# Patient Record
Sex: Female | Born: 1935 | Race: Black or African American | Hispanic: No | Smoking: Never smoker
Health system: Southern US, Community
[De-identification: ages and names within clinical notes are randomized; demographics above are authoritative.]

## PROBLEM LIST (undated history)

## (undated) DIAGNOSIS — K279 Peptic ulcer, site unspecified, unspecified as acute or chronic, without hemorrhage or perforation: Secondary | ICD-10-CM

## (undated) DIAGNOSIS — J449 Chronic obstructive pulmonary disease, unspecified: Secondary | ICD-10-CM

## (undated) DIAGNOSIS — I079 Rheumatic tricuspid valve disease, unspecified: Secondary | ICD-10-CM

## (undated) DIAGNOSIS — M1990 Unspecified osteoarthritis, unspecified site: Secondary | ICD-10-CM

## (undated) DIAGNOSIS — M069 Rheumatoid arthritis, unspecified: Secondary | ICD-10-CM

## (undated) DIAGNOSIS — I059 Rheumatic mitral valve disease, unspecified: Secondary | ICD-10-CM

## (undated) DIAGNOSIS — H409 Unspecified glaucoma: Secondary | ICD-10-CM

## (undated) HISTORY — DX: Unspecified glaucoma: H40.9

## (undated) HISTORY — DX: Rheumatic mitral valve disease, unspecified: I05.9

## (undated) HISTORY — DX: Chronic obstructive pulmonary disease, unspecified: J44.9

## (undated) HISTORY — DX: Unspecified osteoarthritis, unspecified site: M19.90

## (undated) HISTORY — DX: Rheumatoid arthritis, unspecified: M06.9

## (undated) HISTORY — DX: Peptic ulcer, site unspecified, unspecified as acute or chronic, without hemorrhage or perforation: K27.9

## (undated) HISTORY — DX: Rheumatic tricuspid valve disease, unspecified: I07.9

---

## 1969-05-20 HISTORY — PX: ABDOMINAL HYSTERECTOMY: SHX81

## 1979-01-19 HISTORY — PX: HAND SURGERY: SHX662

## 2001-06-22 ENCOUNTER — Ambulatory Visit (HOSPITAL_COMMUNITY): Admission: RE | Admit: 2001-06-22 | Discharge: 2001-06-22 | Payer: Self-pay | Admitting: Family Medicine

## 2001-06-22 ENCOUNTER — Encounter: Payer: Self-pay | Admitting: Family Medicine

## 2001-06-30 ENCOUNTER — Ambulatory Visit (HOSPITAL_COMMUNITY): Admission: RE | Admit: 2001-06-30 | Discharge: 2001-06-30 | Payer: Self-pay | Admitting: Internal Medicine

## 2002-07-26 ENCOUNTER — Encounter: Payer: Self-pay | Admitting: Family Medicine

## 2002-07-26 ENCOUNTER — Inpatient Hospital Stay (HOSPITAL_COMMUNITY): Admission: AD | Admit: 2002-07-26 | Discharge: 2002-07-28 | Payer: Self-pay | Admitting: Family Medicine

## 2002-07-26 ENCOUNTER — Ambulatory Visit (HOSPITAL_COMMUNITY): Admission: RE | Admit: 2002-07-26 | Discharge: 2002-07-26 | Payer: Self-pay | Admitting: Family Medicine

## 2002-10-07 ENCOUNTER — Ambulatory Visit (HOSPITAL_COMMUNITY): Admission: RE | Admit: 2002-10-07 | Discharge: 2002-10-07 | Payer: Self-pay | Admitting: Family Medicine

## 2002-10-07 ENCOUNTER — Encounter: Payer: Self-pay | Admitting: Family Medicine

## 2004-05-28 ENCOUNTER — Ambulatory Visit (HOSPITAL_COMMUNITY): Admission: RE | Admit: 2004-05-28 | Discharge: 2004-05-28 | Payer: Self-pay | Admitting: Family Medicine

## 2010-06-13 LAB — BASIC METABOLIC PANEL
BUN: 9 mg/dL (ref 6–23)
CO2: 28 mEq/L (ref 19–32)
Calcium: 9.6 mg/dL (ref 8.4–10.5)
Chloride: 102 mEq/L (ref 96–112)
Creatinine, Ser: 0.91 mg/dL (ref 0.4–1.2)
GFR calc Af Amer: >60 mL/min (ref >60)
GFR calc non Af Amer: >60 mL/min (ref >60)
Glucose, Bld: 106 mg/dL — ABNORMAL HIGH (ref 70–99)
Potassium: 3.7 mEq/L (ref 3.5–5.1)
Sodium: 137 mEq/L (ref 135–145)

## 2010-06-13 LAB — HEMOGLOBIN AND HEMATOCRIT, BLOOD
HCT: 40.6 % (ref 36.0–46.0)
Hemoglobin: 13.7 g/dL (ref 12.0–15.0)

## 2010-06-21 ENCOUNTER — Ambulatory Visit (HOSPITAL_COMMUNITY)
Admission: RE | Admit: 2010-06-21 | Discharge: 2010-06-21 | Disposition: A | Discharge status: Home / Self Care | Payer: Medicare Other | Attending: Ophthalmology | Admitting: Ophthalmology

## 2010-06-21 DIAGNOSIS — H2510 Age-related nuclear cataract, unspecified eye: Secondary | ICD-10-CM | POA: Insufficient documentation

## 2010-06-21 DIAGNOSIS — Z01818 Encounter for other preprocedural examination: Secondary | ICD-10-CM | POA: Insufficient documentation

## 2010-09-20 ENCOUNTER — Other Ambulatory Visit (HOSPITAL_COMMUNITY): Payer: Self-pay | Admitting: Family Medicine

## 2010-09-20 ENCOUNTER — Ambulatory Visit (HOSPITAL_COMMUNITY)
Admission: RE | Admit: 2010-09-20 | Discharge: 2010-09-20 | Disposition: A | Discharge status: Home / Self Care | Payer: Medicare Other | Source: Ambulatory Visit | Attending: Family Medicine | Admitting: Family Medicine

## 2010-09-20 DIAGNOSIS — M79609 Pain in unspecified limb: Secondary | ICD-10-CM | POA: Insufficient documentation

## 2010-09-20 DIAGNOSIS — R52 Pain, unspecified: Secondary | ICD-10-CM

## 2010-09-20 DIAGNOSIS — M899 Disorder of bone, unspecified: Secondary | ICD-10-CM | POA: Insufficient documentation

## 2010-09-20 DIAGNOSIS — M949 Disorder of cartilage, unspecified: Secondary | ICD-10-CM | POA: Insufficient documentation

## 2010-09-20 DIAGNOSIS — M25569 Pain in unspecified knee: Secondary | ICD-10-CM | POA: Insufficient documentation

## 2010-09-25 ENCOUNTER — Other Ambulatory Visit (HOSPITAL_COMMUNITY): Payer: Self-pay | Admitting: Family Medicine

## 2010-10-01 ENCOUNTER — Ambulatory Visit (HOSPITAL_COMMUNITY)
Admission: RE | Admit: 2010-10-01 | Discharge: 2010-10-01 | Disposition: A | Discharge status: Home / Self Care | Payer: Medicare Other | Source: Ambulatory Visit | Attending: Family Medicine | Admitting: Family Medicine

## 2010-10-01 DIAGNOSIS — M899 Disorder of bone, unspecified: Secondary | ICD-10-CM | POA: Insufficient documentation

## 2010-10-05 NOTE — Group Therapy Note (Signed)
   NAME:  Cheryl Swanson, Cheryl Swanson                          ACCOUNT NO.:  [id number]   MEDICAL RECORD NO.:  [id number]                   PATIENT TYPE:  INP   LOCATION:  A227                                 FACILITY:  [facility]   PHYSICIAN:  Mila Homer. Sudie Bailey, M.D.           DATE OF BIRTH:  01/16/1932   DATE OF PROCEDURE:  07/27/2002  DATE OF DISCHARGE:                                   PROGRESS NOTE   SUBJECTIVE:  The patient feels a lot better today.  She slept a lot better  last night.  She was admitted yesterday from the office with pneumonia.   OBJECTIVE:  She is supine in bed, no acute distress, well-developed but  thin.  She is oriented and alert.  Today, the temperature is 99.9, pulse 90,  respiratory rate 16, and blood pressure 93/57.  Heart has a regular rhythm,  rate of about 110 to my auscultation.  The lungs are clear throughout,  moving air well.  No intercostal retractions, no use of accessory muscles of  respirations.  She is actually off O2 at present.  The abdomen is soft  without hepatosplenomegaly or mass, no tenderness, and there is no edema of  the ankles.   ASSESSMENT:  Pneumonia, improved.   PLAN:  Discontinue IV, switch to Hep-Lock.  The patient to be up in a chair.  Recheck a CBC and MET-7 in the morning.  Continue the Rocephin 1 gram IV and  the Zithromax, now 250 mg p.o. daily, and hope to discharge home tomorrow.                                               Mila Homer. Sudie Bailey, M.D.    SDK/MEDQ  D:  07/27/2002  T:  07/27/2002  Job:  [id number]

## 2010-10-05 NOTE — Discharge Summary (Signed)
   NAME:  Cheryl Swanson, Cheryl Swanson                          ACCOUNT NO.:  [id number]   MEDICAL RECORD NO.:  [id number]                   PATIENT TYPE:  INP   LOCATION:  A227                                 FACILITY:  [facility]   PHYSICIAN:  Mila Homer. Sudie Bailey, M.D.           DATE OF BIRTH:  [birth year]   DATE OF ADMISSION:  07/26/2002  DATE OF DISCHARGE:                                 DISCHARGE SUMMARY   HISTORY OF PRESENT ILLNESS:  This 77 year old woman was admitted to the  hospital with pneumonia on 07/26/2002.  She had a benign 3-day  hospitalization.   She has had fever, chills and cough prior to hospitalization and was found  to have bilateral lower lobe infiltrates on an x-ray and a white cell count  of 19,600.   HOSPITAL COURSE:  In the hospital she was given Zithromax 500 mg immediately  followed by 250 mg p.o. daily and Rocephin 1 gm IV q.24h.  She was on an IV  of D5-1/2 normal saline at 75 mL an hour with albuterol nebulizer treatments  q.4h. while awake.  O2 at 2 liters a minute by nasal prong.  MV daily and  Tylenol 650 q.4h. p.r.n.   She did well on this regimen and was up around the second day and by the  third day was eating well, up and around, and did not need oxygen and felt  much stronger; and was ready for discharge home.   FINAL DISCHARGE DIAGNOSES:  1. Pneumonia.  2. Rheumatoid arthritis, stable on methotrexate weekly.  3. Hyperlipidemia.  4. Estrogen replacement.   DISCHARGE MEDICATIONS:  1. He was discharged home on Ceftin 250 b.i.d. for 7 days (14 no refills).  2. Zithromax 250 mg p.o. for 3 days (no refills).  3. She is to continue her Zocor, Vista, and methotrexate.   DISCHARGE INSTRUCTIONS:  Will follow up in the office in 5 days.                                               Mila Homer. Sudie Bailey, M.D.    SDK/MEDQ  D:  07/28/2002  T:  07/28/2002  Job:  [id number]

## 2010-10-05 NOTE — H&P (Signed)
   NAME:  Cheryl Swanson, LEUGERS                          ACCOUNT NO.:  [id number]   MEDICAL RECORD NO.:  [id number]                   PATIENT TYPE:  INP   LOCATION:  A227                                 FACILITY:  [facility]   PHYSICIAN:  Mila Homer. Sudie Bailey, M.D.           DATE OF BIRTH:  1931-10-21   DATE OF ADMISSION:  07/26/2002  DATE OF DISCHARGE:                                HISTORY & PHYSICAL   HISTORY OF PRESENT ILLNESS:  A 77 year old who presented to the office with  some coughing and dyspnea.  She had had problems sleeping the night before.   She had been seen in the office the week before with what looked like a  viral illness with a clear rhinorrhea and some upper respiratory symptoms.  She had been put on some antihistamine but had gotten worse.   She has a long history of rheumatoid arthritis for which she is currently on  methotrexate weekly.  This has controlled the arthritis.  She has a history  of peptic ulcer disease but turned out to be positive for H. pylori and that  was treated, and she has done well with this.   PHYSICAL EXAMINATION:  GENERAL:  Admission exam in the office showed a  pleasant elderly woman who looked very fatigued.  She had positive ________  sign.  She was oriented and alert.  HEART:  Regular rhythm, rate of about 120, no murmurs.  LUNGS:  Appeared clear throughout, moving air well.  There were no  intercostal retractions, no use of accessory muscles of respiration.  She  had no _________ or adenopathy.  HEENT:  Pharynx was normal.  There are negative skin nodes.  There is no  maxillary or frontal sinus pain.  ABDOMEN:  Soft without hepatosplenomegaly or mass.  EXTREMITIES:  There is no edema of the ankles.  SKIN:  Skin turgor appeared to be normal; mucous membranes slightly dry.   LABORATORY DATA:  Chest x-ray showed bilateral lower infiltrates.  White  cell count was 19,600.   ASSESSMENT:  1. Pneumonia.  2. Rheumatoid arthritis, stable on  methotrexate.   PLAN:  The plan of treatment is to admit the patient to the hospital on O2,  IV fluids, and Rocephin 1 gram IV q.24h. with Zithromax 500 mg immediately,  followed by 250 mg daily for four days.  She will be on Tylenol for fever.                                               Mila Homer. Sudie Bailey, M.D.    SDK/MEDQ  D:  07/27/2002  T:  07/27/2002  Job:  [id number]

## 2010-10-18 ENCOUNTER — Other Ambulatory Visit: Payer: Self-pay | Admitting: Anesthesiology

## 2010-10-18 ENCOUNTER — Encounter (HOSPITAL_COMMUNITY): Payer: Medicare Other

## 2010-10-18 LAB — BASIC METABOLIC PANEL
BUN: 14 mg/dL (ref 6–23)
CO2: 32 mEq/L (ref 19–32)
Chloride: 104 mEq/L (ref 96–112)
Creatinine, Ser: 0.85 mg/dL (ref 0.4–1.2)

## 2010-10-25 ENCOUNTER — Ambulatory Visit (HOSPITAL_COMMUNITY)
Admission: RE | Admit: 2010-10-25 | Discharge: 2010-10-25 | Disposition: A | Discharge status: Home / Self Care | Payer: Medicare Other | Source: Ambulatory Visit | Attending: Ophthalmology | Admitting: Ophthalmology

## 2010-10-25 DIAGNOSIS — Z01818 Encounter for other preprocedural examination: Secondary | ICD-10-CM | POA: Insufficient documentation

## 2010-10-25 DIAGNOSIS — Z01812 Encounter for preprocedural laboratory examination: Secondary | ICD-10-CM | POA: Insufficient documentation

## 2010-10-25 DIAGNOSIS — H2589 Other age-related cataract: Secondary | ICD-10-CM | POA: Insufficient documentation

## 2010-10-29 NOTE — Op Note (Signed)
  NAME:  Cheryl Swanson, CLINKSCALE NO.:  [id number]  MEDICAL RECORD NO.:  [id number]  LOCATION:  DAYP                          FACILITY:  [facility]  PHYSICIAN:  Susanne Greenhouse, MD       DATE OF BIRTH:  1932/03/26  DATE OF PROCEDURE:  10/25/2010 DATE OF DISCHARGE:                              OPERATIVE REPORT   PREOPERATIVE DIAGNOSIS:  Combined cataract, right eye.  Diagnosis code 366.19.  POSTOPERATIVE DIAGNOSIS:  Combined cataract, right eye.  Diagnosis code 366.19.  OPERATION PERFORMED:  Phacoemulsification with posterior chamber intraocular lens implantation, right eye.  SURGEON:  Bonne Dolores. Elchonon Maxson, MD  ANESTHESIA:  General endotracheal anesthesia.  OPERATIVE SUMMARY:  In the preoperative area, dilating drops were placed into the right eye.  The patient was then brought into the operating room where she was placed under general anesthesia.  The eye was then prepped and draped.  Beginning with a 75 blade, a paracentesis port was made at the surgeon's 2 o'clock position.  The anterior chamber was then filled with a 1% nonpreserved lidocaine solution with epinephrine.  This was followed by Viscoat to deepen the chamber.  A small fornix-based peritomy was performed superiorly.  Next, a single iris hook was placed through the limbus superiorly.  A 2.4-mm keratome blade was then used to make a clear corneal incision over the iris hook.  A bent cystotome needle and Utrata forceps were used to create a continuous tear capsulotomy.  Hydrodissection was performed using balanced salt solution on a fine cannula.  The lens nucleus was then removed using phacoemulsification in a quadrant cracking technique.  The cortical material was then removed with irrigation and aspiration.  The capsular bag and anterior chamber were refilled with Provisc.  The wound was widened to approximately 3 mm and a posterior chamber intraocular lens was placed into the capsular bag without difficulty using an  [employer] lens injecting system.  A single 10-0 nylon suture was then used to close the incision as well as stromal hydration.  The Provisc was removed from the anterior chamber and capsular bag with irrigation and aspiration.  At this point, the wounds were tested for leak, which were negative.  The anterior chamber remained deep and stable.  The patient tolerated the procedure well.  There were no operative complications, and she awoke from general anesthesia without problem.  Prosthetic device used is a Lenstec posterior chamber lens, model Softec HD, power of 19.0, serial number is [id number].          ______________________________ Susanne Greenhouse, MD     KEH/MEDQ  D:  10/25/2010  T:  10/26/2010  Job:  [id number]  Electronically Signed by Gemma Payor MD on 10/29/2010 12:22:53 PM

## 2011-02-21 ENCOUNTER — Other Ambulatory Visit (HOSPITAL_COMMUNITY): Payer: Self-pay | Admitting: Family Medicine

## 2011-02-21 ENCOUNTER — Ambulatory Visit (HOSPITAL_COMMUNITY)
Admission: RE | Admit: 2011-02-21 | Discharge: 2011-02-21 | Disposition: A | Discharge status: Home / Self Care | Payer: Medicare Other | Source: Ambulatory Visit | Attending: Family Medicine | Admitting: Family Medicine

## 2011-02-21 DIAGNOSIS — M25569 Pain in unspecified knee: Secondary | ICD-10-CM | POA: Insufficient documentation

## 2011-02-21 DIAGNOSIS — M25561 Pain in right knee: Secondary | ICD-10-CM

## 2011-02-21 DIAGNOSIS — M25562 Pain in left knee: Secondary | ICD-10-CM

## 2011-02-21 DIAGNOSIS — M25469 Effusion, unspecified knee: Secondary | ICD-10-CM | POA: Insufficient documentation

## 2011-10-15 ENCOUNTER — Other Ambulatory Visit (HOSPITAL_COMMUNITY): Payer: Self-pay | Admitting: Family Medicine

## 2011-10-15 ENCOUNTER — Ambulatory Visit (HOSPITAL_COMMUNITY)
Admission: RE | Admit: 2011-10-15 | Discharge: 2011-10-15 | Disposition: A | Discharge status: Home / Self Care | Payer: Medicare Other | Source: Ambulatory Visit | Attending: Family Medicine | Admitting: Family Medicine

## 2011-10-15 DIAGNOSIS — I059 Rheumatic mitral valve disease, unspecified: Secondary | ICD-10-CM

## 2011-10-15 DIAGNOSIS — R5381 Other malaise: Secondary | ICD-10-CM | POA: Insufficient documentation

## 2011-10-15 DIAGNOSIS — R5383 Other fatigue: Secondary | ICD-10-CM | POA: Insufficient documentation

## 2011-10-15 DIAGNOSIS — I079 Rheumatic tricuspid valve disease, unspecified: Secondary | ICD-10-CM | POA: Insufficient documentation

## 2011-10-15 NOTE — Progress Notes (Signed)
*  PRELIMINARY RESULTS* Echocardiogram 2D Echocardiogram has been performed.  Cheryl Swanson 10/15/2011, 1:06 PM

## 2012-03-19 ENCOUNTER — Other Ambulatory Visit (HOSPITAL_COMMUNITY): Payer: Self-pay | Admitting: Family Medicine

## 2012-03-19 ENCOUNTER — Ambulatory Visit (HOSPITAL_COMMUNITY)
Admission: RE | Admit: 2012-03-19 | Discharge: 2012-03-19 | Disposition: A | Discharge status: Home / Self Care | Payer: Medicare Other | Source: Ambulatory Visit | Attending: Family Medicine | Admitting: Family Medicine

## 2012-03-19 DIAGNOSIS — M1710 Unilateral primary osteoarthritis, unspecified knee: Secondary | ICD-10-CM | POA: Insufficient documentation

## 2012-03-19 DIAGNOSIS — M25562 Pain in left knee: Secondary | ICD-10-CM

## 2012-03-19 DIAGNOSIS — M25469 Effusion, unspecified knee: Secondary | ICD-10-CM | POA: Insufficient documentation

## 2012-04-07 ENCOUNTER — Other Ambulatory Visit (HOSPITAL_COMMUNITY): Payer: Self-pay | Admitting: Family Medicine

## 2012-04-07 ENCOUNTER — Ambulatory Visit (HOSPITAL_COMMUNITY)
Admission: RE | Admit: 2012-04-07 | Discharge: 2012-04-07 | Disposition: A | Discharge status: Home / Self Care | Payer: Medicare Other | Source: Ambulatory Visit | Attending: Family Medicine | Admitting: Family Medicine

## 2012-04-07 DIAGNOSIS — J449 Chronic obstructive pulmonary disease, unspecified: Secondary | ICD-10-CM | POA: Insufficient documentation

## 2012-04-07 DIAGNOSIS — R5383 Other fatigue: Secondary | ICD-10-CM

## 2012-04-07 DIAGNOSIS — J4489 Other specified chronic obstructive pulmonary disease: Secondary | ICD-10-CM | POA: Insufficient documentation

## 2012-04-07 DIAGNOSIS — R5381 Other malaise: Secondary | ICD-10-CM | POA: Insufficient documentation

## 2012-04-08 ENCOUNTER — Other Ambulatory Visit (HOSPITAL_COMMUNITY): Payer: Self-pay | Admitting: Family Medicine

## 2012-04-08 DIAGNOSIS — R5383 Other fatigue: Secondary | ICD-10-CM

## 2012-04-08 DIAGNOSIS — R9389 Abnormal findings on diagnostic imaging of other specified body structures: Secondary | ICD-10-CM

## 2012-04-09 ENCOUNTER — Encounter (INDEPENDENT_AMBULATORY_CARE_PROVIDER_SITE_OTHER): Payer: Self-pay

## 2012-04-14 ENCOUNTER — Ambulatory Visit (HOSPITAL_COMMUNITY)
Admission: RE | Admit: 2012-04-14 | Discharge: 2012-04-14 | Disposition: A | Discharge status: Home / Self Care | Payer: Medicare Other | Source: Ambulatory Visit | Attending: Family Medicine | Admitting: Family Medicine

## 2012-04-14 DIAGNOSIS — J438 Other emphysema: Secondary | ICD-10-CM | POA: Insufficient documentation

## 2012-04-14 DIAGNOSIS — R918 Other nonspecific abnormal finding of lung field: Secondary | ICD-10-CM | POA: Insufficient documentation

## 2012-04-14 DIAGNOSIS — R5381 Other malaise: Secondary | ICD-10-CM | POA: Insufficient documentation

## 2012-04-14 DIAGNOSIS — R9389 Abnormal findings on diagnostic imaging of other specified body structures: Secondary | ICD-10-CM

## 2012-04-14 DIAGNOSIS — R5383 Other fatigue: Secondary | ICD-10-CM

## 2012-04-14 MED ORDER — IOHEXOL 300 MG/ML  SOLN
80.0000 mL | Freq: Once | INTRAMUSCULAR | Status: AC | PRN
  Administered 2012-04-14: 80 mL via INTRAVENOUS

## 2012-04-22 ENCOUNTER — Encounter (INDEPENDENT_AMBULATORY_CARE_PROVIDER_SITE_OTHER): Payer: Self-pay

## 2012-06-19 ENCOUNTER — Other Ambulatory Visit (HOSPITAL_COMMUNITY): Payer: Self-pay | Admitting: Family Medicine

## 2012-06-19 ENCOUNTER — Ambulatory Visit (HOSPITAL_COMMUNITY)
Admission: RE | Admit: 2012-06-19 | Discharge: 2012-06-19 | Disposition: A | Discharge status: Home / Self Care | Payer: Medicare Other | Source: Ambulatory Visit | Attending: Family Medicine | Admitting: Family Medicine

## 2012-06-19 DIAGNOSIS — M79604 Pain in right leg: Secondary | ICD-10-CM

## 2012-06-19 DIAGNOSIS — M79609 Pain in unspecified limb: Secondary | ICD-10-CM | POA: Insufficient documentation

## 2012-08-20 ENCOUNTER — Other Ambulatory Visit (HOSPITAL_COMMUNITY): Payer: Self-pay | Admitting: Podiatry

## 2012-08-20 DIAGNOSIS — M79605 Pain in left leg: Secondary | ICD-10-CM

## 2012-08-24 ENCOUNTER — Ambulatory Visit (HOSPITAL_COMMUNITY)
Admission: RE | Admit: 2012-08-24 | Discharge: 2012-08-24 | Disposition: A | Discharge status: Home / Self Care | Payer: Medicare Other | Source: Ambulatory Visit | Attending: Podiatry | Admitting: Podiatry

## 2012-08-24 DIAGNOSIS — M79609 Pain in unspecified limb: Secondary | ICD-10-CM | POA: Insufficient documentation

## 2012-08-24 DIAGNOSIS — M79602 Pain in left arm: Secondary | ICD-10-CM

## 2012-10-28 ENCOUNTER — Encounter: Payer: Self-pay | Admitting: Cardiovascular Disease

## 2012-10-28 ENCOUNTER — Ambulatory Visit (INDEPENDENT_AMBULATORY_CARE_PROVIDER_SITE_OTHER): Payer: Medicare Other | Admitting: Cardiovascular Disease

## 2012-10-28 ENCOUNTER — Ambulatory Visit (HOSPITAL_COMMUNITY)
Admission: RE | Admit: 2012-10-28 | Discharge: 2012-10-28 | Disposition: A | Discharge status: Home / Self Care | Payer: Medicare Other | Source: Ambulatory Visit | Attending: Cardiovascular Disease | Admitting: Cardiovascular Disease

## 2012-10-28 VITALS — BP 128/76 | HR 67 | Resp 16 | Ht 66.0 in | Wt 119.5 lb

## 2012-10-28 DIAGNOSIS — R5381 Other malaise: Secondary | ICD-10-CM

## 2012-10-28 DIAGNOSIS — I4589 Other specified conduction disorders: Secondary | ICD-10-CM

## 2012-10-28 DIAGNOSIS — R5383 Other fatigue: Secondary | ICD-10-CM

## 2012-10-28 DIAGNOSIS — M069 Rheumatoid arthritis, unspecified: Secondary | ICD-10-CM

## 2012-10-29 ENCOUNTER — Encounter: Payer: Self-pay | Admitting: Cardiovascular Disease

## 2012-10-29 DIAGNOSIS — R5382 Chronic fatigue, unspecified: Secondary | ICD-10-CM | POA: Insufficient documentation

## 2012-10-29 DIAGNOSIS — R5383 Other fatigue: Secondary | ICD-10-CM | POA: Insufficient documentation

## 2012-10-29 DIAGNOSIS — M069 Rheumatoid arthritis, unspecified: Secondary | ICD-10-CM | POA: Insufficient documentation

## 2012-10-29 NOTE — Progress Notes (Signed)
Patient ID: Cheryl Swanson, female   DOB: 1931/09/29, 79 y.o.   MRN: [id number]     Reason for office visit Worsening fatigue  Cheryl Swanson is almost 79 years old and has had complaints of gradually worsening fatigue for at least a month. She refers to her condition as feeling "tired all the time". She states that she is tired sooner she wakes up in the morning. She does not describe shortness of breath chest tightness, dizziness, lightheadedness, syncope or other focal nonfocal deficits.  Roughly one year ago she underwent an echocardiogram that showed normal left ventricular size and systolic function with normal regional wall motion and no significant valvular abnormalities. The pulmonary artery pressure was normal there was only mild tricuspid insufficiency. Note is made of grade 1 diastolic dysfunction but upon review of the echocardiogram I believe the Doppler parameters are actually within normal range for an octogenarian.  She is on chronic methotrexate therapy for rheumatoid arthritis and states that she has routine laboratory testing and that she is not anemic. She does not appear to be pale. I do not have access to recent laboratory tests.   No Known Allergies  Current Outpatient Prescriptions  Medication Sig Dispense Refill  . alendronate (FOSAMAX) 70 MG tablet Take 1 tablet by mouth once a week.      . calcium-vitamin D (OSCAL WITH D) 500-200 MG-UNIT per tablet Take 1 tablet by mouth 2 (two) times daily.      . Cholecalciferol (VITAMIN D3) 2000 UNITS TABS Take 1 capsule by mouth daily.      Marland Kitchen gabapentin (NEURONTIN) 300 MG capsule Take 1 capsule by mouth 2 (two) times daily.      . megestrol (MEGACE) 40 MG/ML suspension Take 10 mLs by mouth daily.      . methotrexate (RHEUMATREX) 2.5 MG tablet Take 8 tablets by mouth once a week.      . simvastatin (ZOCOR) 20 MG tablet Take 1 tablet by mouth daily.      . TRAVATAN Z 0.004 % SOLN ophthalmic solution Place 1 drop into both eyes at  bedtime.       No current facility-administered medications for this visit.    Past Medical History  Diagnosis Date  . Rheumatoid arthritis(714.0)   . Peptic ulcer   . Glaucoma   . Mitral valve disorder   . Tricuspid valve disorder     Past Surgical History  Procedure Laterality Date  . Abdominal hysterectomy  1971  . Hand surgery  1980's    left    Family History  Problem Relation Age of Onset  . Cancer Mother   . Cancer Father   . Cancer Brother   . Cancer Brother   . Cancer Son     prostate-remission    History   Social History  . Marital Status: Widowed    Spouse Name: N/A    Number of Children: N/A  . Years of Education: N/A   Occupational History  . Not on file.   Social History Main Topics  . Smoking status: Never Smoker   . Smokeless tobacco: Not on file  . Alcohol Use: No  . Drug Use: No  . Sexually Active: Not on file   Other Topics Concern  . Not on file   Social History Narrative  . No narrative on file    Review of systems: The patient specifically denies any chest pain at rest or with exertion, dyspnea at rest or with exertion, orthopnea, paroxysmal  nocturnal dyspnea, syncope, palpitations, focal neurological deficits, intermittent claudication, lower extremity edema, unexplained weight gain, cough, hemoptysis or wheezing.  The patient also denies abdominal pain, nausea, vomiting, dysphagia, diarrhea, constipation, polyuria, polydipsia, dysuria, hematuria, frequency, urgency, abnormal bleeding or bruising, fever, chills, unexpected weight changes, mood swings, change in skin or hair texture, change in voice quality, auditory or visual problems, allergic reactions or rashes, new musculoskeletal complaints other than usual "aches and pains". She has chronic stiffness and reduced range of motion in the joints of both hands.   PHYSICAL EXAM BP 128/76  Pulse 67  Resp 16  Ht 5\' 6"  (1.676 m)  Wt 119 lb 8 oz (54.205 kg)  BMI 19.3  kg/m2  General: Alert, oriented x3, no distress, she is very slim but states that she has been this way her whole life Head: no evidence of trauma, PERRL, EOMI, no exophtalmos or lid lag, no myxedema, no xanthelasma; normal ears, nose and oropharynx Neck: normal jugular venous pulsations and no hepatojugular reflux; brisk carotid pulses without delay and no carotid bruits Chest: clear to auscultation, no signs of consolidation by percussion or palpation, normal fremitus, symmetrical and full respiratory excursions Cardiovascular: normal position and quality of the apical impulse, regular rhythm, normal first and second heart sounds, no murmurs, rubs or gallops; she has a barely audible pulses: Murmur at the left lower sternal border Abdomen: no tenderness or distention, no masses by palpation, no abnormal pulsatility or arterial bruits, normal bowel sounds, no hepatosplenomegaly Extremities: no clubbing, cyanosis or edema; 2+ radial, ulnar and brachial pulses bilaterally; 2+ right femoral, posterior tibial and dorsalis pedis pulses; 2+ left femoral, posterior tibial and dorsalis pedis pulses; no subclavian or femoral bruits. She has very prominent chronic deformity of the metacarpophalangeal and proximal interphalangeal joints in both hands with ulnar deviation Neurological: grossly nonfocal   EKG: Normal sinus rhythm, normal tracing. There is borderline high-voltage and I believe this is because she is so slender.  Lipid Panel  No results found for this basename: chol, trig, hdl, cholhdl, vldl, ldlcalc    BMET    Component Value Date/Time   NA 140 10/18/2010 1131   K 4.4 10/18/2010 1131   CL 104 10/18/2010 1131   CO2 32 10/18/2010 1131   GLUCOSE 95 10/18/2010 1131   BUN 14 10/18/2010 1131   CREATININE 0.85 10/18/2010 1131   CALCIUM 10.7* 10/18/2010 1131   GFRNONAA >60 10/18/2010 1131   GFRAA  Value: >60        The eGFR has been calculated using the MDRD equation. This calculation has not been  validated in all clinical situations. eGFR's persistently <60 mL/min signify possible Chronic Kidney Disease. 10/18/2010 1131     ASSESSMENT AND PLAN  By clinical exam, fairly recent echocardiogram and ECG there is no evidence to support structural heart disease as a cause of her fatigue.  On the other hand at her age it is possible that she may have chronotropic incompetence as a cause for fatigue. In order to evaluate this we performed an in office simple treadmill stress test. The patient was easily able to increase her heart rate to over 120 beats per minute, therefore clearly not suffering from a tendency to bradycardia. She had a very appropriate response to activity and actually her heart rate increased a little rapidly suggestive of some degree of deconditioning or possibly anemia.  Assuming that her hemoglobin is indeed normal I do not have a clear explanation for her fatigue. I doubt that it  is however related to her cardiovascular problem. I would venture to say that her rheumatological condition and possibly the immunosuppressive therapy for it are the most likely causes for her complaints.  Orders Placed This Encounter  Procedures  . EKG 12-Lead  . Exercise tolerance test   Meds ordered this encounter  Medications  . methotrexate (RHEUMATREX) 2.5 MG tablet    Sig: Take 8 tablets by mouth once a week.  . simvastatin (ZOCOR) 20 MG tablet    Sig: Take 1 tablet by mouth daily.  Marland Kitchen gabapentin (NEURONTIN) 300 MG capsule    Sig: Take 1 capsule by mouth 2 (two) times daily.  . megestrol (MEGACE) 40 MG/ML suspension    Sig: Take 10 mLs by mouth daily.  . TRAVATAN Z 0.004 % SOLN ophthalmic solution    Sig: Place 1 drop into both eyes at bedtime.  Marland Kitchen alendronate (FOSAMAX) 70 MG tablet    Sig: Take 1 tablet by mouth once a week.  . calcium-vitamin D (OSCAL WITH D) 500-200 MG-UNIT per tablet    Sig: Take 1 tablet by mouth 2 (two) times daily.  . Cholecalciferol (VITAMIN D3) 2000 UNITS  TABS    Sig: Take 1 capsule by mouth daily.    Junious Silk, MD, [facility] [phone] office [phone] pager

## 2012-10-30 NOTE — Procedures (Signed)
The patient exercised on a modified Bruce protocol in order to evaluate for signs of chronotropic incompetence.   The baseline heart rate was 72 beats per minutes. The baseline blood pressure is 140/78 mm Hg. The blood pressure peak exercise is 159 of 102 mm Hg.  She achieved a maximum workload of 2.1 minutes. The total exercise time was 2 minutes and 30 seconds. She achieved a peak heart rate of 131 beats per minute representing 92% of the maximum predicted heart rate for age.   The patient did not complain of shortness of breath or chest pain during activity but had severe limited mobility due to arthritis.  No ST segment changes are noticed during exercise or recovery.  Normal stress test. Poor exercise tolerance. No evidence of chronotropic incompetence or coronary insufficiency.

## 2013-03-01 ENCOUNTER — Ambulatory Visit: Payer: Self-pay | Admitting: Podiatry

## 2013-11-18 ENCOUNTER — Other Ambulatory Visit (HOSPITAL_COMMUNITY): Payer: Self-pay | Admitting: Family Medicine

## 2013-11-18 ENCOUNTER — Ambulatory Visit (HOSPITAL_COMMUNITY)
Admission: RE | Admit: 2013-11-18 | Discharge: 2013-11-18 | Disposition: A | Discharge status: Home / Self Care | Payer: Medicare Other | Source: Ambulatory Visit | Attending: Family Medicine | Admitting: Family Medicine

## 2013-11-18 DIAGNOSIS — J438 Other emphysema: Secondary | ICD-10-CM | POA: Insufficient documentation

## 2013-11-18 DIAGNOSIS — R0602 Shortness of breath: Secondary | ICD-10-CM

## 2014-05-17 ENCOUNTER — Other Ambulatory Visit (HOSPITAL_COMMUNITY): Payer: Self-pay | Admitting: Family Medicine

## 2014-05-17 DIAGNOSIS — E559 Vitamin D deficiency, unspecified: Secondary | ICD-10-CM

## 2014-05-17 DIAGNOSIS — M069 Rheumatoid arthritis, unspecified: Secondary | ICD-10-CM

## 2014-05-24 ENCOUNTER — Ambulatory Visit (HOSPITAL_COMMUNITY)
Admission: RE | Admit: 2014-05-24 | Discharge: 2014-05-24 | Disposition: A | Discharge status: Home / Self Care | Payer: Medicare Other | Source: Ambulatory Visit | Attending: Family Medicine | Admitting: Family Medicine

## 2014-05-24 DIAGNOSIS — E559 Vitamin D deficiency, unspecified: Secondary | ICD-10-CM

## 2014-05-24 DIAGNOSIS — Z780 Asymptomatic menopausal state: Secondary | ICD-10-CM | POA: Diagnosis not present

## 2014-05-24 DIAGNOSIS — M8580 Other specified disorders of bone density and structure, unspecified site: Secondary | ICD-10-CM | POA: Insufficient documentation

## 2014-05-24 DIAGNOSIS — M069 Rheumatoid arthritis, unspecified: Secondary | ICD-10-CM | POA: Diagnosis not present

## 2014-05-24 DIAGNOSIS — Z90711 Acquired absence of uterus with remaining cervical stump: Secondary | ICD-10-CM | POA: Insufficient documentation

## 2014-05-24 DIAGNOSIS — Z13820 Encounter for screening for osteoporosis: Secondary | ICD-10-CM | POA: Diagnosis not present

## 2014-06-30 ENCOUNTER — Encounter (HOSPITAL_COMMUNITY)
Admission: RE | Admit: 2014-06-30 | Discharge: 2014-06-30 | Disposition: A | Discharge status: Home / Self Care | Payer: Medicare Other | Source: Ambulatory Visit | Attending: Family Medicine | Admitting: Family Medicine

## 2014-06-30 DIAGNOSIS — M810 Age-related osteoporosis without current pathological fracture: Secondary | ICD-10-CM | POA: Diagnosis not present

## 2014-06-30 MED ORDER — ZOLEDRONIC ACID 5 MG/100ML IV SOLN
5.0000 mg | Freq: Once | INTRAVENOUS | Status: AC
  Administered 2014-06-30: 5 mg via INTRAVENOUS
  Filled 2014-06-30: qty 100

## 2015-07-13 ENCOUNTER — Encounter (HOSPITAL_COMMUNITY): Payer: Self-pay | Admitting: Emergency Medicine

## 2015-07-13 ENCOUNTER — Emergency Department (HOSPITAL_COMMUNITY): Payer: Medicare Other

## 2015-07-13 ENCOUNTER — Emergency Department (HOSPITAL_COMMUNITY)
Admission: EM | Admit: 2015-07-13 | Discharge: 2015-07-13 | Disposition: A | Discharge status: Home / Self Care | Payer: Medicare Other | Attending: Emergency Medicine | Admitting: Emergency Medicine

## 2015-07-13 DIAGNOSIS — Z79899 Other long term (current) drug therapy: Secondary | ICD-10-CM | POA: Diagnosis not present

## 2015-07-13 DIAGNOSIS — R000 Tachycardia, unspecified: Secondary | ICD-10-CM | POA: Diagnosis not present

## 2015-07-13 DIAGNOSIS — Z8679 Personal history of other diseases of the circulatory system: Secondary | ICD-10-CM | POA: Insufficient documentation

## 2015-07-13 DIAGNOSIS — R059 Cough, unspecified: Secondary | ICD-10-CM

## 2015-07-13 DIAGNOSIS — R0981 Nasal congestion: Secondary | ICD-10-CM | POA: Diagnosis not present

## 2015-07-13 DIAGNOSIS — Z8711 Personal history of peptic ulcer disease: Secondary | ICD-10-CM | POA: Insufficient documentation

## 2015-07-13 DIAGNOSIS — R05 Cough: Secondary | ICD-10-CM | POA: Insufficient documentation

## 2015-07-13 DIAGNOSIS — R079 Chest pain, unspecified: Secondary | ICD-10-CM | POA: Diagnosis not present

## 2015-07-13 DIAGNOSIS — J392 Other diseases of pharynx: Secondary | ICD-10-CM | POA: Insufficient documentation

## 2015-07-13 DIAGNOSIS — G8929 Other chronic pain: Secondary | ICD-10-CM | POA: Diagnosis not present

## 2015-07-13 DIAGNOSIS — H409 Unspecified glaucoma: Secondary | ICD-10-CM | POA: Diagnosis not present

## 2015-07-13 DIAGNOSIS — M069 Rheumatoid arthritis, unspecified: Secondary | ICD-10-CM | POA: Diagnosis not present

## 2015-07-13 LAB — INFLUENZA PANEL BY PCR (TYPE A & B)
H1N1FLUPCR: NOT DETECTED
INFLBPCR: POSITIVE — AB
Influenza A By PCR: NEGATIVE

## 2015-07-13 MED ORDER — ALBUTEROL SULFATE (2.5 MG/3ML) 0.083% IN NEBU
5.0000 mg | INHALATION_SOLUTION | Freq: Once | RESPIRATORY_TRACT | Status: AC
  Administered 2015-07-13: 5 mg via RESPIRATORY_TRACT
  Filled 2015-07-13: qty 6

## 2015-07-13 MED ORDER — ALBUTEROL SULFATE HFA 108 (90 BASE) MCG/ACT IN AERS: 2.0000 | INHALATION_SPRAY | RESPIRATORY_TRACT | Status: DC | PRN

## 2015-07-13 MED ORDER — BENZONATATE 100 MG PO CAPS: 100.0000 mg | ORAL_CAPSULE | Freq: Three times a day (TID) | ORAL | Status: DC

## 2015-07-13 MED ORDER — BENZONATATE 100 MG PO CAPS
100.0000 mg | ORAL_CAPSULE | Freq: Once | ORAL | Status: AC
  Administered 2015-07-13: 100 mg via ORAL
  Filled 2015-07-13: qty 1

## 2015-07-13 NOTE — ED Notes (Signed)
Called lab to acquire about flu culture. Lab states their hood has been under maintenance and flu has not been started (however, has been marked in progress). MD notified and family notified of situation. MD to see pt.

## 2015-07-13 NOTE — ED Notes (Signed)
Patient with no complaints at this time. Respirations even and unlabored. Skin warm/dry. Discharge instructions reviewed with patient at this time. Patient given opportunity to voice concerns/ask questions. Patient discharged at this time and left Emergency Department with steady gait.   

## 2015-07-13 NOTE — ED Provider Notes (Signed)
CSN: [id number]     Arrival date & time 07/13/15  1036 History   First MD Initiated Contact with Patient 07/13/15 1046     Chief Complaint  Patient presents with  . Cough     (Consider location/radiation/quality/duration/timing/severity/associated sxs/prior Treatment) HPI Comments: The pt is an 82 y/o female - she has RA and has had 5 days of coughing and now with some congestion.  She reports that she has not had any swelling, there is no new body aches but she does have some chronic pain related to her rheumatoid arthritis. There is some pain that goes across her lower chest on the left and the right and seems to get worse when she coughs. She states that the cough is occasionally productive, denies back pain, neck pain, swelling of the legs, no medications given prior to arrival  Patient is a 82 y.o. female presenting with cough. The history is provided by the patient.  Cough   Past Medical History  Diagnosis Date  . Rheumatoid arthritis(714.0)   . Peptic ulcer   . Glaucoma   . Mitral valve disorder   . Tricuspid valve disorder    Past Surgical History  Procedure Laterality Date  . Abdominal hysterectomy  1971  . Hand surgery  1980's    left   Family History  Problem Relation Age of Onset  . Cancer Mother   . Cancer Father   . Cancer Brother   . Cancer Brother   . Cancer Son     prostate-remission   Social History  Substance Use Topics  . Smoking status: Never Smoker   . Smokeless tobacco: None  . Alcohol Use: No   OB History    No data available     Review of Systems  Respiratory: Positive for cough.   All other systems reviewed and are negative.     Allergies  Review of patient's allergies indicates no known allergies.  Home Medications   Prior to Admission medications   Medication Sig Start Date End Date Taking? Authorizing Provider  alendronate (FOSAMAX) 70 MG tablet Take 1 tablet by mouth once a week. 10/19/12  Yes Historical Provider, MD   calcium-vitamin D (OSCAL WITH D) 500-200 MG-UNIT per tablet Take 1 tablet by mouth 2 (two) times daily.   Yes Historical Provider, MD  Cholecalciferol (VITAMIN D3) 2000 UNITS TABS Take 1 capsule by mouth daily.   Yes Historical Provider, MD  ibuprofen (ADVIL,MOTRIN) 200 MG tablet Take 400 mg by mouth every 6 (six) hours as needed.   Yes Historical Provider, MD  methotrexate (RHEUMATREX) 2.5 MG tablet Take 8 tablets by mouth once a week. 10/05/12  Yes Historical Provider, MD  simvastatin (ZOCOR) 20 MG tablet Take 1 tablet by mouth daily. 10/05/12  Yes Historical Provider, MD  TRAVATAN Z 0.004 % SOLN ophthalmic solution Place 1 drop into both eyes at bedtime. 10/05/12  Yes Historical Provider, MD  benzonatate (TESSALON) 100 MG capsule Take 1 capsule (100 mg total) by mouth every 8 (eight) hours. 07/13/15   Eber Hong, MD   BP 112/71 mmHg  Pulse 94  Temp(Src) 98.6 F (37 C) (Oral)  Resp 22  Ht 5\' 6"  (1.676 m)  Wt 123 lb (55.792 kg)  BMI 19.86 kg/m2  SpO2 94% Physical Exam  Constitutional: She appears well-developed and well-nourished. No distress.  HENT:  Head: Normocephalic and atraumatic.  Mouth/Throat: Oropharynx is clear and moist. No oropharyngeal exudate.  Posterior pharynx mildly erythematous  Eyes: Conjunctivae and EOM are  normal. Pupils are equal, round, and reactive to light. Right eye exhibits no discharge. Left eye exhibits no discharge. No scleral icterus.  Neck: Normal range of motion. Neck supple. No JVD present. No thyromegaly present.  Cardiovascular: Regular rhythm, normal heart sounds and intact distal pulses.  Exam reveals no gallop and no friction rub.   No murmur heard. Mild tachycardia  Pulmonary/Chest: Effort normal and breath sounds normal. No respiratory distress. She has no wheezes. She has no rales.  Abdominal: Soft. Bowel sounds are normal. She exhibits no distension and no mass. There is no tenderness.  Musculoskeletal: Normal range of motion. She exhibits no  edema or tenderness.  Lymphadenopathy:    She has no cervical adenopathy.  Neurological: She is alert. Coordination normal.  Skin: Skin is warm and dry. No rash noted. No erythema.  Psychiatric: She has a normal mood and affect. Her behavior is normal.  Nursing note and vitals reviewed.   ED Course  Procedures (including critical care time) Labs Review Labs Reviewed  INFLUENZA PANEL BY PCR (TYPE A & B, H1N1)    Imaging Review Dg Chest 2 View  07/13/2015  CLINICAL DATA:  Bilateral rib pain, cough and congestion starting Sunday night, no known injury EXAM: CHEST  2 VIEW COMPARISON:  11/18/2013 FINDINGS: Cardiomediastinal silhouette is stable. Stable hyperinflation. Minimal perihilar increased bronchial markings without focal consolidation. No segmental infiltrate or pulmonary edema. Bony thorax is unremarkable. IMPRESSION: Hyperinflation again noted. No acute infiltrate or pulmonary edema. Minimal perihilar increased bronchial markings without focal consolidation. Electronically Signed   By: Natasha Mead M.D.   On: 07/13/2015 11:32   I have personally reviewed and evaluated these images and lab results as part of my medical decision-making.   EKG Interpretation   Date/Time:  Thursday July 13 2015 10:50:34 EST Ventricular Rate:  106 PR Interval:  165 QRS Duration: 92 QT Interval:  340 QTC Calculation: 451 R Axis:   77 Text Interpretation:  Sinus tachycardia Biatrial enlargement Abnrm T,  consider ischemia, anterolateral lds ST elevation, consider inferior  injury Baseline wander in lead(s) II III aVL aVF V6 Since last tracing  rate faster Confirmed by Henleigh Robello  MD, Lanique Gonzalo ([id number]) on 07/13/2015 11:12:51  AM      MDM   Final diagnoses:  Cough    The patient does have some signs of rheumatoid arthritis from her joints however these are chronic findings, she has normal vital signs other than oxygen of 94%. She has normal lung sounds however I will obtain a chest x-ray and a flu  swab to rule out sources of her cough during this busy flu season. She does not appear in distress, she speaks in full sentences, this is not fluid overloaded.  Chest x-rays unremarkable, influenza sample was not run because the lab was unable to run a sample in a timely manner, the patient's vital signs have stayed in a remarkably normal range, her lungs sound normal at this time, she was informed of her x-ray results, she had no change on her EKG, she is more than 5 days into her symptoms thus the flu medication would not be beneficial  Meds given in ED:  Medications  benzonatate (TESSALON) capsule 100 mg (not administered)  albuterol (PROVENTIL) (2.5 MG/3ML) 0.083% nebulizer solution 5 mg (not administered)    New Prescriptions   BENZONATATE (TESSALON) 100 MG CAPSULE    Take 1 capsule (100 mg total) by mouth every 8 (eight) hours.        Arlys John  Hyacinth Meeker, MD 07/13/15 1335

## 2015-07-13 NOTE — Discharge Instructions (Signed)
Your chest x-ray did not show any signs of pneumonia, please take the albuterol inhaler 2 puffs every 4 hours as needed for shortness of breath or coughing, Tessalon every 8 hours as needed for coughing, follow-up with your doctor within 2 days for a recheck or return to the emergency department for severe symptoms

## 2015-07-13 NOTE — ED Notes (Signed)
Pt reports bilateral rib pain,h/a,  cough, and congestion since Sunday night.  Pt alert and oriented.

## 2015-07-13 NOTE — ED Notes (Signed)
Patient given ginger ale per request. Updated on pending flu culture.

## 2018-02-24 ENCOUNTER — Other Ambulatory Visit (HOSPITAL_COMMUNITY): Payer: Self-pay | Admitting: Family Medicine

## 2018-02-24 DIAGNOSIS — R0600 Dyspnea, unspecified: Secondary | ICD-10-CM

## 2018-03-03 ENCOUNTER — Ambulatory Visit (HOSPITAL_COMMUNITY)
Admission: RE | Admit: 2018-03-03 | Discharge: 2018-03-03 | Disposition: A | Discharge status: Home / Self Care | Payer: Medicare Other | Source: Ambulatory Visit | Attending: Family Medicine | Admitting: Family Medicine

## 2018-03-03 DIAGNOSIS — Z8711 Personal history of peptic ulcer disease: Secondary | ICD-10-CM | POA: Insufficient documentation

## 2018-03-03 DIAGNOSIS — R0600 Dyspnea, unspecified: Secondary | ICD-10-CM | POA: Insufficient documentation

## 2018-03-03 DIAGNOSIS — M069 Rheumatoid arthritis, unspecified: Secondary | ICD-10-CM | POA: Diagnosis not present

## 2018-03-03 DIAGNOSIS — I080 Rheumatic disorders of both mitral and aortic valves: Secondary | ICD-10-CM | POA: Diagnosis not present

## 2018-03-03 NOTE — Progress Notes (Signed)
*  PRELIMINARY RESULTS* Echocardiogram 2D Echocardiogram has been performed.  Cheryl Swanson 03/03/2018, 1:48 PM

## 2018-06-10 ENCOUNTER — Other Ambulatory Visit (HOSPITAL_COMMUNITY): Payer: Self-pay | Admitting: Family Medicine

## 2018-06-10 ENCOUNTER — Ambulatory Visit (HOSPITAL_COMMUNITY)
Admission: RE | Admit: 2018-06-10 | Discharge: 2018-06-10 | Disposition: A | Discharge status: Home / Self Care | Payer: Medicare Other | Source: Ambulatory Visit | Attending: Family Medicine | Admitting: Family Medicine

## 2018-06-10 DIAGNOSIS — R5383 Other fatigue: Secondary | ICD-10-CM | POA: Insufficient documentation

## 2018-06-15 ENCOUNTER — Other Ambulatory Visit (HOSPITAL_COMMUNITY): Payer: Self-pay | Admitting: Family Medicine

## 2018-06-15 DIAGNOSIS — I618 Other nontraumatic intracerebral hemorrhage: Secondary | ICD-10-CM

## 2018-06-18 ENCOUNTER — Ambulatory Visit (HOSPITAL_COMMUNITY)
Admission: RE | Admit: 2018-06-18 | Discharge: 2018-06-18 | Disposition: A | Discharge status: Home / Self Care | Payer: Medicare Other | Source: Ambulatory Visit | Attending: Family Medicine | Admitting: Family Medicine

## 2018-06-18 DIAGNOSIS — I618 Other nontraumatic intracerebral hemorrhage: Secondary | ICD-10-CM | POA: Diagnosis present

## 2018-06-18 DIAGNOSIS — I083 Combined rheumatic disorders of mitral, aortic and tricuspid valves: Secondary | ICD-10-CM | POA: Diagnosis not present

## 2018-06-18 NOTE — Progress Notes (Signed)
*  PRELIMINARY RESULTS* Echocardiogram 2D Echocardiogram has been performed.  Stacey Drain 06/18/2018, 12:11 PM

## 2018-06-22 ENCOUNTER — Telehealth: Payer: Self-pay | Admitting: Cardiology

## 2018-06-22 NOTE — Telephone Encounter (Signed)
I am not certain what he means. We did switch to a new reporting system last week but everything should be in the report. What specifically was he looking for?

## 2018-06-22 NOTE — Telephone Encounter (Signed)
To Dr Koneswaran 

## 2018-06-22 NOTE — Telephone Encounter (Signed)
Per Maralyn Sago at Dr. Michelle Nasuti office-- Dr. Sudie Bailey would like for Dr. Purvis Sheffield to re read pt's echo, states that Dr. Sudie Bailey would like it "re-dictated" if possible, states there are some words missing from the report.

## 2018-06-23 NOTE — Telephone Encounter (Signed)
I spoke with Cheryl Swanson with dr.Knowlton.She will speak with Dr.Knowlton and call me back

## 2018-07-09 ENCOUNTER — Encounter: Payer: Self-pay | Admitting: Cardiology

## 2018-07-09 ENCOUNTER — Ambulatory Visit (INDEPENDENT_AMBULATORY_CARE_PROVIDER_SITE_OTHER): Payer: Medicare Other | Admitting: Cardiology

## 2018-07-09 VITALS — BP 124/62 | HR 88 | Ht 66.0 in | Wt 121.0 lb

## 2018-07-09 DIAGNOSIS — R5383 Other fatigue: Secondary | ICD-10-CM

## 2018-07-09 NOTE — Progress Notes (Signed)
Clinical Summary Ms. Erby is a 85 y.o.female seen as new consult, referred for fatigue and reported abnormal EKG at pcp office.   1. Abnormal EKG - ekg from pcp not included in records - EKG today shows NSR, biatrial enlargement, LVH Jan 2020 echo LVEF >65%, grade I diastolic dysfunction   2. Fatigue - no specific cardiopulmonary symptms, primarily generalized fatigue - extensive workup in 2014 by Dr Royann Shivers for similar symptoms with benign echo and GXT at the time - repeat echo Jan 2020 by pcp with normal LVEF, only grade I diastolic dysfunction   3. Rheumatoid arthritis   Past Medical History:  Diagnosis Date  . Glaucoma   . Mitral valve disorder   . Peptic ulcer   . Rheumatoid arthritis(714.0)   . Tricuspid valve disorder      No Known Allergies   Current Outpatient Medications  Medication Sig Dispense Refill  . albuterol (PROVENTIL HFA;VENTOLIN HFA) 108 (90 Base) MCG/ACT inhaler Inhale 2 puffs into the lungs every 4 (four) hours as needed for wheezing or shortness of breath. 1 Inhaler 3  . alendronate (FOSAMAX) 70 MG tablet Take 1 tablet by mouth once a week.    . benzonatate (TESSALON) 100 MG capsule Take 1 capsule (100 mg total) by mouth every 8 (eight) hours. 21 capsule 0  . calcium-vitamin D (OSCAL WITH D) 500-200 MG-UNIT per tablet Take 1 tablet by mouth 2 (two) times daily.    . Cholecalciferol (VITAMIN D3) 2000 UNITS TABS Take 1 capsule by mouth daily.    Marland Kitchen ibuprofen (ADVIL,MOTRIN) 200 MG tablet Take 400 mg by mouth every 6 (six) hours as needed.    . methotrexate (RHEUMATREX) 2.5 MG tablet Take 8 tablets by mouth once a week.    . simvastatin (ZOCOR) 20 MG tablet Take 1 tablet by mouth daily.    . TRAVATAN Z 0.004 % SOLN ophthalmic solution Place 1 drop into both eyes at bedtime.     No current facility-administered medications for this visit.      Past Surgical History:  Procedure Laterality Date  . ABDOMINAL HYSTERECTOMY  1971  . HAND SURGERY   1980's   left     No Known Allergies    Family History  Problem Relation Age of Onset  . Cancer Mother   . Cancer Father   . Cancer Brother   . Cancer Brother   . Cancer Son        prostate-remission     Social History Ms. Keune reports that she has never smoked. She does not have any smokeless tobacco history on file. Ms. Pagett reports no history of alcohol use.   Review of Systems CONSTITUTIONAL: +fatigue HEENT: Eyes: No visual loss, blurred vision, double vision or yellow sclerae.No hearing loss, sneezing, congestion, runny nose or sore throat.  SKIN: No rash or itching.  CARDIOVASCULAR: per hpi RESPIRATORY: No shortness of breath, cough or sputum.  GASTROINTESTINAL: No anorexia, nausea, vomiting or diarrhea. No abdominal pain or blood.  GENITOURINARY: No burning on urination, no polyuria NEUROLOGICAL: No headache, dizziness, syncope, paralysis, ataxia, numbness or tingling in the extremities. No change in bowel or bladder control.  MUSCULOSKELETAL: No muscle, back pain, joint pain or stiffness.  LYMPHATICS: No enlarged nodes. No history of splenectomy.  PSYCHIATRIC: No history of depression or anxiety.  ENDOCRINOLOGIC: No reports of sweating, cold or heat intolerance. No polyuria or polydipsia.  Marland Kitchen   Physical Examination Vitals:   07/09/18 0826 07/09/18 0832  BP: 130/66  124/62  Pulse: 88   SpO2: 98%    Vitals:   07/09/18 0826  Weight: 121 lb (54.9 kg)  Height: 5\' 6"  (1.676 m)    Gen: resting comfortably, no acute distress HEENT: no scleral icterus, pupils equal round and reactive, no palptable cervical adenopathy,  CV: RRR, no m/r/g, no jvd Resp: Clear to auscultation bilaterally GI: abdomen is soft, non-tender, non-distended, normal bowel sounds, no hepatosplenomegaly MSK: extremities are warm, no edema.  Skin: warm, no rash Neuro:  no focal deficits Psych: appropriate affect     Assessment and Plan  1. Fatigue - no specific cardiopulmonary  symptmos - extensive workup in 2014 for similar symptoms were benign. Recent echo overall benign. No symptoms to suggest ischemia, would not recommend stress testing - overall nonspecific generalized fatigue without evidence of cardiac component, perhaps related to her rheumatoid arthritits, deconditioning  No further cardiac testing planned, may f/u as needed      Antoine Poche, M.D.

## 2018-07-09 NOTE — Patient Instructions (Signed)
Medication Instructions:  Your physician recommends that you continue on your current medications as directed. Please refer to the Current Medication list given to you today.   Labwork: NONE  Testing/Procedures: NONE  Follow-Up: Your physician recommends that you schedule a follow-up appointment in: AS NEEDED      Any Other Special Instructions Will Be Listed Below (If Applicable).     If you need a refill on your cardiac medications before your next appointment, please call your pharmacy.   

## 2019-02-02 ENCOUNTER — Other Ambulatory Visit (HOSPITAL_COMMUNITY): Payer: Self-pay | Admitting: Family Medicine

## 2019-02-02 DIAGNOSIS — R609 Edema, unspecified: Secondary | ICD-10-CM

## 2019-02-02 DIAGNOSIS — M79604 Pain in right leg: Secondary | ICD-10-CM

## 2019-02-03 ENCOUNTER — Ambulatory Visit (HOSPITAL_COMMUNITY)
Admission: RE | Admit: 2019-02-03 | Discharge: 2019-02-03 | Disposition: A | Discharge status: Home / Self Care | Payer: Medicare Other | Source: Ambulatory Visit | Attending: Family Medicine | Admitting: Family Medicine

## 2019-02-03 ENCOUNTER — Other Ambulatory Visit: Payer: Self-pay

## 2019-02-03 DIAGNOSIS — R609 Edema, unspecified: Secondary | ICD-10-CM | POA: Diagnosis present

## 2019-02-03 DIAGNOSIS — M79604 Pain in right leg: Secondary | ICD-10-CM

## 2019-02-05 ENCOUNTER — Other Ambulatory Visit (HOSPITAL_COMMUNITY)
Admission: RE | Admit: 2019-02-05 | Discharge: 2019-02-05 | Disposition: A | Discharge status: Home / Self Care | Payer: Medicare Other | Source: Ambulatory Visit | Attending: Family Medicine | Admitting: Family Medicine

## 2019-02-05 DIAGNOSIS — Z7901 Long term (current) use of anticoagulants: Secondary | ICD-10-CM | POA: Insufficient documentation

## 2019-02-05 LAB — PROTIME-INR
INR: 1.1 (ref 0.8–1.2)
Prothrombin Time: 13.9 seconds (ref 11.4–15.2)

## 2019-02-08 ENCOUNTER — Other Ambulatory Visit (HOSPITAL_COMMUNITY)
Admission: RE | Admit: 2019-02-08 | Discharge: 2019-02-08 | Disposition: A | Discharge status: Home / Self Care | Payer: Medicare Other | Source: Ambulatory Visit | Attending: Nurse Practitioner | Admitting: Nurse Practitioner

## 2019-02-08 DIAGNOSIS — Z7901 Long term (current) use of anticoagulants: Secondary | ICD-10-CM | POA: Diagnosis not present

## 2019-02-08 DIAGNOSIS — I82409 Acute embolism and thrombosis of unspecified deep veins of unspecified lower extremity: Secondary | ICD-10-CM | POA: Insufficient documentation

## 2019-02-08 LAB — PROTIME-INR
INR: 2.2 — ABNORMAL HIGH (ref 0.8–1.2)
Prothrombin Time: 23.8 seconds — ABNORMAL HIGH (ref 11.4–15.2)

## 2019-02-12 ENCOUNTER — Other Ambulatory Visit (HOSPITAL_COMMUNITY)
Admission: RE | Admit: 2019-02-12 | Discharge: 2019-02-12 | Disposition: A | Discharge status: Home / Self Care | Payer: Medicare Other | Source: Ambulatory Visit | Attending: Nurse Practitioner | Admitting: Nurse Practitioner

## 2019-02-12 DIAGNOSIS — Z7901 Long term (current) use of anticoagulants: Secondary | ICD-10-CM | POA: Diagnosis present

## 2019-02-12 DIAGNOSIS — I82401 Acute embolism and thrombosis of unspecified deep veins of right lower extremity: Secondary | ICD-10-CM | POA: Diagnosis present

## 2019-02-12 LAB — PROTIME-INR
INR: 3.2 — ABNORMAL HIGH (ref 0.8–1.2)
Prothrombin Time: 32.6 seconds — ABNORMAL HIGH (ref 11.4–15.2)

## 2019-02-15 ENCOUNTER — Other Ambulatory Visit (HOSPITAL_COMMUNITY)
Admission: RE | Admit: 2019-02-15 | Discharge: 2019-02-15 | Disposition: A | Discharge status: Home / Self Care | Payer: Medicare Other | Source: Ambulatory Visit | Attending: Family Medicine | Admitting: Family Medicine

## 2019-02-15 DIAGNOSIS — I82401 Acute embolism and thrombosis of unspecified deep veins of right lower extremity: Secondary | ICD-10-CM | POA: Diagnosis present

## 2019-02-15 LAB — PROTIME-INR
INR: 4.9 (ref 0.8–1.2)
Prothrombin Time: 44.5 seconds — ABNORMAL HIGH (ref 11.4–15.2)

## 2019-02-17 ENCOUNTER — Other Ambulatory Visit (HOSPITAL_COMMUNITY)
Admission: RE | Admit: 2019-02-17 | Discharge: 2019-02-17 | Disposition: A | Discharge status: Home / Self Care | Payer: Medicare Other | Source: Ambulatory Visit | Attending: Family Medicine | Admitting: Family Medicine

## 2019-02-17 DIAGNOSIS — Z7901 Long term (current) use of anticoagulants: Secondary | ICD-10-CM | POA: Diagnosis present

## 2019-02-17 DIAGNOSIS — I82401 Acute embolism and thrombosis of unspecified deep veins of right lower extremity: Secondary | ICD-10-CM | POA: Diagnosis present

## 2019-02-17 LAB — PROTIME-INR
INR: 4.3 (ref 0.8–1.2)
Prothrombin Time: 40.2 seconds — ABNORMAL HIGH (ref 11.4–15.2)

## 2019-02-19 ENCOUNTER — Other Ambulatory Visit (HOSPITAL_COMMUNITY)
Admission: RE | Admit: 2019-02-19 | Discharge: 2019-02-19 | Disposition: A | Discharge status: Home / Self Care | Payer: Medicare Other | Source: Ambulatory Visit | Attending: Family Medicine | Admitting: Family Medicine

## 2019-02-19 DIAGNOSIS — Z7901 Long term (current) use of anticoagulants: Secondary | ICD-10-CM | POA: Insufficient documentation

## 2019-02-19 DIAGNOSIS — I82401 Acute embolism and thrombosis of unspecified deep veins of right lower extremity: Secondary | ICD-10-CM | POA: Insufficient documentation

## 2019-02-19 LAB — PROTIME-INR
INR: 2.2 — ABNORMAL HIGH (ref 0.8–1.2)
Prothrombin Time: 23.9 seconds — ABNORMAL HIGH (ref 11.4–15.2)

## 2019-02-23 ENCOUNTER — Other Ambulatory Visit (HOSPITAL_COMMUNITY)
Admission: RE | Admit: 2019-02-23 | Discharge: 2019-02-23 | Disposition: A | Discharge status: Home / Self Care | Payer: Medicare Other | Source: Ambulatory Visit | Attending: Family Medicine | Admitting: Family Medicine

## 2019-02-23 DIAGNOSIS — Z7901 Long term (current) use of anticoagulants: Secondary | ICD-10-CM | POA: Insufficient documentation

## 2019-02-23 LAB — PROTIME-INR
INR: 1.7 — ABNORMAL HIGH (ref 0.8–1.2)
Prothrombin Time: 19.6 seconds — ABNORMAL HIGH (ref 11.4–15.2)

## 2019-03-02 ENCOUNTER — Other Ambulatory Visit: Payer: Self-pay

## 2019-03-02 ENCOUNTER — Other Ambulatory Visit (HOSPITAL_COMMUNITY)
Admission: RE | Admit: 2019-03-02 | Discharge: 2019-03-02 | Disposition: A | Discharge status: Home / Self Care | Payer: Medicare Other | Source: Ambulatory Visit | Attending: Family Medicine | Admitting: Family Medicine

## 2019-03-02 DIAGNOSIS — Z7901 Long term (current) use of anticoagulants: Secondary | ICD-10-CM | POA: Insufficient documentation

## 2019-03-02 LAB — PROTIME-INR
INR: 2.5 — ABNORMAL HIGH (ref 0.8–1.2)
Prothrombin Time: 26.3 seconds — ABNORMAL HIGH (ref 11.4–15.2)

## 2019-03-16 ENCOUNTER — Other Ambulatory Visit (HOSPITAL_COMMUNITY)
Admission: RE | Admit: 2019-03-16 | Discharge: 2019-03-16 | Disposition: A | Discharge status: Home / Self Care | Payer: Medicare Other | Source: Ambulatory Visit | Attending: Family Medicine | Admitting: Family Medicine

## 2019-03-16 DIAGNOSIS — I82401 Acute embolism and thrombosis of unspecified deep veins of right lower extremity: Secondary | ICD-10-CM | POA: Insufficient documentation

## 2019-03-16 DIAGNOSIS — Z7901 Long term (current) use of anticoagulants: Secondary | ICD-10-CM | POA: Diagnosis present

## 2019-03-16 LAB — PROTIME-INR
INR: 2.5 — ABNORMAL HIGH (ref 0.8–1.2)
Prothrombin Time: 26.9 seconds — ABNORMAL HIGH (ref 11.4–15.2)

## 2019-04-13 ENCOUNTER — Other Ambulatory Visit (HOSPITAL_COMMUNITY)
Admission: RE | Admit: 2019-04-13 | Discharge: 2019-04-13 | Disposition: A | Discharge status: Home / Self Care | Payer: Medicare Other | Source: Ambulatory Visit | Attending: Family Medicine | Admitting: Family Medicine

## 2019-04-13 ENCOUNTER — Other Ambulatory Visit: Payer: Self-pay

## 2019-04-13 DIAGNOSIS — Z7901 Long term (current) use of anticoagulants: Secondary | ICD-10-CM | POA: Diagnosis present

## 2019-04-13 LAB — PROTIME-INR
INR: 2.2 — ABNORMAL HIGH (ref 0.8–1.2)
Prothrombin Time: 24.6 seconds — ABNORMAL HIGH (ref 11.4–15.2)

## 2019-05-18 ENCOUNTER — Other Ambulatory Visit (HOSPITAL_COMMUNITY)
Admission: RE | Admit: 2019-05-18 | Discharge: 2019-05-18 | Disposition: A | Discharge status: Home / Self Care | Payer: Medicare Other | Source: Ambulatory Visit | Attending: Family Medicine | Admitting: Family Medicine

## 2019-05-18 DIAGNOSIS — M069 Rheumatoid arthritis, unspecified: Secondary | ICD-10-CM | POA: Insufficient documentation

## 2019-05-18 DIAGNOSIS — E559 Vitamin D deficiency, unspecified: Secondary | ICD-10-CM | POA: Insufficient documentation

## 2019-05-18 DIAGNOSIS — Z7901 Long term (current) use of anticoagulants: Secondary | ICD-10-CM | POA: Insufficient documentation

## 2019-05-18 DIAGNOSIS — Z79899 Other long term (current) drug therapy: Secondary | ICD-10-CM | POA: Insufficient documentation

## 2019-05-18 DIAGNOSIS — E7800 Pure hypercholesterolemia, unspecified: Secondary | ICD-10-CM | POA: Diagnosis not present

## 2019-05-18 LAB — COMPREHENSIVE METABOLIC PANEL
ALT: 11 U/L (ref 0–44)
AST: 19 U/L (ref 15–41)
Albumin: 3.5 g/dL (ref 3.5–5.0)
Alkaline Phosphatase: 53 U/L (ref 38–126)
Anion gap: 8 (ref 5–15)
BUN: 13 mg/dL (ref 8–23)
CO2: 28 mmol/L (ref 22–32)
Calcium: 9.8 mg/dL (ref 8.9–10.3)
Chloride: 104 mmol/L (ref 98–111)
Creatinine, Ser: 0.64 mg/dL (ref 0.44–1.00)
GFR calc Af Amer: >60 mL/min (ref >60)
GFR calc non Af Amer: >60 mL/min (ref >60)
Glucose, Bld: 95 mg/dL (ref 70–99)
Potassium: 4 mmol/L (ref 3.5–5.1)
Sodium: 140 mmol/L (ref 135–145)
Total Bilirubin: 1 mg/dL (ref 0.3–1.2)
Total Protein: 7.3 g/dL (ref 6.5–8.1)

## 2019-05-18 LAB — PROTIME-INR
INR: 1.9 — ABNORMAL HIGH (ref 0.8–1.2)
Prothrombin Time: 21.9 seconds — ABNORMAL HIGH (ref 11.4–15.2)

## 2019-05-18 LAB — CBC
HCT: 45.4 % (ref 36.0–46.0)
Hemoglobin: 14.5 g/dL (ref 12.0–15.0)
MCH: 30.6 pg (ref 26.0–34.0)
MCHC: 31.9 g/dL (ref 30.0–36.0)
MCV: 95.8 fL (ref 80.0–100.0)
Platelets: 335 10*3/uL (ref 150–400)
RBC: 4.74 MIL/uL (ref 3.87–5.11)
RDW: 13.7 % (ref 11.5–15.5)
WBC: 4.6 10*3/uL (ref 4.0–10.5)
nRBC: 0 % (ref 0.0–0.2)

## 2019-06-10 ENCOUNTER — Other Ambulatory Visit: Payer: Self-pay

## 2019-06-10 ENCOUNTER — Ambulatory Visit: Payer: Medicare Other | Attending: Internal Medicine

## 2019-06-10 DIAGNOSIS — Z20822 Contact with and (suspected) exposure to covid-19: Secondary | ICD-10-CM

## 2019-06-11 LAB — NOVEL CORONAVIRUS, NAA: SARS-CoV-2, NAA: NOT DETECTED

## 2019-06-15 ENCOUNTER — Telehealth: Payer: Self-pay | Admitting: *Deleted

## 2019-06-15 NOTE — Telephone Encounter (Signed)
Pt given result of COVID test; she verbalized understanding. 

## 2019-06-26 ENCOUNTER — Ambulatory Visit: Payer: Medicare Other | Attending: Internal Medicine

## 2019-06-26 ENCOUNTER — Other Ambulatory Visit: Payer: Self-pay

## 2019-06-26 DIAGNOSIS — Z23 Encounter for immunization: Secondary | ICD-10-CM | POA: Insufficient documentation

## 2019-06-26 NOTE — Progress Notes (Signed)
   Covid-19 Vaccination Clinic  Name:  Cheryl Swanson    MRN: [id number] DOB: [birth year]  06/26/2019  [name] was observed post Covid-19 immunization for 15 minutes without incidence. She was provided with Vaccine Information Sheet and instruction to access the V-Safe system.   [name] was instructed to call 911 with any severe reactions post vaccine: Marland Kitchen Difficulty breathing  . Swelling of your face and throat  . A fast heartbeat  . A bad rash all over your body  . Dizziness and weakness    Immunizations Administered    Name Date Dose VIS Date Route   Moderna COVID-19 Vaccine 06/26/2019 11:32 AM 0.5 mL 04/20/2019 Intramuscular   Manufacturer: Moderna   Lot: [id number]   NDC: 97847-841-28

## 2019-07-19 ENCOUNTER — Other Ambulatory Visit (HOSPITAL_COMMUNITY)
Admission: RE | Admit: 2019-07-19 | Discharge: 2019-07-19 | Disposition: A | Discharge status: Home / Self Care | Payer: Medicare Other | Source: Ambulatory Visit | Attending: Nurse Practitioner | Admitting: Nurse Practitioner

## 2019-07-19 DIAGNOSIS — M069 Rheumatoid arthritis, unspecified: Secondary | ICD-10-CM | POA: Insufficient documentation

## 2019-07-19 DIAGNOSIS — R630 Anorexia: Secondary | ICD-10-CM | POA: Diagnosis present

## 2019-07-19 DIAGNOSIS — Z79899 Other long term (current) drug therapy: Secondary | ICD-10-CM | POA: Diagnosis present

## 2019-07-19 DIAGNOSIS — E7800 Pure hypercholesterolemia, unspecified: Secondary | ICD-10-CM | POA: Diagnosis present

## 2019-07-19 LAB — COMPREHENSIVE METABOLIC PANEL
ALT: 8 U/L (ref 0–44)
AST: 14 U/L — ABNORMAL LOW (ref 15–41)
Albumin: 3.6 g/dL (ref 3.5–5.0)
Alkaline Phosphatase: 42 U/L (ref 38–126)
Anion gap: 7 (ref 5–15)
BUN: 13 mg/dL (ref 8–23)
CO2: 27 mmol/L (ref 22–32)
Calcium: 9.7 mg/dL (ref 8.9–10.3)
Chloride: 106 mmol/L (ref 98–111)
Creatinine, Ser: 0.78 mg/dL (ref 0.44–1.00)
GFR calc Af Amer: >60 mL/min (ref >60)
GFR calc non Af Amer: >60 mL/min (ref >60)
Glucose, Bld: 90 mg/dL (ref 70–99)
Potassium: 3.7 mmol/L (ref 3.5–5.1)
Sodium: 140 mmol/L (ref 135–145)
Total Bilirubin: 1.1 mg/dL (ref 0.3–1.2)
Total Protein: 7.4 g/dL (ref 6.5–8.1)

## 2019-07-19 LAB — CBC
HCT: 44.7 % (ref 36.0–46.0)
Hemoglobin: 14.5 g/dL (ref 12.0–15.0)
MCH: 31.7 pg (ref 26.0–34.0)
MCHC: 32.4 g/dL (ref 30.0–36.0)
MCV: 97.6 fL (ref 80.0–100.0)
Platelets: 266 10*3/uL (ref 150–400)
RBC: 4.58 MIL/uL (ref 3.87–5.11)
RDW: 15.1 % (ref 11.5–15.5)
WBC: 6.1 10*3/uL (ref 4.0–10.5)
nRBC: 0 % (ref 0.0–0.2)

## 2019-07-19 LAB — LIPID PANEL
Cholesterol: 159 mg/dL (ref 0–200)
HDL: 58 mg/dL (ref >40)
LDL Cholesterol: 93 mg/dL (ref 0–99)
Total CHOL/HDL Ratio: 2.7 RATIO
Triglycerides: 41 mg/dL (ref <150)
VLDL: 8 mg/dL (ref 0–40)

## 2019-07-19 LAB — TSH: TSH: 3.167 u[IU]/mL (ref 0.350–4.500)

## 2019-07-27 ENCOUNTER — Ambulatory Visit: Payer: Medicare Other | Attending: Internal Medicine

## 2019-07-27 DIAGNOSIS — Z23 Encounter for immunization: Secondary | ICD-10-CM | POA: Insufficient documentation

## 2019-07-27 NOTE — Progress Notes (Signed)
   Covid-19 Vaccination Clinic  Name:  Cheryl Swanson    MRN: [id number] DOB: [birth year]  07/27/2019  [name] was observed post Covid-19 immunization for 15 minutes without incident. She was provided with Vaccine Information Sheet and instruction to access the V-Safe system.   [name] was instructed to call 911 with any severe reactions post vaccine: Marland Kitchen Difficulty breathing  . Swelling of face and throat  . A fast heartbeat  . A bad rash all over body  . Dizziness and weakness   Immunizations Administered    Name Date Dose VIS Date Route   Moderna COVID-19 Vaccine 07/27/2019 11:21 AM 0.5 mL 04/20/2019 Intramuscular   Manufacturer: Moderna   Lot: [id number]   NDC: 15400-867-61

## 2019-08-16 ENCOUNTER — Inpatient Hospital Stay (HOSPITAL_COMMUNITY)
Admission: EM | Admit: 2019-08-16 | Discharge: 2019-08-18 | DRG: 176 | Disposition: A | Discharge status: Home Health | Payer: Medicare Other | Attending: Family Medicine | Admitting: Family Medicine

## 2019-08-16 ENCOUNTER — Ambulatory Visit (HOSPITAL_COMMUNITY)
Admission: RE | Admit: 2019-08-16 | Discharge: 2019-08-16 | Disposition: A | Discharge status: Home / Self Care | Payer: Medicare Other | Source: Ambulatory Visit | Attending: Nurse Practitioner | Admitting: Nurse Practitioner

## 2019-08-16 ENCOUNTER — Other Ambulatory Visit (HOSPITAL_COMMUNITY): Payer: Self-pay | Admitting: Nurse Practitioner

## 2019-08-16 ENCOUNTER — Other Ambulatory Visit: Payer: Self-pay

## 2019-08-16 ENCOUNTER — Other Ambulatory Visit: Payer: Self-pay | Admitting: Nurse Practitioner

## 2019-08-16 ENCOUNTER — Encounter (HOSPITAL_COMMUNITY): Payer: Self-pay

## 2019-08-16 DIAGNOSIS — E876 Hypokalemia: Secondary | ICD-10-CM | POA: Diagnosis present

## 2019-08-16 DIAGNOSIS — I2699 Other pulmonary embolism without acute cor pulmonale: Secondary | ICD-10-CM | POA: Diagnosis not present

## 2019-08-16 DIAGNOSIS — R5382 Chronic fatigue, unspecified: Secondary | ICD-10-CM | POA: Diagnosis present

## 2019-08-16 DIAGNOSIS — Z90710 Acquired absence of both cervix and uterus: Secondary | ICD-10-CM

## 2019-08-16 DIAGNOSIS — Z8711 Personal history of peptic ulcer disease: Secondary | ICD-10-CM | POA: Diagnosis not present

## 2019-08-16 DIAGNOSIS — R269 Unspecified abnormalities of gait and mobility: Secondary | ICD-10-CM | POA: Diagnosis present

## 2019-08-16 DIAGNOSIS — H409 Unspecified glaucoma: Secondary | ICD-10-CM | POA: Diagnosis present

## 2019-08-16 DIAGNOSIS — I2694 Multiple subsegmental pulmonary emboli without acute cor pulmonale: Principal | ICD-10-CM | POA: Diagnosis present

## 2019-08-16 DIAGNOSIS — I700 Atherosclerosis of aorta: Secondary | ICD-10-CM | POA: Diagnosis present

## 2019-08-16 DIAGNOSIS — M069 Rheumatoid arthritis, unspecified: Secondary | ICD-10-CM | POA: Diagnosis present

## 2019-08-16 DIAGNOSIS — Z20822 Contact with and (suspected) exposure to covid-19: Secondary | ICD-10-CM | POA: Diagnosis present

## 2019-08-16 DIAGNOSIS — I340 Nonrheumatic mitral (valve) insufficiency: Secondary | ICD-10-CM | POA: Diagnosis not present

## 2019-08-16 DIAGNOSIS — R5383 Other fatigue: Secondary | ICD-10-CM | POA: Diagnosis present

## 2019-08-16 DIAGNOSIS — R0609 Other forms of dyspnea: Secondary | ICD-10-CM | POA: Diagnosis present

## 2019-08-16 DIAGNOSIS — Z79891 Long term (current) use of opiate analgesic: Secondary | ICD-10-CM | POA: Diagnosis not present

## 2019-08-16 DIAGNOSIS — R0602 Shortness of breath: Secondary | ICD-10-CM | POA: Insufficient documentation

## 2019-08-16 DIAGNOSIS — Z7952 Long term (current) use of systemic steroids: Secondary | ICD-10-CM | POA: Diagnosis not present

## 2019-08-16 DIAGNOSIS — Z79899 Other long term (current) drug therapy: Secondary | ICD-10-CM | POA: Diagnosis not present

## 2019-08-16 DIAGNOSIS — Z86718 Personal history of other venous thrombosis and embolism: Secondary | ICD-10-CM

## 2019-08-16 LAB — CBC WITH DIFFERENTIAL/PLATELET
Abs Immature Granulocytes: 0.06 10*3/uL (ref 0.00–0.07)
Basophils Absolute: 0.1 10*3/uL (ref 0.0–0.1)
Basophils Relative: 1 %
Eosinophils Absolute: 0.1 10*3/uL (ref 0.0–0.5)
Eosinophils Relative: 2 %
HCT: 48.8 % — ABNORMAL HIGH (ref 36.0–46.0)
Hemoglobin: 16.2 g/dL — ABNORMAL HIGH (ref 12.0–15.0)
Immature Granulocytes: 1 %
Lymphocytes Relative: 23 %
Lymphs Abs: 1.9 10*3/uL (ref 0.7–4.0)
MCH: 31.5 pg (ref 26.0–34.0)
MCHC: 33.2 g/dL (ref 30.0–36.0)
MCV: 94.9 fL (ref 80.0–100.0)
Monocytes Absolute: 1.1 10*3/uL — ABNORMAL HIGH (ref 0.1–1.0)
Monocytes Relative: 14 %
Neutro Abs: 4.9 10*3/uL (ref 1.7–7.7)
Neutrophils Relative %: 59 %
Platelets: 337 10*3/uL (ref 150–400)
RBC: 5.14 MIL/uL — ABNORMAL HIGH (ref 3.87–5.11)
RDW: 14.3 % (ref 11.5–15.5)
WBC: 8.2 10*3/uL (ref 4.0–10.5)
nRBC: 0 % (ref 0.0–0.2)

## 2019-08-16 LAB — TROPONIN I (HIGH SENSITIVITY): Troponin I (High Sensitivity): 7 ng/L (ref <18)

## 2019-08-16 LAB — BASIC METABOLIC PANEL
Anion gap: 9 (ref 5–15)
BUN: 12 mg/dL (ref 8–23)
CO2: 24 mmol/L (ref 22–32)
Calcium: 9.9 mg/dL (ref 8.9–10.3)
Chloride: 103 mmol/L (ref 98–111)
Creatinine, Ser: 0.83 mg/dL (ref 0.44–1.00)
GFR calc Af Amer: >60 mL/min (ref >60)
GFR calc non Af Amer: >60 mL/min (ref >60)
Glucose, Bld: 103 mg/dL — ABNORMAL HIGH (ref 70–99)
Potassium: 3.4 mmol/L — ABNORMAL LOW (ref 3.5–5.1)
Sodium: 136 mmol/L (ref 135–145)

## 2019-08-16 LAB — SARS CORONAVIRUS 2 (TAT 6-24 HRS): SARS Coronavirus 2: NEGATIVE

## 2019-08-16 LAB — PROTIME-INR
INR: 1.1 (ref 0.8–1.2)
Prothrombin Time: 14 seconds (ref 11.4–15.2)

## 2019-08-16 LAB — APTT: aPTT: 29 seconds (ref 24–36)

## 2019-08-16 MED ORDER — SIMVASTATIN 20 MG PO TABS
20.0000 mg | ORAL_TABLET | Freq: Every day | ORAL | Status: DC
  Administered 2019-08-16 – 2019-08-18 (×3): 20 mg via ORAL
  Filled 2019-08-16 (×3): qty 1

## 2019-08-16 MED ORDER — TRAMADOL HCL 50 MG PO TABS: 50.0000 mg | ORAL_TABLET | Freq: Two times a day (BID) | ORAL | Status: DC | PRN

## 2019-08-16 MED ORDER — ALBUTEROL SULFATE (2.5 MG/3ML) 0.083% IN NEBU: 2.5000 mg | INHALATION_SOLUTION | Freq: Four times a day (QID) | RESPIRATORY_TRACT | Status: DC | PRN

## 2019-08-16 MED ORDER — SODIUM CHLORIDE 0.9 % IV SOLN: INTRAVENOUS | Status: AC

## 2019-08-16 MED ORDER — ONDANSETRON HCL 4 MG PO TABS: 4.0000 mg | ORAL_TABLET | Freq: Four times a day (QID) | ORAL | Status: DC | PRN

## 2019-08-16 MED ORDER — ONDANSETRON HCL 4 MG/2ML IJ SOLN: 4.0000 mg | Freq: Four times a day (QID) | INTRAMUSCULAR | Status: DC | PRN

## 2019-08-16 MED ORDER — HEPARIN (PORCINE) 25000 UT/250ML-% IV SOLN
650.0000 [IU]/h | INTRAVENOUS | Status: DC
  Administered 2019-08-16: 900 [IU]/h via INTRAVENOUS
  Administered 2019-08-17: 800 [IU]/h via INTRAVENOUS
  Filled 2019-08-16 (×2): qty 250

## 2019-08-16 MED ORDER — IOHEXOL 350 MG/ML SOLN
100.0000 mL | Freq: Once | INTRAVENOUS | Status: AC | PRN
  Administered 2019-08-16: 14:00:00 80 mL via INTRAVENOUS

## 2019-08-16 MED ORDER — HEPARIN BOLUS VIA INFUSION
2700.0000 [IU] | Freq: Once | INTRAVENOUS | Status: AC
  Administered 2019-08-16: 17:00:00 2700 [IU] via INTRAVENOUS

## 2019-08-16 MED ORDER — POTASSIUM CHLORIDE 20 MEQ PO PACK
20.0000 meq | PACK | Freq: Once | ORAL | Status: AC
  Administered 2019-08-16: 20 meq via ORAL
  Filled 2019-08-16: qty 1

## 2019-08-16 MED ORDER — LORAZEPAM 0.5 MG PO TABS: 0.5000 mg | ORAL_TABLET | Freq: Four times a day (QID) | ORAL | Status: DC | PRN

## 2019-08-16 MED ORDER — CALCIUM CARBONATE-VITAMIN D 500-200 MG-UNIT PO TABS
1.0000 | ORAL_TABLET | Freq: Two times a day (BID) | ORAL | Status: DC
  Administered 2019-08-16 – 2019-08-18 (×4): 1 via ORAL
  Filled 2019-08-16 (×4): qty 1

## 2019-08-16 NOTE — ED Provider Notes (Signed)
[facility] EMERGENCY DEPARTMENT Provider Note   CSN: [id number] Arrival date & time: 08/16/19  8341     History Chief Complaint  Patient presents with  . Shortness of Breath    Cheryl Swanson is a 86 y.o. female.  HPI   This patient is an 86 year old female, she has a known history of rheumatoid arthritis, she has been short of breath recently, was seen by her family doctor who ordered a CT angiogram and was been found to have multiple pulmonary emboli today.  The patient has ongoing shortness of breath, she is not hypoxic however she is mildly tachycardic at 110 bpm.  Symptoms are persistent, moderate, worse with ambulation, not associated with coughing or fever or hemoptysis.  On further questioning the patient reports having a recent blood clot in her right leg diagnosis back in September 2020, she took several months of anticoagulants and this was recently discontinued, after having increasing fatigue and shortness of breath over the last month they redid the CT scan showing blood clots today.  She has no prior history other than that of any blood clots in the past  I have personally reviewed the EMR as well as viewing the images from the CT angiogram done today and agree with interpretation of CT scan showing multiple PE's.  Past Medical History:  Diagnosis Date  . Glaucoma   . Mitral valve disorder   . Peptic ulcer   . Rheumatoid arthritis(714.0)   . Tricuspid valve disorder     Patient Active Problem List   Diagnosis Date Noted  . Fatigue 10/29/2012  . Rheumatoid arthritis (HCC) 10/29/2012    Past Surgical History:  Procedure Laterality Date  . ABDOMINAL HYSTERECTOMY  1971  . HAND SURGERY  1980's   left     OB History   No obstetric history on file.     Family History  Problem Relation Age of Onset  . Cancer Mother   . Cancer Father   . Cancer Brother   . Cancer Brother   . Cancer Son        prostate-remission    Social History   Tobacco Use  .  Smoking status: Never Smoker  . Smokeless tobacco: Never Used  Substance Use Topics  . Alcohol use: No  . Drug use: No    Home Medications Prior to Admission medications   Medication Sig Start Date End Date Taking? Authorizing Provider  albuterol (PROVENTIL HFA;VENTOLIN HFA) 108 (90 Base) MCG/ACT inhaler Inhale 2 puffs into the lungs every 4 (four) hours as needed for wheezing or shortness of breath. 07/13/15   Eber Hong, MD  calcium-vitamin D (OSCAL WITH D) 500-200 MG-UNIT per tablet Take 1 tablet by mouth 2 (two) times daily.    [provider]  Cholecalciferol (VITAMIN D3) 2000 UNITS TABS Take 1 capsule by mouth daily.    [provider]  latanoprost (XALATAN) 0.005 % ophthalmic solution 1 drop at bedtime.    [provider]  LORazepam (ATIVAN) 0.5 MG tablet  02/13/18   [provider]  megestrol (MEGACE) 40 MG/ML suspension Take 400 mg by mouth daily. 07/20/19   [provider]  methotrexate (RHEUMATREX) 2.5 MG tablet Take 8 tablets by mouth once a week. 10/05/12   [provider]  predniSONE (DELTASONE) 10 MG tablet  06/09/18   [provider]  simvastatin (ZOCOR) 20 MG tablet Take 1 tablet by mouth daily. 10/05/12   [provider]  traMADol (ULTRAM) 50 MG tablet  Take 50 mg by mouth 3 (three) times daily as needed. 08/16/19   [provider]    Allergies    Patient has no known allergies.  Review of Systems   Review of Systems  All other systems reviewed and are negative.   Physical Exam Updated Vital Signs BP (!) 161/80 (BP Location: Right Arm)   Pulse (!) 110   Temp 98.6 F (37 C) (Oral)   Resp (!) 22   Ht 1.676 m (5\' 6" )   Wt 54.4 kg   SpO2 100%   BMI 19.37 kg/m   Physical Exam Vitals and nursing note reviewed.  Constitutional:      General: She is not in acute distress.    Appearance: She is well-developed.  HENT:     Head: Normocephalic and atraumatic.     Mouth/Throat:      Pharynx: No oropharyngeal exudate.  Eyes:     General: No scleral icterus.       Right eye: No discharge.        Left eye: No discharge.     Conjunctiva/sclera: Conjunctivae normal.     Pupils: Pupils are equal, round, and reactive to light.  Neck:     Thyroid: No thyromegaly.     Vascular: No JVD.  Cardiovascular:     Rate and Rhythm: Regular rhythm. Tachycardia present.     Heart sounds: Normal heart sounds. No murmur. No friction rub. No gallop.   Pulmonary:     Effort: Pulmonary effort is normal. Tachypnea present. No respiratory distress.     Breath sounds: Normal breath sounds. No wheezing or rales.  Abdominal:     General: Bowel sounds are normal. There is no distension.     Palpations: Abdomen is soft. There is no mass.     Tenderness: There is no abdominal tenderness.  Musculoskeletal:        General: No tenderness. Normal range of motion.     Cervical back: Normal range of motion and neck supple.  Lymphadenopathy:     Cervical: No cervical adenopathy.  Skin:    General: Skin is warm and dry.     Findings: No erythema or rash.  Neurological:     Mental Status: She is alert.     Coordination: Coordination normal.  Psychiatric:        Behavior: Behavior normal.     ED Results / Procedures / Treatments   Labs (all labs ordered are listed, but only abnormal results are displayed) Labs Reviewed  CBC WITH DIFFERENTIAL/PLATELET - Abnormal; Notable for the following components:      Result Value   RBC 5.14 (*)    Hemoglobin 16.2 (*)    HCT 48.8 (*)    Monocytes Absolute 1.1 (*)    All other components within normal limits  BASIC METABOLIC PANEL - Abnormal; Notable for the following components:   Potassium 3.4 (*)    Glucose, Bld 103 (*)    All other components within normal limits  SARS CORONAVIRUS 2 (TAT 6-24 HRS)  APTT  PROTIME-INR  HEPARIN LEVEL (UNFRACTIONATED)  CBC  HEPARIN LEVEL (UNFRACTIONATED)  TROPONIN I (HIGH SENSITIVITY)    EKG EKG  Interpretation  Date/Time:  Monday August 16 2019 16:19:07 EDT Ventricular Rate:  105 PR Interval:  156 QRS Duration: 66 QT Interval:  318 QTC Calculation: 420 R Axis:   46 Text Interpretation: Sinus tachycardia Biatrial enlargement Cannot rule out Anterior infarct , age undetermined Abnormal ECG since last tracing no significant  change Confirmed by Noemi Chapel [phone]) on 08/16/2019 4:27:37 PM   Radiology CT ANGIO CHEST PE W OR WO CONTRAST  Result Date: 08/16/2019 CLINICAL DATA:  Shortness of breath for the past 2 weeks. EXAM: CT ANGIOGRAPHY CHEST WITH CONTRAST TECHNIQUE: Multidetector CT imaging of the chest was performed using the standard protocol during bolus administration of intravenous contrast. Multiplanar CT image reconstructions and MIPs were obtained to evaluate the vascular anatomy. CONTRAST:  28mL OMNIPAQUE IOHEXOL 350 MG/ML SOLN COMPARISON:  Chest x-ray dated June 10, 2018. CT chest dated April 14, 2012. FINDINGS: Cardiovascular: Small acute nonocclusive subsegmental pulmonary emboli in the left upper lobe, right middle lobe, and both lower lobes. Normal heart size. No pericardial effusion. No thoracic aortic aneurysm dissection. Minimal atherosclerotic calcification of the aortic arch. Mediastinum/Nodes: No enlarged mediastinal, hilar, or axillary lymph nodes. Thyroid gland, trachea, and esophagus demonstrate no significant findings. Lungs/Pleura: No focal consolidation, pleural effusion, or pneumothorax. Unchanged 4 mm ground-glass nodule in the right upper lobe (series 6, image 60), stable since 2013, benign. No new pulmonary nodule. Unchanged calcified granuloma in the posterior right upper lobe. Chronic biapical pleuroparenchymal scarring, similar to prior study. Upper Abdomen: No acute abnormality. Small calcifications in the left adrenal gland again noted, likely related to prior hemorrhage. Musculoskeletal: No chest wall abnormality. No acute or significant osseous  findings. Review of the MIP images confirms the above findings. IMPRESSION: 1. Small acute nonocclusive subsegmental pulmonary emboli in the left upper lobe, right middle lobe, and both lower lobes. 2.  Aortic atherosclerosis (ICD10-I70.0). Critical Value/emergent results were called by telephone at the time of interpretation on 08/16/2019 at 2:56 pm to provider [facility] , who verbally acknowledged these results. Electronically Signed   By: Titus Dubin M.D.   On: 08/16/2019 14:57    Procedures .Critical Care Performed by: Noemi Chapel, MD Authorized by: Noemi Chapel, MD   Critical care provider statement:    Critical care time (minutes):  35   Critical care time was exclusive of:  Separately billable procedures and treating other patients and teaching time   Critical care was necessary to treat or prevent imminent or life-threatening deterioration of the following conditions:  Circulatory failure (PE)   Critical care was time spent personally by me on the following activities:  Blood draw for specimens, development of treatment plan with patient or surrogate, discussions with consultants, evaluation of patient's response to treatment, examination of patient, obtaining history from patient or surrogate, ordering and performing treatments and interventions, ordering and review of laboratory studies, ordering and review of radiographic studies, pulse oximetry, re-evaluation of patient's condition and review of old charts   (including critical care time)  Medications Ordered in ED Medications  heparin ADULT infusion 100 units/mL (25000 units/241mL sodium chloride 0.45%) (900 Units/hr Intravenous New Bag/Given 08/16/19 1701)  heparin bolus via infusion 2,700 Units (2,700 Units Intravenous Bolus from Bag 08/16/19 1702)    ED Course  I have reviewed the triage vital signs and the nursing notes.  Pertinent labs & imaging results that were available during my care of the patient were  reviewed by me and considered in my medical decision making (see chart for details).    MDM Rules/Calculators/A&P                       This patient has a pulmonary embolism i and in fact has multiple pulmonary emboli.  She will need anticoagulation at admission, likely needs an echocardiogram she is tachycardic but  not hypoxic.  This patient is critically ill with a new vascular thromboembolism.  She will need to be admitted.  The patient will need to be admitted to the hospital, she is mildly tachypneic, I have discussed her case with the hospitalist, she is currently on a heparin drip to be treated for her thromboembolism with multiple pulmonary emboli.  Discussed with hospitalist who is agreeable to admit at 6:36 PM  Labs are otherwise reassuring with no anemia, no leukocytosis and an unremarkable metabolic panel.  There is a normal troponin, she does have recurrent pulmonary embolism suggestive that she will need a longer period if not indefinite anticoagulation  Final Clinical Impression(s) / ED Diagnoses Final diagnoses:  Multiple subsegmental pulmonary emboli without acute cor pulmonale (HCC)      Eber Hong, MD 08/16/19 1836

## 2019-08-16 NOTE — H&P (Signed)
TRH H&P    Patient Demographics:    Cheryl Swanson, is a 86 y.o. female  MRN: [id number]  DOB - Feb 09, 1932  Admit Date - 08/16/2019  Referring MD/NP/PA: Hyacinth Meeker  Outpatient Primary MD for the patient is [name], MD  Patient coming from: Home -lives alone but daughter checks on her  Chief complaint-fatigue   HPI:    Angela Vazguez  is a 86 y.o. female, with a history of rheumatoid arthritis who presents for chief complaint of fatigue.  Patient reports that she knew she had a blood clot because she was feeling fatigued.  She reports that this started 2 weeks ago and has been getting progressively worse.  She notices shortness of breath with long conversations, and exertion.  She denies chest pain and palpitations.  She denies unilateral swelling of her lower extremities.  Patient reports that she had a blood clot last fall.  She was on a blood thinner, Coumadin, and it was discontinued on January 1.  Patient reports that she was doing all right until 2 weeks ago.  She was in her normal state of health -and then the fatigue sent in.  Patient reports that she saw her primary care physician who then sent her to the ER for CT scan.  In the ED Temperature 98.6, pulse 110, respiratory rate 22, blood pressure 161/80, saturating 100%. Afebrile no leukocytosis Electrolytes reveal a hypokalemia at 3.4 Trop 7 INR 1.1 TSH from 3 weeks ago was 3.167 CTA chest shows small acute nonocclusive sub segmental pulmonary emboli in left upper lobe, right middle lobe, and both lower lobes.  Aortic atherosclerosis EKG showed sinus tachycardia with a heart rate of 105 and QTC 420 Patient was started on full dose heparin drip.    Review of systems:    In addition to the HPI above,  No Fever-chills, No Headache, No changes with Vision or hearing, No problems swallowing food or Liquids, No Chest pain, Cough positive for shortness  of Breath, No Abdominal pain, No Nausea or Vomiting, bowel movements are regular, No Blood in stool or Urine, No dysuria, No new skin rashes or bruises, No new joints pains-aches,  No new weakness, tingling, numbness in any extremity, No recent weight gain or loss, No polyuria, polydypsia or polyphagia, No significant Mental Stressors.  All other systems reviewed and are negative.    Past History of the following :    Past Medical History:  Diagnosis Date  . Glaucoma   . Mitral valve disorder   . Peptic ulcer   . Rheumatoid arthritis(714.0)   . Tricuspid valve disorder       Past Surgical History:  Procedure Laterality Date  . ABDOMINAL HYSTERECTOMY  1971  . HAND SURGERY  1980's   left      Social History:      Social History   Tobacco Use  . Smoking status: Never Smoker  . Smokeless tobacco: Never Used  Substance Use Topics  . Alcohol use: No       Family History :  Family History  Problem Relation Age of Onset  . Cancer Mother   . Cancer Father   . Cancer Brother   . Cancer Brother   . Cancer Son        prostate-remission      Home Medications:   Prior to Admission medications   Medication Sig Start Date End Date Taking? Authorizing Provider  albuterol (PROVENTIL HFA;VENTOLIN HFA) 108 (90 Base) MCG/ACT inhaler Inhale 2 puffs into the lungs every 4 (four) hours as needed for wheezing or shortness of breath. 07/13/15  Yes Eber Hong, MD  calcium-vitamin D (OSCAL WITH D) 500-200 MG-UNIT per tablet Take 1 tablet by mouth 2 (two) times daily.   Yes [provider]  Glycerin-Hypromellose-PEG 400 (DRY EYE RELIEF DROPS) 0.2-0.2-1 % SOLN Apply 1 drop to eye in the morning and at bedtime.   Yes [provider]  latanoprost (XALATAN) 0.005 % ophthalmic solution Place 1 drop into both eyes at bedtime.    Yes [provider]  megestrol (MEGACE) 40 MG/ML suspension Take 400 mg by mouth daily. 07/20/19  Yes [provider]  methotrexate (RHEUMATREX) 2.5 MG tablet Take 9 tablets by mouth every Monday.  10/05/12  Yes [provider]  predniSONE (DELTASONE) 10 MG tablet Take 10 mg by mouth See admin instructions. Take one tablet twice daily for 2 days, then take one tablet daily until gone as needed for pain relief 06/09/18  Yes [provider]  simvastatin (ZOCOR) 20 MG tablet Take 20 mg by mouth every evening.  10/05/12  Yes [provider]  timolol (BETIMOL) 0.5 % ophthalmic solution Place 1 drop into both eyes 2 (two) times daily.   Yes [provider]  traMADol (ULTRAM) 50 MG tablet Take 50 mg by mouth 3 (three) times daily as needed for moderate pain or severe pain.  08/16/19  Yes [provider]     Allergies:    No Known Allergies   Physical Exam:   Vitals  Blood pressure 119/75, pulse 93, temperature 98.6 F (37 C), temperature source Oral, resp. rate 20, height 5\' 6"  (1.676 m), weight 54.4 kg, SpO2 98 %.  1.  General: Lying supine in bed in no acute distress  2. Psychiatric: Pleasant and cooperative with interview  3. Neurologic: Cranial nerves II through XII are grossly intact, moves all 4 extremities voluntarily EOMI Pulse reactive to light  4. HEENMT:  Head is atraumatic normocephalic As above EOMI, pupils reactive to light Lucas membranes are moist Trachea is midline External ears normal  5. Respiratory : Lungs clear to auscultation bilaterally  6. Cardiovascular : Heart rate tachycardic, rhythm is regular  7. Gastrointestinal:  Abdomen is soft, nondistended, nontender to palpation  8. Skin:  No acute lesions on limited skin exam   9.Musculoskeletal:  Arthritic deformities of bilateral feet    Data Review:    CBC Recent Labs  Lab 08/16/19 1622  WBC 8.2  HGB 16.2*  HCT 48.8*  PLT 337  MCV 94.9  MCH 31.5  MCHC 33.2  RDW 14.3  LYMPHSABS 1.9  MONOABS 1.1*  EOSABS 0.1  BASOSABS 0.1    ------------------------------------------------------------------------------------------------------------------  Results for orders placed or performed during the hospital encounter of 08/16/19 (from the past 48 hour(s))  CBC with Differential/Platelet     Status: Abnormal   Collection Time: 08/16/19  4:22 PM  Result Value Ref Range   WBC 8.2 4.0 - 10.5 K/uL   RBC 5.14 (H) 3.87 - 5.11 MIL/uL  Hemoglobin 16.2 (H) 12.0 - 15.0 g/dL   HCT 48.8 (H) 36.0 - 46.0 %   MCV 94.9 80.0 - 100.0 fL   MCH 31.5 26.0 - 34.0 pg   MCHC 33.2 30.0 - 36.0 g/dL   RDW 14.3 11.5 - 15.5 %   Platelets 337 150 - 400 K/uL   nRBC 0.0 0.0 - 0.2 %   Neutrophils Relative % 59 %   Neutro Abs 4.9 1.7 - 7.7 K/uL   Lymphocytes Relative 23 %   Lymphs Abs 1.9 0.7 - 4.0 K/uL   Monocytes Relative 14 %   Monocytes Absolute 1.1 (H) 0.1 - 1.0 K/uL   Eosinophils Relative 2 %   Eosinophils Absolute 0.1 0.0 - 0.5 K/uL   Basophils Relative 1 %   Basophils Absolute 0.1 0.0 - 0.1 K/uL   Immature Granulocytes 1 %   Abs Immature Granulocytes 0.06 0.00 - 0.07 K/uL    Comment: Performed at [facility], [address] [place], [place] [address]  Basic metabolic panel     Status: Abnormal   Collection Time: 08/16/19  4:22 PM  Result Value Ref Range   Sodium 136 135 - 145 mmol/L   Potassium 3.4 (L) 3.5 - 5.1 mmol/L   Chloride 103 98 - 111 mmol/L   CO2 24 22 - 32 mmol/L   Glucose, Bld 103 (H) 70 - 99 mg/dL    Comment: Glucose reference range applies only to samples taken after fasting for at least 8 hours.   BUN 12 8 - 23 mg/dL   Creatinine, Ser 0.83 0.44 - 1.00 mg/dL   Calcium 9.9 8.9 - 10.3 mg/dL   GFR calc non Af Amer >60 >60 mL/min   GFR calc Af Amer >60 >60 mL/min   Anion gap 9 5 - 15    Comment: Performed at [facility], [address] [place], [place] [address]  Troponin I (High Sensitivity)     Status: None   Collection Time: 08/16/19  4:22 PM  Result Value Ref Range   Troponin I (High Sensitivity) 7 <18 ng/L     Comment: (NOTE) Elevated high sensitivity troponin I (hsTnI) values and significant  changes across serial measurements may suggest ACS but many other  chronic and acute conditions are known to elevate hsTnI results.  Refer to the "Links" section for chest pain algorithms and additional  guidance. Performed at [facility], [address] [place], [place] [address]   APTT     Status: None   Collection Time: 08/16/19  4:22 PM  Result Value Ref Range   aPTT 29 24 - 36 seconds    Comment: Performed at [facility], [address] [place], [place] [address]  Protime-INR     Status: None   Collection Time: 08/16/19  4:22 PM  Result Value Ref Range   Prothrombin Time 14.0 11.4 - 15.2 seconds   INR 1.1 0.8 - 1.2    Comment: (NOTE) INR goal varies based on device and disease states. Performed at [facility], [address] [place],  [address]     Chemistries  Recent Labs  Lab 08/16/19 1622  NA 136  K 3.4*  CL 103  CO2 24  GLUCOSE 103*  BUN 12  CREATININE 0.83  CALCIUM 9.9   ------------------------------------------------------------------------------------------------------------------  ------------------------------------------------------------------------------------------------------------------ GFR: Estimated Creatinine Clearance: 41.8 mL/min (by C-G formula based on SCr of 0.83 mg/dL). Liver Function Tests: No results for input(s): AST, ALT, ALKPHOS, BILITOT, PROT, ALBUMIN in the last 168 hours.  No results for input(s): LIPASE, AMYLASE in the last 168 hours. No results for input(s): AMMONIA in the last 168 hours. Coagulation Profile: Recent Labs  Lab 08/16/19 1622  INR 1.1   Cardiac Enzymes: No results for input(s): CKTOTAL, CKMB, CKMBINDEX, TROPONINI in the last 168 hours. BNP (last 3 results) No results for input(s): PROBNP in the last 8760 hours. HbA1C: No results for input(s): HGBA1C in the last 72 hours. CBG: No results for input(s): GLUCAP  in the last 168 hours. Lipid Profile: No results for input(s): CHOL, HDL, LDLCALC, TRIG, CHOLHDL, LDLDIRECT in the last 72 hours. Thyroid Function Tests: No results for input(s): TSH, T4TOTAL, FREET4, T3FREE, THYROIDAB in the last 72 hours. Anemia Panel: No results for input(s): VITAMINB12, FOLATE, FERRITIN, TIBC, IRON, RETICCTPCT in the last 72 hours.  --------------------------------------------------------------------------------------------------------------- Urine analysis: No results found for: COLORURINE, APPEARANCEUR, LABSPEC, PHURINE, GLUCOSEU, HGBUR, BILIRUBINUR, KETONESUR, PROTEINUR, UROBILINOGEN, NITRITE, LEUKOCYTESUR    Imaging Results:    CT ANGIO CHEST PE W OR WO CONTRAST  Result Date: 08/16/2019 CLINICAL DATA:  Shortness of breath for the past 2 weeks. EXAM: CT ANGIOGRAPHY CHEST WITH CONTRAST TECHNIQUE: Multidetector CT imaging of the chest was performed using the standard protocol during bolus administration of intravenous contrast. Multiplanar CT image reconstructions and MIPs were obtained to evaluate the vascular anatomy. CONTRAST:  28mL OMNIPAQUE IOHEXOL 350 MG/ML SOLN COMPARISON:  Chest x-ray dated June 10, 2018. CT chest dated April 14, 2012. FINDINGS: Cardiovascular: Small acute nonocclusive subsegmental pulmonary emboli in the left upper lobe, right middle lobe, and both lower lobes. Normal heart size. No pericardial effusion. No thoracic aortic aneurysm dissection. Minimal atherosclerotic calcification of the aortic arch. Mediastinum/Nodes: No enlarged mediastinal, hilar, or axillary lymph nodes. Thyroid gland, trachea, and esophagus demonstrate no significant findings. Lungs/Pleura: No focal consolidation, pleural effusion, or pneumothorax. Unchanged 4 mm ground-glass nodule in the right upper lobe (series 6, image 60), stable since 2013, benign. No new pulmonary nodule. Unchanged calcified granuloma in the posterior right upper lobe. Chronic biapical  pleuroparenchymal scarring, similar to prior study. Upper Abdomen: No acute abnormality. Small calcifications in the left adrenal gland again noted, likely related to prior hemorrhage. Musculoskeletal: No chest wall abnormality. No acute or significant osseous findings. Review of the MIP images confirms the above findings. IMPRESSION: 1. Small acute nonocclusive subsegmental pulmonary emboli in the left upper lobe, right middle lobe, and both lower lobes. 2.  Aortic atherosclerosis (ICD10-I70.0). Critical Value/emergent results were called by telephone at the time of interpretation on 08/16/2019 at 2:56 pm to provider [facility] , who verbally acknowledged these results. Electronically Signed   By: Obie Dredge M.D.   On: 08/16/2019 14:57    My personal review of EKG: Rhythm NSR, Rate 105 /min, QTc 420 ,no Acute ST changes   Assessment & Plan:    Active Problems:   Pulmonary embolism (HCC)   1. Pulmonary embolism  1. Multiple PE on CTA 2. Heparin started in ED 3. Continue Heparin 4. Hx of DVT req short term therapy with coumadin. Coumadin stopped in Jan 2021 5. Advised patient that she will likely need to be on coumadin again 6. Troponin normal 7. EKG - Sinus tach with a rate of 105, QTC 420 8. Echo to assess for right heart strain 9. Ultrasound lower extremities to assess for clot burden 10. Continue to monitor 2. Hypokalemia 1. K+ 3.4 2. PO K+ ordered   DVT Prophylaxis-   Full dose heparin, and SCDs  AM Labs Ordered, also  please review Full Orders  Family Communication: Admission, patients condition and plan of care including tests being ordered have been discussed with the patient and daughter who indicate understanding and agree with the plan and Code Status.   Code Status:  Full  Admission status: Inpatient :The appropriate admission status for this patient is INPATIENT. Inpatient status is judged to be reasonable and necessary in order to provide the required  intensity of service to ensure the patient's safety. The patient's presenting symptoms, physical exam findings, and initial radiographic and laboratory data in the context of their chronic comorbidities is felt to place them at high risk for further clinical deterioration. Furthermore, it is not anticipated that the patient will be medically stable for discharge from the hospital within 2 midnights of admission. The following factors support the admission status of inpatient.     The patient's presenting symptoms include Fatigue and dyspnea The initial radiographic and laboratory data are worrisome because of multiple pulmonary emboli The chronic co-morbidities include Hx of DVT        I certify that at the point of admission it is my clinical judgment that the patient will require inpatient hospital care spanning beyond 2 midnights from the point of admission due to high intensity of service, high risk for further deterioration and high frequency of surveillance required.  Time spent in minutes : 65   Keerthana Vanrossum B Zierle-Ghosh DO

## 2019-08-16 NOTE — ED Triage Notes (Signed)
Pt reports she had been SOB and weak for 2 weeks. She was seen by here PCP this morning due to multiple Blood clots in lungs

## 2019-08-16 NOTE — Progress Notes (Signed)
ANTICOAGULATION CONSULT NOTE   Pharmacy Consult for heparin infusion Indication: pulmonary embolus  No Known Allergies  Patient Measurements: Height: 5\' 6"  (167.6 cm) Weight: 120 lb (54.4 kg) IBW/kg (Calculated) : 59.3 Heparin Dosing Weight: HEPARIN DW (KG): 54.4   Vital Signs: Temp: 98.6 F (37 C) (03/29 1608) Temp Source: Oral (03/29 1608) BP: 161/80 (03/29 1608) Pulse Rate: 110 (03/29 1608)  Labs: No results for input(s): HGB, HCT, PLT, APTT, LABPROT, INR, HEPARINUNFRC, HEPRLOWMOCWT, CREATININE, CKTOTAL, CKMB, TROPONINIHS in the last 72 hours.  CrCl cannot be calculated (Patient's most recent lab result is older than the maximum 21 days allowed.).   Medical History: Past Medical History:  Diagnosis Date  . Glaucoma   . Mitral valve disorder   . Peptic ulcer   . Rheumatoid arthritis(714.0)   . Tricuspid valve disorder     Assessment: Pharmacy consulted to dose heparin infusion for this 86 yo female sent to the ED by her PCP's office  for multiple blood clots in lungs. Patient wasn't on anti-coagulation prior to admission.  Baseline coags and CBC are in process.  Goal of Therapy:  Heparin level 0.3-0.7 units/ml Monitor platelets by anticoagulation protocol: Yes   Plan:  Give 2700 units bolus x 1 Start heparin infusion at 900 units/hr Check anti-Xa level in 6-8 hours and daily while on heparin Continue to monitor H&H and platelets  88 08/16/2019,4:34 PM

## 2019-08-17 ENCOUNTER — Inpatient Hospital Stay (HOSPITAL_COMMUNITY): Payer: Medicare Other

## 2019-08-17 DIAGNOSIS — I340 Nonrheumatic mitral (valve) insufficiency: Secondary | ICD-10-CM

## 2019-08-17 LAB — COMPREHENSIVE METABOLIC PANEL
ALT: 8 U/L (ref 0–44)
AST: 17 U/L (ref 15–41)
Albumin: 3.1 g/dL — ABNORMAL LOW (ref 3.5–5.0)
Alkaline Phosphatase: 41 U/L (ref 38–126)
Anion gap: 9 (ref 5–15)
BUN: 11 mg/dL (ref 8–23)
CO2: 21 mmol/L — ABNORMAL LOW (ref 22–32)
Calcium: 9.5 mg/dL (ref 8.9–10.3)
Chloride: 109 mmol/L (ref 98–111)
Creatinine, Ser: 0.63 mg/dL (ref 0.44–1.00)
GFR calc Af Amer: >60 mL/min (ref >60)
GFR calc non Af Amer: >60 mL/min (ref >60)
Glucose, Bld: 81 mg/dL (ref 70–99)
Potassium: 4 mmol/L (ref 3.5–5.1)
Sodium: 139 mmol/L (ref 135–145)
Total Bilirubin: 1.8 mg/dL — ABNORMAL HIGH (ref 0.3–1.2)
Total Protein: 6.5 g/dL (ref 6.5–8.1)

## 2019-08-17 LAB — CBC WITH DIFFERENTIAL/PLATELET
Abs Immature Granulocytes: 0.03 10*3/uL (ref 0.00–0.07)
Basophils Absolute: 0.1 10*3/uL (ref 0.0–0.1)
Basophils Relative: 1 %
Eosinophils Absolute: 0.2 10*3/uL (ref 0.0–0.5)
Eosinophils Relative: 3 %
HCT: 45.7 % (ref 36.0–46.0)
Hemoglobin: 15.1 g/dL — ABNORMAL HIGH (ref 12.0–15.0)
Immature Granulocytes: 1 %
Lymphocytes Relative: 35 %
Lymphs Abs: 2.1 10*3/uL (ref 0.7–4.0)
MCH: 31.5 pg (ref 26.0–34.0)
MCHC: 33 g/dL (ref 30.0–36.0)
MCV: 95.2 fL (ref 80.0–100.0)
Monocytes Absolute: 1 10*3/uL (ref 0.1–1.0)
Monocytes Relative: 17 %
Neutro Abs: 2.6 10*3/uL (ref 1.7–7.7)
Neutrophils Relative %: 43 %
Platelets: 317 10*3/uL (ref 150–400)
RBC: 4.8 MIL/uL (ref 3.87–5.11)
RDW: 14.1 % (ref 11.5–15.5)
WBC: 6 10*3/uL (ref 4.0–10.5)
nRBC: 0 % (ref 0.0–0.2)

## 2019-08-17 LAB — ECHOCARDIOGRAM COMPLETE
Height: 66 in
Weight: 1869.5 oz

## 2019-08-17 LAB — HEPARIN LEVEL (UNFRACTIONATED)
Heparin Unfractionated: 0.58 IU/mL (ref 0.30–0.70)
Heparin Unfractionated: 0.8 IU/mL — ABNORMAL HIGH (ref 0.30–0.70)
Heparin Unfractionated: 0.88 IU/mL — ABNORMAL HIGH (ref 0.30–0.70)

## 2019-08-17 LAB — MAGNESIUM: Magnesium: 2.2 mg/dL (ref 1.7–2.4)

## 2019-08-17 MED ORDER — WARFARIN - PHARMACIST DOSING INPATIENT: Freq: Every day | Status: DC

## 2019-08-17 MED ORDER — WARFARIN SODIUM 5 MG PO TABS
5.0000 mg | ORAL_TABLET | Freq: Once | ORAL | Status: AC
  Administered 2019-08-17: 5 mg via ORAL
  Filled 2019-08-17: qty 1

## 2019-08-17 NOTE — Progress Notes (Signed)
ANTICOAGULATION CONSULT NOTE   Pharmacy Consult for heparin infusion and warfarin Indication: pulmonary embolus  No Known Allergies  Patient Measurements: Height: 5\' 6"  (167.6 cm) Weight: 116 lb 13.5 oz (53 kg) IBW/kg (Calculated) : 59.3 Heparin Dosing Weight: HEPARIN DW (KG): 53   Vital Signs: Temp: 99.2 F (37.3 C) (03/30 2106) Temp Source: Oral (03/30 2106) BP: 121/74 (03/30 2106) Pulse Rate: 86 (03/30 2106)  Labs: Recent Labs    08/16/19 1622 08/17/19 0010 08/17/19 0543 08/17/19 1030 08/17/19 2001  HGB 16.2*  --  15.1*  --   --   HCT 48.8*  --  45.7  --   --   PLT 337  --  317  --   --   APTT 29  --   --   --   --   LABPROT 14.0  --   --   --   --   INR 1.1  --   --   --   --   HEPARINUNFRC  --  0.88*  --  0.80* 0.58  CREATININE 0.83  --  0.63  --   --   TROPONINIHS 7  --   --   --   --     Estimated Creatinine Clearance: 42.2 mL/min (by C-G formula based on SCr of 0.63 mg/dL).   Medical History: Past Medical History:  Diagnosis Date  . Glaucoma   . Mitral valve disorder   . Peptic ulcer   . Rheumatoid arthritis(714.0)   . Tricuspid valve disorder     Assessment: Pharmacy consulted to dose heparin infusion for this 86 yo female sent to the ED by her PCP's office  for multiple blood clots in lungs. Patient stopped taking warfarin in January for unknown reason.  Heparin level came back therapeutic this PM. We will continue with the same rate and check confirm level in AM.  Goal of Therapy:  INR 2-3  Heparin level 0.3-0.7 units/ml Monitor platelets by anticoagulation protocol: Yes   Plan:  Warfarin 5mg  po x 1  INR daily  Cont heparin 650 units / hr Heparin level daily Monitor labs and s/s of bleeding.  February, PharmD, BCIDP, AAHIVP, CPP Infectious Disease Pharmacist 08/17/2019 9:41 PM

## 2019-08-17 NOTE — Progress Notes (Signed)
  Echocardiogram 2D Echocardiogram has been performed.  Pieter Partridge 08/17/2019, 1:49 PM

## 2019-08-17 NOTE — Progress Notes (Signed)
ANTICOAGULATION CONSULT NOTE   Pharmacy Consult for heparin infusion Indication: pulmonary embolus  No Known Allergies  Patient Measurements: Height: 5\' 6"  (167.6 cm) Weight: 116 lb 13.5 oz (53 kg) IBW/kg (Calculated) : 59.3 Heparin Dosing Weight: HEPARIN DW (KG): 53   Vital Signs: Temp: 97.9 F (36.6 C) (03/30 1003) Temp Source: Oral (03/30 0212) BP: 133/75 (03/30 1003) Pulse Rate: 94 (03/30 1003)  Labs: Recent Labs    08/16/19 1622 08/17/19 0010 08/17/19 0543 08/17/19 1030  HGB 16.2*  --  15.1*  --   HCT 48.8*  --  45.7  --   PLT 337  --  317  --   APTT 29  --   --   --   LABPROT 14.0  --   --   --   INR 1.1  --   --   --   HEPARINUNFRC  --  0.88*  --  0.80*  CREATININE 0.83  --  0.63  --   TROPONINIHS 7  --   --   --     Estimated Creatinine Clearance: 42.2 mL/min (by C-G formula based on SCr of 0.63 mg/dL).   Medical History: Past Medical History:  Diagnosis Date  . Glaucoma   . Mitral valve disorder   . Peptic ulcer   . Rheumatoid arthritis(714.0)   . Tricuspid valve disorder     Assessment: Pharmacy consulted to dose heparin infusion for this 86 yo female sent to the ED by her PCP's office  for multiple blood clots in lungs. Patient stopped taking warfarin in January for unknown reason.   heparin level elevated at 0.80  Goal of Therapy:  Heparin level 0.3-0.7 units/ml Monitor platelets by anticoagulation protocol: Yes   Plan:  Decrease heparin to 650 units / hr Heparin level 8 hours after heparin decreased Monitor labs and s/s of bleeding.  February, PharmD Clinical Pharmacist 08/17/2019 11:21 AM

## 2019-08-17 NOTE — Progress Notes (Signed)
ANTICOAGULATION CONSULT NOTE   Pharmacy Consult for heparin infusion Indication: pulmonary embolus  No Known Allergies  Patient Measurements: Height: 5\' 6"  (167.6 cm) Weight: 116 lb 13.5 oz (53 kg) IBW/kg (Calculated) : 59.3 Heparin Dosing Weight: HEPARIN DW (KG): 53   Vital Signs: Temp: 97.8 F (36.6 C) (03/29 2050) Temp Source: Oral (03/29 2050) BP: 134/83 (03/29 2050) Pulse Rate: 91 (03/29 2050)  Labs: Recent Labs    08/16/19 1622 08/17/19 0010  HGB 16.2*  --   HCT 48.8*  --   PLT 337  --   APTT 29  --   LABPROT 14.0  --   INR 1.1  --   HEPARINUNFRC  --  0.88*  CREATININE 0.83  --   TROPONINIHS 7  --     Estimated Creatinine Clearance: 40.7 mL/min (by C-G formula based on SCr of 0.83 mg/dL).   Medical History: Past Medical History:  Diagnosis Date  . Glaucoma   . Mitral valve disorder   . Peptic ulcer   . Rheumatoid arthritis(714.0)   . Tricuspid valve disorder     Assessment: Pharmacy consulted to dose heparin infusion for this 86 yo female sent to the ED by her PCP's office  for multiple blood clots in lungs. Patient wasn't on anti-coagulation prior to admission  Initial heparin level elevated at 0.88  Goal of Therapy:  Heparin level 0.3-0.7 units/ml Monitor platelets by anticoagulation protocol: Yes   Plan:  Decrease heparin to 800 units / hr Heparin level 8 hours after heparin decreased  Thank you 88, PharmD  08/17/2019,1:04 AM

## 2019-08-17 NOTE — Progress Notes (Addendum)
Patient Demographics:    Cheryl Swanson, is a 86 y.o. female, DOB - 1932-02-27, PFX:[id number]  Admit date - 08/16/2019   Admitting Physician Lilyan Gilford, DO  Outpatient Primary MD for the patient is [name], MD  LOS - 1   Chief Complaint  Patient presents with  . Shortness of Breath        Subjective:    Cheryl Swanson today has no fevers, no emesis,  No chest pain,  --fatigue persist no shortness of breath at rest patient does have  dyspnea on exertion  Assessment  & Plan :    Principal Problem:   Pulmonary embolism (HCC) Active Problems:   Fatigue   Rheumatoid arthritis [facility])  Brief Summary- 86 y.o. female, with a history of rheumatoid arthritis and history of prior DVT in September 2020 admitted on 08/16/2019 with dyspnea on exertion and fatigue and found to have bilateral PE   A/p  1)Bil PE--- CT chest with small acute nonocclusive subsegmental pulmonary embolism in both lungs----patient was diagnosed with DVT in September 2020 treated with Coumadin through January 2021 after which Coumadin was discontinued -No new provoking factors -Last imaging venous Dopplers at this time without new DVT -Echo from 08/17/2019 with preserved EF of 60 to 65% without regional wall motion normalities patient does have grade 1 diastolic dysfunction, no pulmonary hypertension or right heart strain -Currently on IV heparin okay to restart Coumadin -Most likely discharge on 08/18/2019 on Lovenox bridge with p.o. Coumadin  2) history of rheumatoid arthritis--- patient was on methotrexate PTA -She will have to avoid NSAIDs for arthralgia given reinitiation of anticoagulation  3) generalized weakness and ambulatory dysfunction----get PT eval, PT to also check O2 sats with ambulation  Disposition/Need for in-Hospital Stay- patient unable to be discharged at this time due to --IV heparin for bilateral  PE -Patient From: home D/C Place: home -Currently on IV heparin okay to restart Coumadin -Most likely discharge on 08/18/2019 on Lovenox bridge with p.o. Coumadin Barriers: Not Clinically Stable-   Code Status : full  Family Communication:   NA (patient is alert, awake and coherent)  Consults  :  na  DVT Prophylaxis  : IV heparin  Lab Results  Component Value Date   PLT 317 08/17/2019    Inpatient Medications  Scheduled Meds: . calcium-vitamin D  1 tablet Oral BID  . simvastatin  20 mg Oral Daily  . [START ON 08/18/2019] Warfarin - Pharmacist Dosing Inpatient   Does not apply q1600   Continuous Infusions: . heparin 650 Units/hr (08/17/19 1215)   PRN Meds:.albuterol, LORazepam, ondansetron **OR** ondansetron (ZOFRAN) IV, traMADol    Anti-infectives (From admission, onward)   None        Objective:   Vitals:   08/16/19 2050 08/17/19 0212 08/17/19 0608 08/17/19 1003  BP: 134/83 107/62 115/63 133/75  Pulse: 91 80 77 94  Resp: 16 19 16 18   Temp: 97.8 F (36.6 C) 98.9 F (37.2 C) 98.8 F (37.1 C) 97.9 F (36.6 C)  TempSrc: Oral Oral    SpO2: 97% 99% 100% 99%  Weight: 53 kg     Height: 5\' 6"  (1.676 m)       Wt Readings from Last 3 Encounters:  08/16/19 53 kg  07/09/18 54.9 kg  07/13/15 55.8 kg     Intake/Output Summary (Last 24 hours) at 08/17/2019 1936 Last data filed at 08/17/2019 1300 Gross per 24 hour  Intake 856.35 ml  Output --  Net 856.35 ml     Physical Exam  Gen:- Awake Alert, dyspnea on exertion HEENT:- [place].AT, No sclera icterus Neck-Supple Neck,No JVD,.  Lungs-diminished in bases, no wheezing CV- S1, S2 normal, regular  Abd-  +ve B.Sounds, Abd Soft, No tenderness,    Extremity/Skin:- No  edema, pedal pulses present  Psych-affect is appropriate, oriented x3 Neuro-generalized weakness no new focal deficits, no tremors   Data Review:   Micro Results Recent Results (from the past 240 hour(s))  SARS CORONAVIRUS 2 (TAT 6-24 HRS)  Nasopharyngeal Nasopharyngeal Swab     Status: None   Collection Time: 08/16/19  6:23 PM   Specimen: Nasopharyngeal Swab  Result Value Ref Range Status   SARS Coronavirus 2 NEGATIVE NEGATIVE Final    Comment: (NOTE) SARS-CoV-2 target nucleic acids are NOT DETECTED. The SARS-CoV-2 RNA is generally detectable in upper and lower respiratory specimens during the acute phase of infection. Negative results do not preclude SARS-CoV-2 infection, do not rule out co-infections with other pathogens, and should not be used as the sole basis for treatment or other patient management decisions. Negative results must be combined with clinical observations, patient history, and epidemiological information. The expected result is Negative. Fact Sheet for Patients: HairSlick.no Fact Sheet for Healthcare Providers: quierodirigir.com This test is not yet approved or cleared by the [place] FDA and  has been authorized for detection and/or diagnosis of SARS-CoV-2 by FDA under an Emergency Use Authorization (EUA). This EUA will remain  in effect (meaning this test can be used) for the duration of the COVID-19 declaration under Section 56 4(b)(1) of the Act, 21 U.S.C. section 360bbb-3(b)(1), unless the authorization is terminated or revoked sooner. Performed at [facility], [address] [place], [place] [address]     Radiology Reports CT ANGIO CHEST PE W OR WO CONTRAST  Result Date: 08/16/2019 CLINICAL DATA:  Shortness of breath for the past 2 weeks. EXAM: CT ANGIOGRAPHY CHEST WITH CONTRAST TECHNIQUE: Multidetector CT imaging of the chest was performed using the standard protocol during bolus administration of intravenous contrast. Multiplanar CT image reconstructions and MIPs were obtained to evaluate the vascular anatomy. CONTRAST:  79mL OMNIPAQUE IOHEXOL 350 MG/ML SOLN COMPARISON:  Chest x-ray dated June 10, 2018. CT chest dated  April 14, 2012. FINDINGS: Cardiovascular: Small acute nonocclusive subsegmental pulmonary emboli in the left upper lobe, right middle lobe, and both lower lobes. Normal heart size. No pericardial effusion. No thoracic aortic aneurysm dissection. Minimal atherosclerotic calcification of the aortic arch. Mediastinum/Nodes: No enlarged mediastinal, hilar, or axillary lymph nodes. Thyroid gland, trachea, and esophagus demonstrate no significant findings. Lungs/Pleura: No focal consolidation, pleural effusion, or pneumothorax. Unchanged 4 mm ground-glass nodule in the right upper lobe (series 6, image 60), stable since 2013, benign. No new pulmonary nodule. Unchanged calcified granuloma in the posterior right upper lobe. Chronic biapical pleuroparenchymal scarring, similar to prior study. Upper Abdomen: No acute abnormality. Small calcifications in the left adrenal gland again noted, likely related to prior hemorrhage. Musculoskeletal: No chest wall abnormality. No acute or significant osseous findings. Review of the MIP images confirms the above findings. IMPRESSION: 1. Small acute nonocclusive subsegmental pulmonary emboli in the left upper lobe, right middle lobe, and both lower lobes. 2.  Aortic atherosclerosis (ICD10-I70.0). Critical Value/emergent results were called  by telephone at the time of interpretation on 08/16/2019 at 2:56 pm to provider [facility] , who verbally acknowledged these results. Electronically Signed   By: Titus Dubin M.D.   On: 08/16/2019 14:57   US Venous Img Lower Bilateral (DVT)  Result Date: 08/17/2019 CLINICAL DATA:  PE seen on CT. EXAM: BILATERAL LOWER EXTREMITY VENOUS DOPPLER ULTRASOUND TECHNIQUE: Gray-scale sonography with graded compression, as well as color Doppler and duplex ultrasound were performed to evaluate the lower extremity deep venous systems from the level of the common femoral vein and including the common femoral, femoral, profunda femoral, popliteal and  calf veins including the posterior tibial, peroneal and gastrocnemius veins when visible. The superficial great saphenous vein was also interrogated. Spectral Doppler was utilized to evaluate flow at rest and with distal augmentation maneuvers in the common femoral, femoral and popliteal veins. COMPARISON:  None. FINDINGS: RIGHT LOWER EXTREMITY Common Femoral Vein: No evidence of thrombus. Normal compressibility, respiratory phasicity and response to augmentation. Saphenofemoral Junction: No evidence of thrombus. Normal compressibility and flow on color Doppler imaging. Profunda Femoral Vein: No evidence of thrombus. Normal compressibility and flow on color Doppler imaging. Femoral Vein: No evidence of thrombus. Normal compressibility, respiratory phasicity and response to augmentation. Popliteal Vein: No evidence of thrombus. Normal compressibility, respiratory phasicity and response to augmentation. Calf Veins: The posterior tibial vein was unremarkable. The peroneal vein was not well visualized. Superficial Great Saphenous Vein: No evidence of thrombus. Normal compressibility. Venous Reflux:  None. Other Findings:  None. LEFT LOWER EXTREMITY Common Femoral Vein: No evidence of thrombus. Normal compressibility, respiratory phasicity and response to augmentation. Saphenofemoral Junction: No evidence of thrombus. Normal compressibility and flow on color Doppler imaging. Profunda Femoral Vein: No evidence of thrombus. Normal compressibility and flow on color Doppler imaging. Femoral Vein: No evidence of thrombus. Normal compressibility, respiratory phasicity and response to augmentation. Popliteal Vein: No evidence of thrombus. Normal compressibility, respiratory phasicity and response to augmentation. Calf Veins: The posterior tibial vein was unremarkable. The peroneal vein was not well visualized. Superficial Great Saphenous Vein: No evidence of thrombus. Normal compressibility. Venous Reflux:  None. Other  Findings:  None. IMPRESSION: No evidence of deep venous thrombosis in either lower extremity. Electronically Signed   By: Constance Holster M.D.   On: 08/17/2019 15:39   ECHOCARDIOGRAM COMPLETE  Result Date: 08/17/2019    ECHOCARDIOGRAM REPORT   Patient Name:   Cheryl Swanson Date of Exam: 08/17/2019 Medical Rec #:  [id number]      Height:       66.0 in Accession #:    [id number]     Weight:       116.8 lb Date of Birth:  [birth year]       BSA:          1.591 m Patient Age:    73 years       BP:           133/75 mmHg Patient Gender: F              HR:           94 bpm. Exam Location:  [facility] Procedure: 2D Echo, Cardiac Doppler and Color Doppler Indications:    Pulmonary embolus  History:        Patient has prior history of Echocardiogram examinations, most                 recent 06/18/2018. LE edema; Signs/Symptoms:Dyspnea.  Sonographer:    Dustin Flock RDCS  Referring Phys: [id number] ASIA B ZIERLE-GHOSH IMPRESSIONS  1. Left ventricular ejection fraction, by estimation, is 60 to 65%. The left ventricle has normal function. The left ventricle has no regional wall motion abnormalities. Left ventricular diastolic parameters are consistent with Grade I diastolic dysfunction (impaired relaxation).  2. Right ventricular systolic function is normal. The right ventricular size is normal. There is normal pulmonary artery systolic pressure.  3. The mitral valve is degenerative. Mild mitral valve regurgitation.  4. The aortic valve is tricuspid. Aortic valve regurgitation is not visualized. Mild aortic valve sclerosis is present, with no evidence of aortic valve stenosis.  5. The inferior vena cava is normal in size with greater than 50% respiratory variability, suggesting right atrial pressure of 3 mmHg. FINDINGS  Left Ventricle: Left ventricular ejection fraction, by estimation, is 60 to 65%. The left ventricle has normal function. The left ventricle has no regional wall motion abnormalities. The left ventricular  internal cavity size was normal in size. There is  no left ventricular hypertrophy. Left ventricular diastolic parameters are consistent with Grade I diastolic dysfunction (impaired relaxation). Indeterminate filling pressures. Right Ventricle: The right ventricular size is normal. No increase in right ventricular wall thickness. Right ventricular systolic function is normal. There is normal pulmonary artery systolic pressure. The tricuspid regurgitant velocity is 2.50 m/s, and  with an assumed right atrial pressure of 3 mmHg, the estimated right ventricular systolic pressure is 28.0 mmHg. Left Atrium: Left atrial size was normal in size. Right Atrium: Right atrial size was normal in size. Pericardium: There is no evidence of pericardial effusion. Mitral Valve: The mitral valve is degenerative in appearance. There is mild calcification of the mitral valve leaflet(s). Mild mitral valve regurgitation. Tricuspid Valve: The tricuspid valve is grossly normal. Tricuspid valve regurgitation is trivial. Aortic Valve: The aortic valve is tricuspid. . There is mild thickening of the aortic valve. Aortic valve regurgitation is not visualized. Mild aortic valve sclerosis is present, with no evidence of aortic valve stenosis. There is mild thickening of the aortic valve. Pulmonic Valve: The pulmonic valve was grossly normal. Pulmonic valve regurgitation is not visualized. Aorta: The aortic root is normal in size and structure. Venous: The inferior vena cava is normal in size with greater than 50% respiratory variability, suggesting right atrial pressure of 3 mmHg. IAS/Shunts: No atrial level shunt detected by color flow Doppler.  LEFT VENTRICLE PLAX 2D LVIDd:         2.93 cm  Diastology LVIDs:         2.18 cm  LV e' lateral:   6.74 cm/s LV PW:         0.93 cm  LV E/e' lateral: 13.7 LV IVS:        0.85 cm  LV e' medial:    7.29 cm/s LVOT diam:     2.00 cm  LV E/e' medial:  12.6 LV SV:         69 LV SV Index:   43 LVOT Area:      3.14 cm  RIGHT VENTRICLE RV Basal diam:  2.62 cm RV S prime:     18.30 cm/s LEFT ATRIUM             Index LA diam:        2.10 cm 1.32 cm/m LA Vol (A2C):   20.8 ml 13.07 ml/m LA Vol (A4C):   39.0 ml 24.51 ml/m LA Biplane Vol: 30.5 ml 19.17 ml/m  AORTIC VALVE LVOT Vmax:   104.00 cm/s  LVOT Vmean:  64.200 cm/s LVOT VTI:    0.220 m  AORTA Ao Root diam: 2.60 cm MITRAL VALVE                TRICUSPID VALVE MV Area (PHT): 3.33 cm     TR Peak grad:   25.0 mmHg MV Decel Time: 228 msec     TR Vmax:        250.00 cm/s MV E velocity: 92.20 cm/s MV A velocity: 129.00 cm/s  SHUNTS MV E/A ratio:  0.71         Systemic VTI:  0.22 m                             Systemic Diam: 2.00 cm Prentice Docker MD Electronically signed by Prentice Docker MD Signature Date/Time: 08/17/2019/2:26:50 PM    Final      CBC Recent Labs  Lab 08/16/19 1622 08/17/19 0543  WBC 8.2 6.0  HGB 16.2* 15.1*  HCT 48.8* 45.7  PLT 337 317  MCV 94.9 95.2  MCH 31.5 31.5  MCHC 33.2 33.0  RDW 14.3 14.1  LYMPHSABS 1.9 2.1  MONOABS 1.1* 1.0  EOSABS 0.1 0.2  BASOSABS 0.1 0.1    Chemistries  Recent Labs  Lab 08/16/19 1622 08/17/19 0543  NA 136 139  K 3.4* 4.0  CL 103 109  CO2 24 21*  GLUCOSE 103* 81  BUN 12 11  CREATININE 0.83 0.63  CALCIUM 9.9 9.5  MG  --  2.2  AST  --  17  ALT  --  8  ALKPHOS  --  41  BILITOT  --  1.8*   ------------------------------------------------------------------------------------------------------------------ No results for input(s): CHOL, HDL, LDLCALC, TRIG, CHOLHDL, LDLDIRECT in the last 72 hours.  No results found for: HGBA1C ------------------------------------------------------------------------------------------------------------------ No results for input(s): TSH, T4TOTAL, T3FREE, THYROIDAB in the last 72 hours.  Invalid input(s): FREET3 ------------------------------------------------------------------------------------------------------------------ No results for input(s):  VITAMINB12, FOLATE, FERRITIN, TIBC, IRON, RETICCTPCT in the last 72 hours.  Coagulation profile Recent Labs  Lab 08/16/19 1622  INR 1.1    No results for input(s): DDIMER in the last 72 hours.  Cardiac Enzymes No results for input(s): CKMB, TROPONINI, MYOGLOBIN in the last 168 hours.  Invalid input(s): CK ------------------------------------------------------------------------------------------------------------------ No results found for: BNP   Shon Hale M.D on 08/17/2019 at 7:36 PM  Go to [email or web address] - for contact info  [facility] - Office  [phone]

## 2019-08-17 NOTE — Progress Notes (Addendum)
ANTICOAGULATION CONSULT NOTE   Pharmacy Consult for heparin infusion and warfarin Indication: pulmonary embolus  No Known Allergies  Patient Measurements: Height: 5\' 6"  (167.6 cm) Weight: 116 lb 13.5 oz (53 kg) IBW/kg (Calculated) : 59.3 Heparin Dosing Weight: HEPARIN DW (KG): 53   Vital Signs: Temp: 97.9 F (36.6 C) (03/30 1003) BP: 133/75 (03/30 1003) Pulse Rate: 94 (03/30 1003)  Labs: Recent Labs    08/16/19 1622 08/17/19 0010 08/17/19 0543 08/17/19 1030  HGB 16.2*  --  15.1*  --   HCT 48.8*  --  45.7  --   PLT 337  --  317  --   APTT 29  --   --   --   LABPROT 14.0  --   --   --   INR 1.1  --   --   --   HEPARINUNFRC  --  0.88*  --  0.80*  CREATININE 0.83  --  0.63  --   TROPONINIHS 7  --   --   --     Estimated Creatinine Clearance: 42.2 mL/min (by C-G formula based on SCr of 0.63 mg/dL).   Medical History: Past Medical History:  Diagnosis Date  . Glaucoma   . Mitral valve disorder   . Peptic ulcer   . Rheumatoid arthritis(714.0)   . Tricuspid valve disorder     Assessment: Pharmacy consulted to dose heparin infusion for this 86 yo female sent to the ED by her PCP's office  for multiple blood clots in lungs. Patient stopped taking warfarin in January for unknown reason.   heparin level elevated at 0.80  Goal of Therapy:  INR 2-3  Heparin level 0.3-0.7 units/ml Monitor platelets by anticoagulation protocol: Yes   Plan:  Warfarin 5mg  po x 1  INR daily  Decrease heparin to 650 units / hr Heparin level 8 hours after heparin decreased Monitor labs and s/s of bleeding.  February, PharmD, MBA, BCGP Clinical Pharmacist  08/17/2019 4:19 PM

## 2019-08-18 DIAGNOSIS — R5383 Other fatigue: Secondary | ICD-10-CM

## 2019-08-18 LAB — PROTIME-INR
INR: 1.3 — ABNORMAL HIGH (ref 0.8–1.2)
Prothrombin Time: 15.6 seconds — ABNORMAL HIGH (ref 11.4–15.2)

## 2019-08-18 LAB — CBC
HCT: 46.3 % — ABNORMAL HIGH (ref 36.0–46.0)
Hemoglobin: 15.2 g/dL — ABNORMAL HIGH (ref 12.0–15.0)
MCH: 31.3 pg (ref 26.0–34.0)
MCHC: 32.8 g/dL (ref 30.0–36.0)
MCV: 95.3 fL (ref 80.0–100.0)
Platelets: 327 10*3/uL (ref 150–400)
RBC: 4.86 MIL/uL (ref 3.87–5.11)
RDW: 13.9 % (ref 11.5–15.5)
WBC: 5.3 10*3/uL (ref 4.0–10.5)
nRBC: 0 % (ref 0.0–0.2)

## 2019-08-18 LAB — HEPARIN LEVEL (UNFRACTIONATED): Heparin Unfractionated: 0.33 IU/mL (ref 0.30–0.70)

## 2019-08-18 MED ORDER — ENOXAPARIN SODIUM 60 MG/0.6ML ~~LOC~~ SOLN: 50.0000 mg | Freq: Two times a day (BID) | SUBCUTANEOUS | 0 refills | Status: DC

## 2019-08-18 MED ORDER — WARFARIN SODIUM 2.5 MG PO TABS: 2.5000 mg | ORAL_TABLET | Freq: Every day | ORAL | 0 refills | Status: DC

## 2019-08-18 MED ORDER — ENOXAPARIN SODIUM 60 MG/0.6ML ~~LOC~~ SOLN
50.0000 mg | Freq: Two times a day (BID) | SUBCUTANEOUS | Status: DC
  Administered 2019-08-18: 50 mg via SUBCUTANEOUS
  Filled 2019-08-18: qty 0.6

## 2019-08-18 MED ORDER — WARFARIN SODIUM 5 MG PO TABS
5.0000 mg | ORAL_TABLET | Freq: Once | ORAL | Status: AC
  Administered 2019-08-18: 12:00:00 5 mg via ORAL
  Filled 2019-08-18: qty 1

## 2019-08-18 NOTE — Evaluation (Signed)
Physical Therapy Evaluation Patient Details Name: Cheryl Swanson MRN: [id number] DOB: 1932-01-02 Today's Date: 08/18/2019   History of Present Illness  Cheryl Swanson  is a 86 y.o. female, with a history of rheumatoid arthritis who presents for chief complaint of fatigue.  Patient reports that she knew she had a blood clot because she was feeling fatigued.  She reports that this started 2 weeks ago and has been getting progressively worse.  She notices shortness of breath with long conversations, and exertion.  She denies chest pain and palpitations.  She denies unilateral swelling of her lower extremities.  Patient reports that she had a blood clot last fall.  She was on a blood thinner, Coumadin, and it was discontinued on January 1.  Patient reports that she was doing all right until 2 weeks ago.  She was in her normal state of health -and then the fatigue sent in.  Patient reports that she saw her primary care physician who then sent her to the ER for CT scan.    Clinical Impression  Patient functioning near baseline for functional mobility and gait, demonstrates slightly labored movement for bed mobility and transfers, required verbal cues to let arms swing during ambulation with good carryover, had no loss of balance ambulating in room/hallway without use of AD and tolerated sitting up in chair after therapy.  Plan:  Patient to be discharged home today and discharged from physical therapy to care of nursing for ambulation as tolerated for length of stay.     Follow Up Recommendations Home health PT;Supervision - Intermittent    Equipment Recommendations  None recommended by PT    Recommendations for Other Services       Precautions / Restrictions Precautions Precautions: None Restrictions Weight Bearing Restrictions: No      Mobility  Bed Mobility Overal bed mobility: Modified Independent             General bed mobility comments: slightly increased time  Transfers Overall  transfer level: Modified independent Equipment used: None             General transfer comment: slightly increased time  Ambulation/Gait Ambulation/Gait assistance: Modified independent (Device/Increase time) Gait Distance (Feet): 125 Feet Assistive device: None Gait Pattern/deviations: WFL(Within Functional Limits) Gait velocity: slightly decreased   General Gait Details: grossly WFL, demonstrates good return for ambulation in room, hallways, verbal cues to let arms swing with good carryover and no loss of balance  Stairs            Wheelchair Mobility    Modified Rankin (Stroke Patients Only)       Balance Overall balance assessment: Mild deficits observed, not formally tested                                           Pertinent Vitals/Pain Pain Assessment: No/denies pain    Home Living Family/patient expects to be discharged to:: Private residence Living Arrangements: Alone Available Help at Discharge: Family;Available PRN/intermittently Type of Home: House Home Access: Stairs to enter Entrance Stairs-Rails: None Entrance Stairs-Number of Steps: 1 Home Layout: Laundry or work area in basement;Able to live on main level with bedroom/bathroom;Other (Comment)(Patient states she does not have to go into basement) Home Equipment: Walker - 4 wheels;Cane - single point;Shower seat      Prior Function Level of Independence: Independent with assistive device(s)  Comments: Household and short distanced community ambulator without AD most of time, occasionally has to use cane     Hand Dominance        Extremity/Trunk Assessment   Upper Extremity Assessment Upper Extremity Assessment: Overall WFL for tasks assessed    Lower Extremity Assessment Lower Extremity Assessment: Generalized weakness    Cervical / Trunk Assessment Cervical / Trunk Assessment: Normal  Communication   Communication: No difficulties  Cognition  Arousal/Alertness: Awake/alert Behavior During Therapy: WFL for tasks assessed/performed Overall Cognitive Status: Within Functional Limits for tasks assessed                                        General Comments General comments (skin integrity, edema, etc.): slightly unsteady due to corns on feet    Exercises     Assessment/Plan    PT Assessment All further PT needs can be met in the next venue of care  PT Problem List Decreased strength;Decreased activity tolerance;Decreased balance;Decreased mobility       PT Treatment Interventions      PT Goals (Current goals can be found in the Care Plan section)  Acute Rehab PT Goals Patient Stated Goal: return home with family to assist PT Goal Formulation: With patient Time For Goal Achievement: 08/18/19 Potential to Achieve Goals: Good    Frequency     Barriers to discharge        Co-evaluation               AM-PAC PT "6 Clicks" Mobility  Outcome Measure Help needed turning from your back to your side while in a flat bed without using bedrails?: None Help needed moving from lying on your back to sitting on the side of a flat bed without using bedrails?: None Help needed moving to and from a bed to a chair (including a wheelchair)?: None Help needed standing up from a chair using your arms (e.g., wheelchair or bedside chair)?: None Help needed to walk in hospital room?: A Little Help needed climbing 3-5 steps with a railing? : A Little 6 Click Score: 22    End of Session   Activity Tolerance: Patient tolerated treatment well Patient left: in chair;with call bell/phone within reach Nurse Communication: Mobility status PT Visit Diagnosis: Unsteadiness on feet (R26.81);Other abnormalities of gait and mobility (R26.89);Muscle weakness (generalized) (M62.81)    Time: 2957-4734 PT Time Calculation (min) (ACUTE ONLY): 28 min   Charges:   PT Evaluation $PT Eval Moderate Complexity: 1 Mod PT  Treatments $Therapeutic Activity: 23-37 mins        12:08 PM, 08/18/19 Lonell Grandchild, MPT Physical Therapist with [facility] 336 [phone] office [phone] mobile phone

## 2019-08-18 NOTE — Plan of Care (Signed)

## 2019-08-18 NOTE — Progress Notes (Signed)
AVS given to and discussed with patient. IVs and tele box removed. No questions or concerns.

## 2019-08-18 NOTE — Progress Notes (Signed)
Family member here. Patient discharged off unit with AVS and belongings in hand.

## 2019-08-18 NOTE — Care Management Important Message (Signed)
Important Message  Patient Details  Name: Cheryl Swanson MRN: [id number] Date of Birth: Nov 11, 1931   Medicare Important Message Given:  Yes     Corey Harold 08/18/2019, 2:42 PM

## 2019-08-18 NOTE — Discharge Summary (Signed)
Physician Discharge Summary  Cheryl Swanson AOZ:[id number] DOB: 16-Feb-1932 DOA: 08/16/2019  PCP: Lemmie Evens, MD  Admit date: 08/16/2019 Discharge date: 08/18/2019  Admitted From:  Home  Disposition: Home with Home Health   Recommendations for Outpatient Follow-up:  1. Home health RN to check PT/INR 4/2 and 4/5 and report results to Dr. Karie Kirks 2. DC lovenox injections when INR>2.  3. Follow up with Dr. Karie Kirks in 1 week 4. Bleeding precautions while on anticoagulation therapy  Home Health: RN, PT  Discharge Condition: STABLE   CODE STATUS: FULL    Brief Hospitalization Summary: Please see all hospital notes, images, labs for full details of the hospitalization.  ADMISSION HPI:  Cheryl Swanson  is a 86 y.o. female, with a history of rheumatoid arthritis who presents for chief complaint of fatigue.  Patient reports that she knew she had a blood clot because she was feeling fatigued.  She reports that this started 2 weeks ago and has been getting progressively worse.  She notices shortness of breath with long conversations, and exertion.  She denies chest pain and palpitations.  She denies unilateral swelling of her lower extremities.  Patient reports that she had a blood clot last fall.  She was on a blood thinner, Coumadin, and it was discontinued on January 1.  Patient reports that she was doing all right until 2 weeks ago.  She was in her normal state of health -and then the fatigue sent in.  Patient reports that she saw her primary care physician who then sent her to the ER for CT scan.  In the ED Temperature 98.6, pulse 110, respiratory rate 22, blood pressure 161/80, saturating 100%. Afebrile no leukocytosis Electrolytes reveal a hypokalemia at 3.4 Trop 7 INR 1.1 TSH from 3 weeks ago was 3.167 CTA chest shows small acute nonocclusive sub segmental pulmonary emboli in left upper lobe, right middle lobe, and both lower lobes.  Aortic atherosclerosis EKG showed sinus tachycardia  with a heart rate of 105 and QTC 420 Patient was started on full dose heparin drip.  Hospital Course:   Patient was admitted for multiple pulmonary emboli seen on CTA and started on an IV heparin infusion.  The patient had been off warfarin since January 2021.  Patient elected to restart warfarin as she had tolerated it well in the past and felt comfortable working with her PCP Dr. Karie Kirks who is managing her warfarin.  The patient was treated for hypokalemia.  On 2019-08-18 the patient was transitioned to subcutaneous Lovenox with the plan to discharge the patient home on Lovenox injections.  The patient started warfarin on 2019-08-17.  INR is 1.3 this morning.  The patient understands that she will take Lovenox injections while waiting for INR to be come therapeutic.  The patient was agreeable to having a home health RN come to the home to check her PT/INR and send the results to her primary care physician Dr. Karie Kirks.  The patient reports that Dr. Karie Kirks manage her warfarin dosing in the past.  The patient verbalizes understanding.  Home health RN instructions given and home health orders placed.  Bleeding precautions while on anticoagulant therapy reviewed with patient and written information provided.  The patient is stable to discharge home with home health today.  Close outpatient follow-up recommended.   Discharge Diagnoses:  Principal Problem:   Pulmonary embolism (HCC) Active Problems:   Fatigue   Rheumatoid arthritis [facility])   Discharge Instructions:  Allergies as of 08/18/2019   No Known Allergies  Medication List    STOP taking these medications   predniSONE 10 MG tablet Commonly known as: DELTASONE     TAKE these medications   albuterol 108 (90 Base) MCG/ACT inhaler Commonly known as: VENTOLIN HFA Inhale 2 puffs into the lungs every 4 (four) hours as needed for wheezing or shortness of breath.   calcium-vitamin D 500-200 MG-UNIT tablet Commonly known as: OSCAL  WITH D Take 1 tablet by mouth 2 (two) times daily.   Dry Eye Relief Drops 0.2-0.2-1 % Soln Generic drug: Glycerin-Hypromellose-PEG 400 Apply 1 drop to eye in the morning and at bedtime.   enoxaparin 60 MG/0.6ML injection Commonly known as: LOVENOX Inject 0.5 mLs (50 mg total) into the skin every 12 (twelve) hours for 7 days.   latanoprost 0.005 % ophthalmic solution Commonly known as: XALATAN Place 1 drop into both eyes at bedtime.   megestrol 40 MG/ML suspension Commonly known as: MEGACE Take 400 mg by mouth daily.   methotrexate 2.5 MG tablet Commonly known as: RHEUMATREX Take 9 tablets by mouth every Monday.   simvastatin 20 MG tablet Commonly known as: ZOCOR Take 20 mg by mouth every evening.   timolol 0.5 % ophthalmic solution Commonly known as: BETIMOL Place 1 drop into both eyes 2 (two) times daily.   traMADol 50 MG tablet Commonly known as: ULTRAM Take 50 mg by mouth 3 (three) times daily as needed for moderate pain or severe pain.   warfarin 2.5 MG tablet Commonly known as: COUMADIN Take 1 tablet (2.5 mg total) by mouth daily at 4 PM. Start taking on: August 19, 2019      Follow-up Information    [name], MD. Schedule an appointment as soon as possible for a visit in 1 week(s).   Specialty: Family Medicine Contact information: [address] [place] [address] [phone]          No Known Allergies Allergies as of 08/18/2019   No Known Allergies     Medication List    STOP taking these medications   predniSONE 10 MG tablet Commonly known as: DELTASONE     TAKE these medications   albuterol 108 (90 Base) MCG/ACT inhaler Commonly known as: VENTOLIN HFA Inhale 2 puffs into the lungs every 4 (four) hours as needed for wheezing or shortness of breath.   calcium-vitamin D 500-200 MG-UNIT tablet Commonly known as: OSCAL WITH D Take 1 tablet by mouth 2 (two) times daily.   Dry Eye Relief Drops 0.2-0.2-1 % Soln Generic drug:  Glycerin-Hypromellose-PEG 400 Apply 1 drop to eye in the morning and at bedtime.   enoxaparin 60 MG/0.6ML injection Commonly known as: LOVENOX Inject 0.5 mLs (50 mg total) into the skin every 12 (twelve) hours for 7 days.   latanoprost 0.005 % ophthalmic solution Commonly known as: XALATAN Place 1 drop into both eyes at bedtime.   megestrol 40 MG/ML suspension Commonly known as: MEGACE Take 400 mg by mouth daily.   methotrexate 2.5 MG tablet Commonly known as: RHEUMATREX Take 9 tablets by mouth every Monday.   simvastatin 20 MG tablet Commonly known as: ZOCOR Take 20 mg by mouth every evening.   timolol 0.5 % ophthalmic solution Commonly known as: BETIMOL Place 1 drop into both eyes 2 (two) times daily.   traMADol 50 MG tablet Commonly known as: ULTRAM Take 50 mg by mouth 3 (three) times daily as needed for moderate pain or severe pain.   warfarin 2.5 MG tablet Commonly known as: COUMADIN Take 1  tablet (2.5 mg total) by mouth daily at 4 PM. Start taking on: August 19, 2019       Procedures/Studies: CT ANGIO CHEST PE W OR WO CONTRAST  Result Date: 08/16/2019 CLINICAL DATA:  Shortness of breath for the past 2 weeks. EXAM: CT ANGIOGRAPHY CHEST WITH CONTRAST TECHNIQUE: Multidetector CT imaging of the chest was performed using the standard protocol during bolus administration of intravenous contrast. Multiplanar CT image reconstructions and MIPs were obtained to evaluate the vascular anatomy. CONTRAST:  80mL OMNIPAQUE IOHEXOL 350 MG/ML SOLN COMPARISON:  Chest x-ray dated June 10, 2018. CT chest dated April 14, 2012. FINDINGS: Cardiovascular: Small acute nonocclusive subsegmental pulmonary emboli in the left upper lobe, right middle lobe, and both lower lobes. Normal heart size. No pericardial effusion. No thoracic aortic aneurysm dissection. Minimal atherosclerotic calcification of the aortic arch. Mediastinum/Nodes: No enlarged mediastinal, hilar, or axillary lymph nodes.  Thyroid gland, trachea, and esophagus demonstrate no significant findings. Lungs/Pleura: No focal consolidation, pleural effusion, or pneumothorax. Unchanged 4 mm ground-glass nodule in the right upper lobe (series 6, image 60), stable since 2013, benign. No new pulmonary nodule. Unchanged calcified granuloma in the posterior right upper lobe. Chronic biapical pleuroparenchymal scarring, similar to prior study. Upper Abdomen: No acute abnormality. Small calcifications in the left adrenal gland again noted, likely related to prior hemorrhage. Musculoskeletal: No chest wall abnormality. No acute or significant osseous findings. Review of the MIP images confirms the above findings. IMPRESSION: 1. Small acute nonocclusive subsegmental pulmonary emboli in the left upper lobe, right middle lobe, and both lower lobes. 2.  Aortic atherosclerosis (ICD10-I70.0). Critical Value/emergent results were called by telephone at the time of interpretation on 08/16/2019 at 2:56 pm to provider [facility] , who verbally acknowledged these results. Electronically Signed   By: Obie Dredge M.D.   On: 08/16/2019 14:57   US Venous Img Lower Bilateral (DVT)  Result Date: 08/17/2019 CLINICAL DATA:  PE seen on CT. EXAM: BILATERAL LOWER EXTREMITY VENOUS DOPPLER ULTRASOUND TECHNIQUE: Gray-scale sonography with graded compression, as well as color Doppler and duplex ultrasound were performed to evaluate the lower extremity deep venous systems from the level of the common femoral vein and including the common femoral, femoral, profunda femoral, popliteal and calf veins including the posterior tibial, peroneal and gastrocnemius veins when visible. The superficial great saphenous vein was also interrogated. Spectral Doppler was utilized to evaluate flow at rest and with distal augmentation maneuvers in the common femoral, femoral and popliteal veins. COMPARISON:  None. FINDINGS: RIGHT LOWER EXTREMITY Common Femoral Vein: No evidence of  thrombus. Normal compressibility, respiratory phasicity and response to augmentation. Saphenofemoral Junction: No evidence of thrombus. Normal compressibility and flow on color Doppler imaging. Profunda Femoral Vein: No evidence of thrombus. Normal compressibility and flow on color Doppler imaging. Femoral Vein: No evidence of thrombus. Normal compressibility, respiratory phasicity and response to augmentation. Popliteal Vein: No evidence of thrombus. Normal compressibility, respiratory phasicity and response to augmentation. Calf Veins: The posterior tibial vein was unremarkable. The peroneal vein was not well visualized. Superficial Great Saphenous Vein: No evidence of thrombus. Normal compressibility. Venous Reflux:  None. Other Findings:  None. LEFT LOWER EXTREMITY Common Femoral Vein: No evidence of thrombus. Normal compressibility, respiratory phasicity and response to augmentation. Saphenofemoral Junction: No evidence of thrombus. Normal compressibility and flow on color Doppler imaging. Profunda Femoral Vein: No evidence of thrombus. Normal compressibility and flow on color Doppler imaging. Femoral Vein: No evidence of thrombus. Normal compressibility, respiratory phasicity and response to augmentation. Popliteal  Vein: No evidence of thrombus. Normal compressibility, respiratory phasicity and response to augmentation. Calf Veins: The posterior tibial vein was unremarkable. The peroneal vein was not well visualized. Superficial Great Saphenous Vein: No evidence of thrombus. Normal compressibility. Venous Reflux:  None. Other Findings:  None. IMPRESSION: No evidence of deep venous thrombosis in either lower extremity. Electronically Signed   By: Katherine Mantle M.D.   On: 08/17/2019 15:39   ECHOCARDIOGRAM COMPLETE  Result Date: 08/17/2019    ECHOCARDIOGRAM REPORT   Patient Name:   Cheryl Swanson Date of Exam: 08/17/2019 Medical Rec #:  [id number]      Height:       66.0 in Accession #:    [id number]      Weight:       116.8 lb Date of Birth:  26-Dec-1931       BSA:          1.591 m Patient Age:    86 years       BP:           133/75 mmHg Patient Gender: F              HR:           94 bpm. [facility] Procedure: 2D Echo, Cardiac Doppler and Color Doppler Indications:    Pulmonary embolus  History:        Patient has prior history of Echocardiogram examinations, most                 recent 06/18/2018. LE edema; Signs/Symptoms:Dyspnea.  Sonographer:    Lavenia Atlas RDCS Referring Phys: [id number] ASIA B ZIERLE-GHOSH IMPRESSIONS  1. Left ventricular ejection fraction, by estimation, is 60 to 65%. The left ventricle has normal function. The left ventricle has no regional wall motion abnormalities. Left ventricular diastolic parameters are consistent with Grade I diastolic dysfunction (impaired relaxation).  2. Right ventricular systolic function is normal. The right ventricular size is normal. There is normal pulmonary artery systolic pressure.  3. The mitral valve is degenerative. Mild mitral valve regurgitation.  4. The aortic valve is tricuspid. Aortic valve regurgitation is not visualized. Mild aortic valve sclerosis is present, with no evidence of aortic valve stenosis.  5. The inferior vena cava is normal in size with greater than 50% respiratory variability, suggesting right atrial pressure of 3 mmHg. FINDINGS  Left Ventricle: Left ventricular ejection fraction, by estimation, is 60 to 65%. The left ventricle has normal function. The left ventricle has no regional wall motion abnormalities. The left ventricular internal cavity size was normal in size. There is  no left ventricular hypertrophy. Left ventricular diastolic parameters are consistent with Grade I diastolic dysfunction (impaired relaxation). Indeterminate filling pressures. Right Ventricle: The right ventricular size is normal. No increase in right ventricular wall thickness. Right ventricular systolic function is normal. There is normal  pulmonary artery systolic pressure. The tricuspid regurgitant velocity is 2.50 m/s, and  with an assumed right atrial pressure of 3 mmHg, the estimated right ventricular systolic pressure is 28.0 mmHg. Left Atrium: Left atrial size was normal in size. Right Atrium: Right atrial size was normal in size. Pericardium: There is no evidence of pericardial effusion. Mitral Valve: The mitral valve is degenerative in appearance. There is mild calcification of the mitral valve leaflet(s). Mild mitral valve regurgitation. Tricuspid Valve: The tricuspid valve is grossly normal. Tricuspid valve regurgitation is trivial. Aortic Valve: The aortic valve is tricuspid. . There is mild thickening of the  aortic valve. Aortic valve regurgitation is not visualized. Mild aortic valve sclerosis is present, with no evidence of aortic valve stenosis. There is mild thickening of the aortic valve. Pulmonic Valve: The pulmonic valve was grossly normal. Pulmonic valve regurgitation is not visualized. Aorta: The aortic root is normal in size and structure. Venous: The inferior vena cava is normal in size with greater than 50% respiratory variability, suggesting right atrial pressure of 3 mmHg. IAS/Shunts: No atrial level shunt detected by color flow Doppler.  LEFT VENTRICLE PLAX 2D LVIDd:         2.93 cm  Diastology LVIDs:         2.18 cm  LV e' lateral:   6.74 cm/s LV PW:         0.93 cm  LV E/e' lateral: 13.7 LV IVS:        0.85 cm  LV e' medial:    7.29 cm/s LVOT diam:     2.00 cm  LV E/e' medial:  12.6 LV SV:         69 LV SV Index:   43 LVOT Area:     3.14 cm  RIGHT VENTRICLE RV Basal diam:  2.62 cm RV S prime:     18.30 cm/s LEFT ATRIUM             Index LA diam:        2.10 cm 1.32 cm/m LA Vol (A2C):   20.8 ml 13.07 ml/m LA Vol (A4C):   39.0 ml 24.51 ml/m LA Biplane Vol: 30.5 ml 19.17 ml/m  AORTIC VALVE LVOT Vmax:   104.00 cm/s LVOT Vmean:  64.200 cm/s LVOT VTI:    0.220 m  AORTA Ao Root diam: 2.60 cm MITRAL VALVE                 TRICUSPID VALVE MV Area (PHT): 3.33 cm     TR Peak grad:   25.0 mmHg MV Decel Time: 228 msec     TR Vmax:        250.00 cm/s MV E velocity: 92.20 cm/s MV A velocity: 129.00 cm/s  SHUNTS MV E/A ratio:  0.71         Systemic VTI:  0.22 m                             Systemic Diam: 2.00 cm Prentice Docker MD Electronically signed by Prentice Docker MD Signature Date/Time: 08/17/2019/2:26:50 PM    Final       Subjective: Pt says she feels well.  No bleeding complications.  Patient says she feels comfortable doing Lovenox injections at home feels that she can manage at home.  She is agreeable to having the home health nurse.  She denies chest pain or shortness of breath.  Discharge Exam: Vitals:   08/17/19 2106 08/18/19 0558  BP: 121/74 118/78  Pulse: 86 99  Resp: 18 16  Temp: 99.2 F (37.3 C) 98.8 F (37.1 C)  SpO2: 99% 100%   Vitals:   08/17/19 1003 08/17/19 1950 08/17/19 2106 08/18/19 0558  BP: 133/75  121/74 118/78  Pulse: 94  86 99  Resp: 18  18 16   Temp: 97.9 F (36.6 C)  99.2 F (37.3 C) 98.8 F (37.1 C)  TempSrc:   Oral Oral  SpO2: 99% 96% 99% 100%  Weight:      Height:       General: Pt is alert, awake, not in  acute distress Cardiovascular: RRR, S1/S2 +, no rubs, no gallops Respiratory: CTA bilaterally, no wheezing, no rhonchi Abdominal: Soft, NT, ND, bowel sounds + Extremities: no edema, no cyanosis   The results of significant diagnostics from this hospitalization (including imaging, microbiology, ancillary and laboratory) are listed below for reference.    Microbiology: Recent Results (from the past 240 hour(s))  SARS CORONAVIRUS 2 (TAT 6-24 HRS) Nasopharyngeal Nasopharyngeal Swab     Status: None   Collection Time: 08/16/19  6:23 PM   Specimen: Nasopharyngeal Swab  Result Value Ref Range Status   SARS Coronavirus 2 NEGATIVE NEGATIVE Final    Comment: (NOTE) SARS-CoV-2 target nucleic acids are NOT DETECTED. The SARS-CoV-2 RNA is generally detectable in upper  and lower respiratory specimens during the acute phase of infection. Negative results do not preclude SARS-CoV-2 infection, do not rule out co-infections with other pathogens, and should not be used as the sole basis for treatment or other patient management decisions. Negative results must be combined with clinical observations, patient history, and epidemiological information. The expected result is Negative. Fact Sheet for Patients: HairSlick.no Fact Sheet for Healthcare Providers: quierodirigir.com This test is not yet approved or cleared by the [place] FDA and  has been authorized for detection and/or diagnosis of SARS-CoV-2 by FDA under an Emergency Use Authorization (EUA). This EUA will remain  in effect (meaning this test can be used) for the duration of the COVID-19 declaration under Section 56 4(b)(1) of the Act, 21 U.S.C. section 360bbb-3(b)(1), unless the authorization is terminated or revoked sooner. Performed at [facility], [address] [place], [place] [address]      Labs: BNP (last 3 results) No results for input(s): BNP in the last 8760 hours. Basic Metabolic Panel: Recent Labs  Lab 08/16/19 1622 08/17/19 0543  NA 136 139  K 3.4* 4.0  CL 103 109  CO2 24 21*  GLUCOSE 103* 81  BUN 12 11  CREATININE 0.83 0.63  CALCIUM 9.9 9.5  MG  --  2.2   Liver Function Tests: Recent Labs  Lab 08/17/19 0543  AST 17  ALT 8  ALKPHOS 41  BILITOT 1.8*  PROT 6.5  ALBUMIN 3.1*   No results for input(s): LIPASE, AMYLASE in the last 168 hours. No results for input(s): AMMONIA in the last 168 hours. CBC: Recent Labs  Lab 08/16/19 1622 08/17/19 0543 08/18/19 0450  WBC 8.2 6.0 5.3  NEUTROABS 4.9 2.6  --   HGB 16.2* 15.1* 15.2*  HCT 48.8* 45.7 46.3*  MCV 94.9 95.2 95.3  PLT 337 317 327   Cardiac Enzymes: No results for input(s): CKTOTAL, CKMB, CKMBINDEX, TROPONINI in the last 168  hours. BNP: Invalid input(s): POCBNP CBG: No results for input(s): GLUCAP in the last 168 hours. D-Dimer No results for input(s): DDIMER in the last 72 hours. Hgb A1c No results for input(s): HGBA1C in the last 72 hours. Lipid Profile No results for input(s): CHOL, HDL, LDLCALC, TRIG, CHOLHDL, LDLDIRECT in the last 72 hours. Thyroid function studies No results for input(s): TSH, T4TOTAL, T3FREE, THYROIDAB in the last 72 hours.  Invalid input(s): FREET3 Anemia work up No results for input(s): VITAMINB12, FOLATE, FERRITIN, TIBC, IRON, RETICCTPCT in the last 72 hours. Urinalysis No results found for: COLORURINE, APPEARANCEUR, LABSPEC, PHURINE, GLUCOSEU, HGBUR, BILIRUBINUR, KETONESUR, PROTEINUR, UROBILINOGEN, NITRITE, LEUKOCYTESUR Sepsis Labs Invalid input(s): PROCALCITONIN,  WBC,  LACTICIDVEN Microbiology Recent Results (from the past 240 hour(s))  SARS CORONAVIRUS 2 (TAT 6-24 HRS) Nasopharyngeal Nasopharyngeal Swab  Status: None   Collection Time: 08/16/19  6:23 PM   Specimen: Nasopharyngeal Swab  Result Value Ref Range Status   SARS Coronavirus 2 NEGATIVE NEGATIVE Final    Comment: (NOTE) SARS-CoV-2 target nucleic acids are NOT DETECTED. The SARS-CoV-2 RNA is generally detectable in upper and lower respiratory specimens during the acute phase of infection. Negative results do not preclude SARS-CoV-2 infection, do not rule out co-infections with other pathogens, and should not be used as the sole basis for treatment or other patient management decisions. Negative results must be combined with clinical observations, patient history, and epidemiological information. The expected result is Negative. Fact Sheet for Patients: HairSlick.no Fact Sheet for Healthcare Providers: quierodirigir.com This test is not yet approved or cleared by the [place] FDA and  has been authorized for detection and/or diagnosis of SARS-CoV-2  by FDA under an Emergency Use Authorization (EUA). This EUA will remain  in effect (meaning this test can be used) for the duration of the COVID-19 declaration under Section 56 4(b)(1) of the Act, 21 U.S.C. section 360bbb-3(b)(1), unless the authorization is terminated or revoked sooner. Performed at [facility], [address], [place], [place] [address]    Time coordinating discharge:  33 minutes   SIGNED:  Standley Dakins, MD  [facility] 08/18/2019, 11:46 AM How to contact the [facility] Attending or Consulting provider 7A - 7P or covering provider during after hours 7P -7A, for this patient?  1. Check the care team in [facility] and look for a) attending/consulting [facility] provider listed and b) the [facility] team listed 2. Log into [email or web address] and use [place]'s universal password to access. If you do not have the password, please contact the hospital operator. 3. Locate the [facility] provider you are looking for under [facility] and page to a number that you can be directly reached. 4. If you still have difficulty reaching the provider, please page the [facility] (Director on Call) for the Hospitalists listed on amion for assistance.

## 2019-08-18 NOTE — Discharge Instructions (Signed)
Home health nurse to check PT/INR on 4/2 and 4/5 and report results to Dr. Karie Kirks.  STOP LOVENOX INJECTIONS WHEN INR>2   Bleeding Precautions When on Anticoagulant Therapy, Adult Anticoagulant therapy, also called blood thinner therapy, is medicine that helps to prevent and treat blood clots. The medicine works by stopping blood clots from forming or growing. Blood clots that form in your blood vessels can be dangerous. They can break loose and travel to the heart, lungs, or brain. This increases the risk of a heart attack, stroke, or blocked lung artery (pulmonary embolism). Anticoagulants also increase the risk of bleeding. Try to protect yourself from cuts and other injuries that can cause bleeding. It is important to take anticoagulants exactly as told by your health care provider. Why do I need to be on anticoagulant therapy? You may need this medicine if you are at risk of developing a blood clot. Conditions that increase your risk of a blood clot include:  Being born with heart disease or a heart malformation (congenital heart disease).  Developing heart disease.  Having had surgery, such as valve replacement.  Having had a serious accident or other type of severe injury (trauma).  Having certain types of cancer.  Having certain diseases that can increase blood clotting.  Having a high risk of stroke or heart attack.  Having atrial fibrillation (AF). What are the common anticoagulant medicines? There are several types of anticoagulant medicines. The most common types are:  Medicines that you take by mouth (oral medicines), such as: ? Warfarin. ? Novel oral anticoagulants (NOACs), such as:  Direct thrombin inhibitors (dabigatran).  Factor Xa inhibitors (apixaban, edoxaban, and rivaroxaban).  Injections, such as: ? Unfractionated heparin. ? Low molecular weight heparin. These anticoagulants work in different ways to prevent blood clots. They also have different risks and  side effects. What do I need to remember while on anticoagulant therapy? Taking anticoagulants  Take your medicine at the same time every day. If you forget to take your medicine, take it as soon as you remember. Do not double your dosage of medicine if you miss a whole day. Take your normal dose and call your health care provider.  Do not stop taking your medicine unless your health care provider approves. Stopping the medicine can increase your risk of developing a blood clot. Taking other medicines  Take over-the-counter and prescriptions medicines only as told by your health care provider.  Do not take over-the-counter NSAIDs, including aspirin and ibuprofen, while you are on anticoagulant therapy. These medicines increase your risk of dangerous bleeding.  Get approval from your health care provider before you start taking any new medicines, vitamins, or herbal products. Some of these could interfere with your therapy. General instructions  Keep all follow-up visits as told by your health care provider. This is important.  If you are pregnant or trying to get pregnant, talk with a health care provider about anticoagulants. Some of these medicines are not safe to take during pregnancy.  Tell all health care providers, including your dentist, that you are on anticoagulant therapy. It is especially important to tell providers before you have any surgery, medical procedures, or dental work done. What precautions should I take?   Be very careful when using knives, scissors, or other sharp objects.  Use an electric razor instead of a blade.  Do not use toothpicks.  Use a soft-bristled toothbrush. Brush your teeth gently.  Always wear shoes outdoors and wear slippers indoors.  Be careful when  cutting your fingernails and toenails.  Place bath mats in the bathroom. If possible, install handrails as well.  Wear gloves while you do yard work.  Wear your seat belt.  Prevent falls  by removing loose rugs and extension cords from areas where you walk. Use a cane or walker if you need it.  Avoid constipation by: ? Drinking enough fluid to keep your urine clear or pale yellow. ? Eating foods that are high in fiber, such as fresh fruits and vegetables, whole grains, and beans. ? Limiting foods that are high in fat and processed sugars, such as fried and sweet foods.  Do not play contact sports or participate in other activities that have a high risk for injury. What other precautions are important if on warfarin therapy? If you are taking a type of anticoagulant called warfarin, make sure you:  Work with a diet and nutrition specialist (dietitian) to make an eating plan. Do not make any sudden changes to your diet after you have started your eating plan.  Do not drink alcohol. It can interfere with your medicine and increase your risk of an injury that causes bleeding.  Get regular blood tests as told by your health care provider. What are some questions to ask my health care provider?  Why do I need anticoagulant therapy?  What is the best anticoagulant therapy for my condition?  How long will I need anticoagulant therapy?  What are the side effects of anticoagulant therapy?  When should I take my medicine? What should I do if I forget to take it?  Will I need to have regular blood tests?  Do I need to change my diet? Are there foods or drinks that I should avoid?  What activities are safe for me?  What should I do if I want to get pregnant? Contact a health care provider if:  You miss a dose of medicine: ? And you are not sure what to do. ? For more than one day.  You have: ? Menstrual bleeding that is heavier than normal. ? Bloody or brown urine. ? Easy bruising. ? Black and tarry stool or bright red stool. ? Side effects from your medicine.  You feel weak or dizzy.  You become pregnant. Get help right away if:  You have bleeding that will  not stop within 20 minutes from: ? The nose. ? The gums. ? A cut on the skin.  You have a severe headache or stomachache.  You vomit or cough up blood.  You fall or hit your head. Summary  Anticoagulant therapy, also called blood thinner therapy, is medicine that helps to prevent and treat blood clots.  Anticoagulants work in different ways to prevent blood clots. They also have different risks and side effects.  Talk with your health care provider about any precautions that you should take while on anticoagulant therapy. This information is not intended to replace advice given to you by your health care provider. Make sure you discuss any questions you have with your health care provider. Document Revised: 08/26/2018 Document Reviewed: 07/23/2016 Elsevier Patient Education  2020 [employer]   IMPORTANT INFORMATION: PAY CLOSE ATTENTION   PHYSICIAN DISCHARGE INSTRUCTIONS  Follow with Primary care provider  [name], MD  and other consultants as instructed by your Hospitalist Physician  SEEK MEDICAL CARE OR RETURN TO EMERGENCY ROOM IF SYMPTOMS COME BACK, WORSEN OR NEW PROBLEM DEVELOPS   Please note: You were cared for by a hospitalist during your hospital  stay. Every effort will be made to forward records to your primary care provider.  You can request that your primary care provider send for your hospital records if they have not received them.  Once you are discharged, your primary care physician will handle any further medical issues. Please note that NO REFILLS for any discharge medications will be authorized once you are discharged, as it is imperative that you return to your primary care physician (or establish a relationship with a primary care physician if you do not have one) for your post hospital discharge needs so that they can reassess your need for medications and monitor your lab values.  Please get a complete blood count and chemistry panel checked by your  Primary MD at your next visit, and again as instructed by your Primary MD.  Get Medicines reviewed and adjusted: Please take all your medications with you for your next visit with your Primary MD  Laboratory/radiological data: Please request your Primary MD to go over all hospital tests and procedure/radiological results at the follow up, please ask your primary care provider to get all Hospital records sent to his/her office.  In some cases, they will be blood work, cultures and biopsy results pending at the time of your discharge. Please request that your primary care provider follow up on these results.  If you are diabetic, please bring your blood sugar readings with you to your follow up appointment with primary care.    Please call and make your follow up appointments as soon as possible.    Also Note the following: If you experience worsening of your admission symptoms, develop shortness of breath, life threatening emergency, suicidal or homicidal thoughts you must seek medical attention immediately by calling 911 or calling your MD immediately  if symptoms less severe.  You must read complete instructions/literature along with all the possible adverse reactions/side effects for all the Medicines you take and that have been prescribed to you. Take any new Medicines after you have completely understood and accpet all the possible adverse reactions/side effects.   Do not drive when taking Pain medications or sleeping medications (Benzodiazepines)  Do not take more than prescribed Pain, Sleep and Anxiety Medications. It is not advisable to combine anxiety,sleep and pain medications without talking with your primary care practitioner  Special Instructions: If you have smoked or chewed Tobacco  in the last 2 yrs please stop smoking, stop any regular Alcohol  and or any Recreational drug use.  Wear Seat belts while driving.  Do not drive if taking any narcotic, mind altering or controlled  substances or recreational drugs or alcohol.

## 2019-08-18 NOTE — TOC Transition Note (Signed)
Transition of Care [facility]) - CM/SW Discharge Note   Patient Details  Name: Cheryl Swanson MRN: [id number] Date of Birth: [birth year]  [facility]) CM/SW Contact:  Shade Flood, LCSW Phone Number: 08/18/2019, 12:37 PM   Clinical Narrative:     Pt from home. She is stable for dc home today per MD. MD has ordered [facility] RN and PT. Discussed with pt who is agreeable. Reviewed [place] agency options and referred as requested. There are no other TOC needs identified for dc.  Expected Discharge Plan: [place] Barriers to Discharge: Barriers Resolved   Patient Goals and CMS Choice Patient states their goals for this hospitalization and ongoing recovery are:: return home CMS Medicare.gov Compare Post Acute Care list provided to:: Patient Choice offered to / list presented to : Patient  Expected Discharge Plan and Services Expected Discharge Plan: [place] In-house Referral: Clinical Social Work   Post Acute Care Choice: [place] arrangements for the past 2 months: [place] Expected Discharge Date: 08/18/19                         HH Arranged: RN, PT [place] Agency: [place] ([place]) Date [place]: 08/18/19   Representative spoke with at [place]: Vaughan Basta  Prior Living Arrangements/Services Living arrangements for the past 2 months: [place] with:: Self Patient language and need for interpreter reviewed:: Yes Do you feel safe going back to the place where you live?: Yes      Need for Family Participation in Patient Care: No (Comment) Care giver support system in place?: Yes (comment)   Criminal Activity/Legal Involvement Pertinent to Current Situation/Hospitalization: No - Comment as needed  Activities of Daily Living Home Assistive Devices/Equipment: Cane (specify quad or straight), Dentures (specify type), Eyeglasses, Walker (specify type) ADL Screening (condition at time of  admission) Patient's cognitive ability adequate to safely complete daily activities?: Yes Is the patient deaf or have difficulty hearing?: No Does the patient have difficulty seeing, even when wearing glasses/contacts?: No Does the patient have difficulty concentrating, remembering, or making decisions?: No Patient able to express need for assistance with ADLs?: Yes Does the patient have difficulty dressing or bathing?: No Independently performs ADLs?: Yes (appropriate for developmental age) Does the patient have difficulty walking or climbing stairs?: No Weakness of Legs: None Weakness of Arms/Hands: None  Permission Sought/Granted Permission sought to share information with : Chartered certified accountant granted to share information with : Yes, Verbal Permission Granted     Permission granted to share info w AGENCY: [facility]        Emotional Assessment   Attitude/Demeanor/Rapport: Engaged Affect (typically observed): Pleasant Orientation: : Oriented to Self, Oriented to Place, Oriented to  Time, Oriented to Situation Alcohol / Substance Use: Not Applicable Psych Involvement: No (comment)  Admission diagnosis:  Pulmonary embolism (HCC) [I26.99] Multiple subsegmental pulmonary emboli without acute cor pulmonale (HCC) [I26.94] Patient Active Problem List   Diagnosis Date Noted  . Pulmonary embolism ([place]) 08/16/2019  . Fatigue 10/29/2012  . Rheumatoid arthritis ([place]) 10/29/2012   PCP:  Lemmie Evens, MD Pharmacy:   [facility] [place] [address] Phone: [phone] Fax: [phone]     Social Determinants of Health (SDOH) Interventions    Readmission Risk Interventions Readmission Risk Prevention Plan 08/18/2019  Medication Screening Complete  Transportation Screening Complete  Some recent data might be hidden    Final next level of care: Home w Home Health Services Barriers to Discharge:  Barriers Resolved   Patient Goals and CMS Choice Patient states their goals for this hospitalization and ongoing recovery are:: return home CMS Medicare.gov Compare Post Acute Care list provided to:: Patient Choice offered to / list presented to : Patient  Discharge Placement                       Discharge Plan and Services In-house Referral: Clinical Social Work   Post Acute Care Choice: Home Health                    HH Arranged: RN, PT [facility] Agency: [facility] Date [facility] Agency Contacted: 08/18/19   Representative spoke with at [facility] Agency: [place]ita Quin  Social Determinants of Health (SDOH) Interventions     Readmission Risk Interventions Readmission Risk Prevention Plan 08/18/2019  Medication Screening Complete  Transportation Screening Complete  Some recent data might be hidden

## 2019-08-18 NOTE — Progress Notes (Signed)
ANTICOAGULATION CONSULT NOTE   Pharmacy Consult for lovenox and warfarin  Indication: pulmonary embolus  No Known Allergies  Patient Measurements: Height: 5\' 6"  (167.6 cm) Weight: 116 lb 13.5 oz (53 kg) IBW/kg (Calculated) : 59.3 Heparin Dosing Weight: HEPARIN DW (KG): 53   Vital Signs: Temp: 98.8 F (37.1 C) (03/31 0558) Temp Source: Oral (03/31 0558) BP: 118/78 (03/31 0558) Pulse Rate: 99 (03/31 0558)  Labs: Recent Labs    08/16/19 1622 08/17/19 0010 08/17/19 0543 08/17/19 1030 08/17/19 2001 08/18/19 0450  HGB 16.2*  --  15.1*  --   --  15.2*  HCT 48.8*  --  45.7  --   --  46.3*  PLT 337  --  317  --   --  327  APTT 29  --   --   --   --   --   LABPROT 14.0  --   --   --   --  15.6*  INR 1.1  --   --   --   --  1.3*  HEPARINUNFRC  --    < >  --  0.80* 0.58 0.33  CREATININE 0.83  --  0.63  --   --   --   TROPONINIHS 7  --   --   --   --   --    < > = values in this interval not displayed.    Estimated Creatinine Clearance: 42.2 mL/min (by C-G formula based on SCr of 0.63 mg/dL).   Medical History: Past Medical History:  Diagnosis Date  . Glaucoma   . Mitral valve disorder   . Peptic ulcer   . Rheumatoid arthritis(714.0)   . Tricuspid valve disorder     Assessment: Pharmacy consulted to dose heparin infusion for this 86 yo female sent to the ED by her PCP's office  for multiple blood clots in lungs. Patient stopped taking warfarin in January for unknown reason.    Goal of Therapy:  INR 2-3  Monitor platelets by anticoagulation protocol: Yes   Plan:  Stop heparin infusion. Start lovenox 50 mg subq q12 hours Warfarin 5 mg x 1 dose today. Patient to then take 2.5 mg daily on discharge until INR check on 4/2. Monitor labs and s/s of bleeding.  6/2, PharmD Clinical Pharmacist 08/18/2019 10:57 AM

## 2019-09-02 ENCOUNTER — Other Ambulatory Visit: Payer: Self-pay

## 2019-09-02 ENCOUNTER — Inpatient Hospital Stay (HOSPITAL_COMMUNITY): Payer: Medicare Other

## 2019-09-02 ENCOUNTER — Encounter (HOSPITAL_COMMUNITY): Payer: Self-pay | Admitting: Hematology

## 2019-09-02 ENCOUNTER — Inpatient Hospital Stay (HOSPITAL_COMMUNITY): Payer: Medicare Other | Attending: Hematology | Admitting: Hematology

## 2019-09-02 VITALS — BP 141/71 | HR 87 | Temp 96.9°F | Resp 18 | Ht 67.0 in | Wt 117.0 lb

## 2019-09-02 DIAGNOSIS — I079 Rheumatic tricuspid valve disease, unspecified: Secondary | ICD-10-CM | POA: Diagnosis not present

## 2019-09-02 DIAGNOSIS — Z86718 Personal history of other venous thrombosis and embolism: Secondary | ICD-10-CM | POA: Insufficient documentation

## 2019-09-02 DIAGNOSIS — Z803 Family history of malignant neoplasm of breast: Secondary | ICD-10-CM | POA: Diagnosis not present

## 2019-09-02 DIAGNOSIS — I059 Rheumatic mitral valve disease, unspecified: Secondary | ICD-10-CM | POA: Diagnosis not present

## 2019-09-02 DIAGNOSIS — Z79899 Other long term (current) drug therapy: Secondary | ICD-10-CM | POA: Insufficient documentation

## 2019-09-02 DIAGNOSIS — Z8042 Family history of malignant neoplasm of prostate: Secondary | ICD-10-CM | POA: Diagnosis not present

## 2019-09-02 DIAGNOSIS — Z800 Family history of malignant neoplasm of digestive organs: Secondary | ICD-10-CM | POA: Insufficient documentation

## 2019-09-02 DIAGNOSIS — Z90710 Acquired absence of both cervix and uterus: Secondary | ICD-10-CM | POA: Diagnosis not present

## 2019-09-02 DIAGNOSIS — Z809 Family history of malignant neoplasm, unspecified: Secondary | ICD-10-CM | POA: Diagnosis not present

## 2019-09-02 DIAGNOSIS — Z7901 Long term (current) use of anticoagulants: Secondary | ICD-10-CM | POA: Insufficient documentation

## 2019-09-02 DIAGNOSIS — Z86711 Personal history of pulmonary embolism: Secondary | ICD-10-CM | POA: Insufficient documentation

## 2019-09-02 DIAGNOSIS — I2694 Multiple subsegmental pulmonary emboli without acute cor pulmonale: Secondary | ICD-10-CM

## 2019-09-02 DIAGNOSIS — M069 Rheumatoid arthritis, unspecified: Secondary | ICD-10-CM | POA: Diagnosis not present

## 2019-09-02 LAB — PROTIME-INR
INR: 2 — ABNORMAL HIGH (ref 0.8–1.2)
Prothrombin Time: 22.4 seconds — ABNORMAL HIGH (ref 11.4–15.2)

## 2019-09-02 NOTE — Progress Notes (Signed)
CONSULT NOTE  Patient Care Team: Lemmie Evens, MD as PCP - General (Family Medicine)  CHIEF COMPLAINTS/PURPOSE OF CONSULTATION: Recurrent unprovoked blood clot  HISTORY OF PRESENTING ILLNESS:  Cheryl Swanson 86 y.o. female was sent here by her PCP for recurrent unprovoked blood clot.  Patient had a DVT in her left lower extremity fall 2020 where she was placed on Coumadin.  Patient took Coumadin until May 21, 2019.  Patient reports in March she started having increased fatigue and shortness of breath.  Where she was sent to the ER and was found to have a pulmonary embolism.  Patient was then placed on Lovenox and bridged to Coumadin.  She is being managed by her PCP at this time.  She denies a personal history of blood clots prior than these 2 clots.  She denies a family history of blood clots.  She denies any alcohol, smoking or any illicit drugs.  She reports over the years she is become more sedentary.  However she does perform all her own ADLs on a day-to-day basis.  She has her daughter to help her perform some of these.  She denies any long trips or any provoking circumstances for these clots.  She denies a history of miscarriages.  She had a hysterectomy at the age of 22.  She never took any estrogen replacement or hormones.  She denies recent chest pain on exertion, shortness of breath on minimal exertion, presyncopal episodes or palpitations.  She has not noticed any recent bleeding such as epistasis, hematuria or hematochezia.  Patient denies any over-the-counter NSAID ingestion.  She reports she is still taking her Coumadin as prescribed.  She has no prior history or diagnosis of cancer.  She has a positive family history of her mother with colon cancer.  Her father had an unknown type of cancer.  Sister had breast cancer and son had prostate cancer.   MEDICAL HISTORY:  Past Medical History:  Diagnosis Date  . Glaucoma   . Mitral valve disorder   . Peptic ulcer   . Rheumatoid  arthritis(714.0)   . Tricuspid valve disorder     SURGICAL HISTORY: Past Surgical History:  Procedure Laterality Date  . ABDOMINAL HYSTERECTOMY  1971  . HAND SURGERY  1980's   left    SOCIAL HISTORY: Social History   Socioeconomic History  . Marital status: Widowed    Spouse name: Not on file  . Number of children: Not on file  . Years of education: Not on file  . Highest education level: Not on file  Occupational History  . Not on file  Tobacco Use  . Smoking status: Never Smoker  . Smokeless tobacco: Never Used  Substance and Sexual Activity  . Alcohol use: No  . Drug use: No  . Sexual activity: Not on file  Other Topics Concern  . Not on file  Social History Narrative  . Not on file   Social Determinants of Health   Financial Resource Strain:   . Difficulty of Paying Living Expenses:   Food Insecurity:   . Worried About Charity fundraiser in the Last Year:   . Arboriculturist in the Last Year:   Transportation Needs:   . Film/video editor (Medical):   Marland Kitchen Lack of Transportation (Non-Medical):   Physical Activity:   . Days of Exercise per Week:   . Minutes of Exercise per Session:   Stress:   . Feeling of Stress :   Social  Connections:   . Frequency of Communication with Friends and Family:   . Frequency of Social Gatherings with Friends and Family:   . Attends Religious Services:   . Active Member of Clubs or Organizations:   . Attends Banker Meetings:   Marland Kitchen Marital Status:   Intimate Partner Violence:   . Fear of Current or Ex-Partner:   . Emotionally Abused:   Marland Kitchen Physically Abused:   . Sexually Abused:     FAMILY HISTORY: Family History  Problem Relation Age of Onset  . Cancer Mother   . Cancer Father   . Cancer Brother   . Cancer Brother   . Cancer Son        prostate-remission    ALLERGIES:  has No Known Allergies.  MEDICATIONS:  Current Outpatient Medications  Medication Sig Dispense Refill  . albuterol (PROVENTIL  HFA;VENTOLIN HFA) 108 (90 Base) MCG/ACT inhaler Inhale 2 puffs into the lungs every 4 (four) hours as needed for wheezing or shortness of breath. 1 Inhaler 3  . calcium-vitamin D (OSCAL WITH D) 500-200 MG-UNIT per tablet Take 1 tablet by mouth 2 (two) times daily.    . Glycerin-Hypromellose-PEG 400 (DRY EYE RELIEF DROPS) 0.2-0.2-1 % SOLN Apply 1 drop to eye in the morning and at bedtime.    Marland Kitchen latanoprost (XALATAN) 0.005 % ophthalmic solution Place 1 drop into both eyes at bedtime.     . megestrol (MEGACE) 40 MG/ML suspension Take 400 mg by mouth daily.    . methotrexate (RHEUMATREX) 2.5 MG tablet Take 9 tablets by mouth every Monday.     . simvastatin (ZOCOR) 20 MG tablet Take 20 mg by mouth every evening.     . timolol (BETIMOL) 0.5 % ophthalmic solution Place 1 drop into both eyes 2 (two) times daily.    . traMADol (ULTRAM) 50 MG tablet Take 50 mg by mouth 3 (three) times daily as needed for moderate pain or severe pain.     Marland Kitchen warfarin (COUMADIN) 2.5 MG tablet Take 1 tablet (2.5 mg total) by mouth daily at 4 PM. 30 tablet 0  . enoxaparin (LOVENOX) 60 MG/0.6ML injection Inject 0.5 mLs (50 mg total) into the skin every 12 (twelve) hours for 7 days. 7 mL 0  . timolol (TIMOPTIC) 0.5 % ophthalmic solution 1 drop every morning.     No current facility-administered medications for this visit.    REVIEW OF SYSTEMS:   Constitutional: Denies fevers, chills or abnormal night sweats Respiratory: Denies cough, dyspnea or wheezes Cardiovascular: Denies palpitation, chest discomfort or lower extremity swelling Gastrointestinal:  Denies nausea, heartburn or change in bowel habits.  Positive for chronic constipation. Skin: Denies abnormal skin rashes Lymphatics: Denies new lymphadenopathy or easy bruising Neurological:Denies numbness, tingling or new weaknesses Behavioral/Psych: Mood is stable, no new changes  All other systems were reviewed with the patient and are negative.  PHYSICAL EXAMINATION: ECOG  PERFORMANCE STATUS: 1 - Symptomatic but completely ambulatory  Vitals:   09/02/19 1428  BP: (!) 141/71  Pulse: 87  Resp: 18  Temp: (!) 96.9 F (36.1 C)  SpO2: 99%   Filed Weights   09/02/19 1428  Weight: 117 lb (53.1 kg)    GENERAL:alert, no distress and comfortable SKIN: skin color, texture, turgor are normal, no rashes or significant lesions NECK: supple, thyroid normal size, non-tender, without nodularity LYMPH:  no palpable lymphadenopathy in the cervical, axillary or inguinal LUNGS: clear to auscultation and percussion with normal breathing effort HEART: regular rate & rhythm and  no murmurs and no lower extremity edema ABDOMEN:abdomen soft, non-tender and normal bowel sounds Musculoskeletal:no cyanosis of digits and no clubbing.  Joint deformities in the upper extremity consistent with rheumatoid arthritis. PSYCH: alert & oriented x 3 with fluent speech NEURO: no focal motor/sensory deficits  LABORATORY DATA:  I have reviewed the data as listed Recent Results (from the past 2160 hour(s))  Novel Coronavirus, NAA ([facility])     Status: None   Collection Time: 06/10/19 10:54 AM   Specimen: Nasopharyngeal(NP) swabs in vial transport medium   NASOPHARYNGE  TESTING  Result Value Ref Range   SARS-CoV-2, NAA Not Detected Not Detected    Comment: This nucleic acid amplification test was developed and its performance characteristics determined by [employer]. Nucleic acid amplification tests include RT-PCR and TMA. This test has not been FDA cleared or approved. This test has been authorized by FDA under an Emergency Use Authorization (EUA). This test is only authorized for the duration of time the declaration that circumstances exist justifying the authorization of the emergency use of in vitro diagnostic tests for detection of SARS-CoV-2 virus and/or diagnosis of COVID-19 infection under section 564(b)(1) of the Act, 21 U.S.C. 161WRU-0(A) (1), unless the  authorization is terminated or revoked sooner. When diagnostic testing is negative, the possibility of a false negative result should be considered in the context of a patient's recent exposures and the presence of clinical signs and symptoms consistent with COVID-19. An individual without symptoms of COVID-19 and who is not shedding SARS-CoV-2 virus wo uld expect to have a negative (not detected) result in this assay.   TSH     Status: None   Collection Time: 07/19/19  1:05 PM  Result Value Ref Range   TSH 3.167 0.350 - 4.500 uIU/mL    Comment: Performed by a 3rd Generation assay with a functional sensitivity of <=0.01 uIU/mL. Performed at [facility], [address] [place], [place] [address]   Lipid panel     Status: None   Collection Time: 07/19/19  1:05 PM  Result Value Ref Range   Cholesterol 159 0 - 200 mg/dL   Triglycerides 41 <119 mg/dL   HDL 58 >14 mg/dL   Total CHOL/HDL Ratio 2.7 RATIO   VLDL 8 0 - 40 mg/dL   LDL Cholesterol 93 0 - 99 mg/dL    Comment:        Total Cholesterol/HDL:CHD Risk Coronary Heart Disease Risk Table                     Men   Women  1/2 Average Risk   3.4   3.3  Average Risk       5.0   4.4  2 X Average Risk   9.6   7.1  3 X Average Risk  23.4   11.0        Use the calculated Patient Ratio above and the CHD Risk Table to determine the patient's CHD Risk.        ATP III CLASSIFICATION (LDL):  <100     mg/dL   Optimal  782-956  mg/dL   Near or Above                    Optimal  130-159  mg/dL   Borderline  213-086  mg/dL   High  >578     mg/dL   Very High Performed at [facility], [address] [place], [place] [address]   Comprehensive metabolic  panel     Status: Abnormal   Collection Time: 07/19/19  1:05 PM  Result Value Ref Range   Sodium 140 135 - 145 mmol/L   Potassium 3.7 3.5 - 5.1 mmol/L   Chloride 106 98 - 111 mmol/L   CO2 27 22 - 32 mmol/L   Glucose, Bld 90 70 - 99 mg/dL    Comment: Glucose reference range applies only  to samples taken after fasting for at least 8 hours.   BUN 13 8 - 23 mg/dL   Creatinine, Ser 8.03 0.44 - 1.00 mg/dL   Calcium 9.7 8.9 - 21.2 mg/dL   Total Protein 7.4 6.5 - 8.1 g/dL   Albumin 3.6 3.5 - 5.0 g/dL   AST 14 (L) 15 - 41 U/L   ALT 8 0 - 44 U/L   Alkaline Phosphatase 42 38 - 126 U/L   Total Bilirubin 1.1 0.3 - 1.2 mg/dL   GFR calc non Af Amer >60 >60 mL/min   GFR calc Af Amer >60 >60 mL/min   Anion gap 7 5 - 15    Comment: Performed at [facility], [address], [place], [place] [address]  CBC     Status: None   Collection Time: 07/19/19  1:05 PM  Result Value Ref Range   WBC 6.1 4.0 - 10.5 K/uL   RBC 4.58 3.87 - 5.11 MIL/uL   Hemoglobin 14.5 12.0 - 15.0 g/dL   HCT 00.3 70.4 - 88.8 %   MCV 97.6 80.0 - 100.0 fL   MCH 31.7 26.0 - 34.0 pg   MCHC 32.4 30.0 - 36.0 g/dL   RDW 91.6 94.5 - 03.8 %   Platelets 266 150 - 400 K/uL   nRBC 0.0 0.0 - 0.2 %    Comment: Performed at [facility], [address] [place], [place] [address]  CBC with Differential/Platelet     Status: Abnormal   Collection Time: 08/16/19  4:22 PM  Result Value Ref Range   WBC 8.2 4.0 - 10.5 K/uL   RBC 5.14 (H) 3.87 - 5.11 MIL/uL   Hemoglobin 16.2 (H) 12.0 - 15.0 g/dL   HCT 03.4 (H) 91.7 - 91.5 %   MCV 94.9 80.0 - 100.0 fL   MCH 31.5 26.0 - 34.0 pg   MCHC 33.2 30.0 - 36.0 g/dL   RDW 05.6 97.9 - 48.0 %   Platelets 337 150 - 400 K/uL   nRBC 0.0 0.0 - 0.2 %   Neutrophils Relative % 59 %   Neutro Abs 4.9 1.7 - 7.7 K/uL   Lymphocytes Relative 23 %   Lymphs Abs 1.9 0.7 - 4.0 K/uL   Monocytes Relative 14 %   Monocytes Absolute 1.1 (H) 0.1 - 1.0 K/uL   Eosinophils Relative 2 %   Eosinophils Absolute 0.1 0.0 - 0.5 K/uL   Basophils Relative 1 %   Basophils Absolute 0.1 0.0 - 0.1 K/uL   Immature Granulocytes 1 %   Abs Immature Granulocytes 0.06 0.00 - 0.07 K/uL    Comment: Performed at [facility], [address], [place], [place] [address]  Basic metabolic panel     Status: Abnormal   Collection  Time: 08/16/19  4:22 PM  Result Value Ref Range   Sodium 136 135 - 145 mmol/L   Potassium 3.4 (L) 3.5 - 5.1 mmol/L   Chloride 103 98 - 111 mmol/L   CO2 24 22 - 32 mmol/L   Glucose, Bld 103 (H) 70 - 99 mg/dL  Comment: Glucose reference range applies only to samples taken after fasting for at least 8 hours.   BUN 12 8 - 23 mg/dL   Creatinine, Ser 1.61 0.44 - 1.00 mg/dL   Calcium 9.9 8.9 - 09.6 mg/dL   GFR calc non Af Amer >60 >60 mL/min   GFR calc Af Amer >60 >60 mL/min   Anion gap 9 5 - 15    Comment: Performed at [facility], [address] [place], [place] [address]  Troponin I (High Sensitivity)     Status: None   Collection Time: 08/16/19  4:22 PM  Result Value Ref Range   Troponin I (High Sensitivity) 7 <18 ng/L    Comment: (NOTE) Elevated high sensitivity troponin I (hsTnI) values and significant  changes across serial measurements may suggest ACS but many other  chronic and acute conditions are known to elevate hsTnI results.  Refer to the "Links" section for chest pain algorithms and additional  guidance. Performed at [facility], [address] [place], [place] [address]   APTT     Status: None   Collection Time: 08/16/19  4:22 PM  Result Value Ref Range   aPTT 29 24 - 36 seconds    Comment: Performed at [facility], [address] [place], [place] [address]  Protime-INR     Status: None   Collection Time: 08/16/19  4:22 PM  Result Value Ref Range   Prothrombin Time 14.0 11.4 - 15.2 seconds   INR 1.1 0.8 - 1.2    Comment: (NOTE) INR goal varies based on device and disease states. Performed at [facility], [address], [place], [place] [address]   SARS CORONAVIRUS 2 (TAT 6-24 HRS) Nasopharyngeal Nasopharyngeal Swab     Status: None   Collection Time: 08/16/19  6:23 PM   Specimen: Nasopharyngeal Swab  Result Value Ref Range   SARS Coronavirus 2 NEGATIVE NEGATIVE    Comment: (NOTE) SARS-CoV-2 target nucleic acids are NOT DETECTED. The SARS-CoV-2 RNA is  generally detectable in upper and lower respiratory specimens during the acute phase of infection. Negative results do not preclude SARS-CoV-2 infection, do not rule out co-infections with other pathogens, and should not be used as the sole basis for treatment or other patient management decisions. Negative results must be combined with clinical observations, patient history, and epidemiological information. The expected result is Negative. Fact Sheet for Patients: HairSlick.no Fact Sheet for Healthcare Providers: quierodirigir.com This test is not yet approved or cleared by the [place] FDA and  has been authorized for detection and/or diagnosis of SARS-CoV-2 by FDA under an Emergency Use Authorization (EUA). This EUA will remain  in effect (meaning this test can be used) for the duration of the COVID-19 declaration under Section 56 4(b)(1) of the Act, 21 U.S.C. section 360bbb-3(b)(1), unless the authorization is terminated or revoked sooner. Performed at [facility], [address] [place], [place] [address]   Heparin level (unfractionated)     Status: Abnormal   Collection Time: 08/17/19 12:10 AM  Result Value Ref Range   Heparin Unfractionated 0.88 (H) 0.30 - 0.70 IU/mL    Comment: (NOTE) If heparin results are below expected values, and patient dosage has  been confirmed, suggest follow up testing of antithrombin III levels. Performed at [facility] [address], [place], [place] [address]   Comprehensive metabolic panel     Status: Abnormal   Collection Time: 08/17/19  5:43 AM  Result Value Ref Range   Sodium 139  135 - 145 mmol/L   Potassium 4.0 3.5 - 5.1 mmol/L   Chloride 109 98 - 111 mmol/L   CO2 21 (L) 22 - 32 mmol/L   Glucose, Bld 81 70 - 99 mg/dL    Comment: Glucose reference range applies only to samples taken after fasting for at least 8 hours.   BUN 11 8 - 23 mg/dL   Creatinine, Ser 1.60 0.44 -  1.00 mg/dL   Calcium 9.5 8.9 - 73.7 mg/dL   Total Protein 6.5 6.5 - 8.1 g/dL   Albumin 3.1 (L) 3.5 - 5.0 g/dL   AST 17 15 - 41 U/L   ALT 8 0 - 44 U/L   Alkaline Phosphatase 41 38 - 126 U/L   Total Bilirubin 1.8 (H) 0.3 - 1.2 mg/dL   GFR calc non Af Amer >60 >60 mL/min   GFR calc Af Amer >60 >60 mL/min   Anion gap 9 5 - 15    Comment: Performed at [facility], [address], [place], [place] [address]  Magnesium     Status: None   Collection Time: 08/17/19  5:43 AM  Result Value Ref Range   Magnesium 2.2 1.7 - 2.4 mg/dL    Comment: Performed at [facility], [address] [place], [place] [address]  CBC WITH DIFFERENTIAL     Status: Abnormal   Collection Time: 08/17/19  5:43 AM  Result Value Ref Range   WBC 6.0 4.0 - 10.5 K/uL   RBC 4.80 3.87 - 5.11 MIL/uL   Hemoglobin 15.1 (H) 12.0 - 15.0 g/dL   HCT 62.7 03.5 - 00.9 %   MCV 95.2 80.0 - 100.0 fL   MCH 31.5 26.0 - 34.0 pg   MCHC 33.0 30.0 - 36.0 g/dL   RDW 38.1 82.9 - 93.7 %   Platelets 317 150 - 400 K/uL   nRBC 0.0 0.0 - 0.2 %   Neutrophils Relative % 43 %   Neutro Abs 2.6 1.7 - 7.7 K/uL   Lymphocytes Relative 35 %   Lymphs Abs 2.1 0.7 - 4.0 K/uL   Monocytes Relative 17 %   Monocytes Absolute 1.0 0.1 - 1.0 K/uL   Eosinophils Relative 3 %   Eosinophils Absolute 0.2 0.0 - 0.5 K/uL   Basophils Relative 1 %   Basophils Absolute 0.1 0.0 - 0.1 K/uL   Immature Granulocytes 1 %   Abs Immature Granulocytes 0.03 0.00 - 0.07 K/uL    Comment: Performed at [facility], [address] [place], [place] [address]  Heparin level (unfractionated)     Status: Abnormal   Collection Time: 08/17/19 10:30 AM  Result Value Ref Range   Heparin Unfractionated 0.80 (H) 0.30 - 0.70 IU/mL    Comment: (NOTE) If heparin results are below expected values, and patient dosage has  been confirmed, suggest follow up testing of antithrombin III levels. Performed at [facility], [address] [place], [place] [address]   ECHOCARDIOGRAM COMPLETE      Status: None   Collection Time: 08/17/19  1:49 PM  Result Value Ref Range   Weight 1,869.5 oz   Height 66 in   BP 133/75 mmHg  Heparin level (unfractionated)     Status: None   Collection Time: 08/17/19  8:01 PM  Result Value Ref Range   Heparin Unfractionated 0.58 0.30 - 0.70 IU/mL    Comment: (NOTE) If heparin results are below expected values, and patient dosage has  been confirmed, suggest follow up testing of antithrombin III  levels. Performed at [facility], [address], [place], [place] [address]   CBC     Status: Abnormal   Collection Time: 08/18/19  4:50 AM  Result Value Ref Range   WBC 5.3 4.0 - 10.5 K/uL   RBC 4.86 3.87 - 5.11 MIL/uL   Hemoglobin 15.2 (H) 12.0 - 15.0 g/dL   HCT 47.8 (H) 29.5 - 62.1 %   MCV 95.3 80.0 - 100.0 fL   MCH 31.3 26.0 - 34.0 pg   MCHC 32.8 30.0 - 36.0 g/dL   RDW 30.8 65.7 - 84.6 %   Platelets 327 150 - 400 K/uL   nRBC 0.0 0.0 - 0.2 %    Comment: Performed at [facility], [address] [place], [place] [address]  Heparin level (unfractionated)     Status: None   Collection Time: 08/18/19  4:50 AM  Result Value Ref Range   Heparin Unfractionated 0.33 0.30 - 0.70 IU/mL    Comment: (NOTE) If heparin results are below expected values, and patient dosage has  been confirmed, suggest follow up testing of antithrombin III levels. Performed at [facility], [address] [place], [place] [address]   Protime-INR     Status: Abnormal   Collection Time: 08/18/19  4:50 AM  Result Value Ref Range   Prothrombin Time 15.6 (H) 11.4 - 15.2 seconds   INR 1.3 (H) 0.8 - 1.2    Comment: (NOTE) INR goal varies based on device and disease states. Performed at [facility], [address] [place], [place] [address]   Protime-INR     Status: Abnormal   Collection Time: 09/02/19  3:40 PM  Result Value Ref Range   Prothrombin Time 22.4 (H) 11.4 - 15.2 seconds   INR 2.0 (H) 0.8 - 1.2    Comment: (NOTE) INR goal varies based on device and disease  states. Performed at [facility], [address], [place], [place] [address]     RADIOGRAPHIC STUDIES: I have personally reviewed the radiological images as listed and agreed with the findings in the report. CT ANGIO CHEST PE W OR WO CONTRAST  Result Date: 08/16/2019 CLINICAL DATA:  Shortness of breath for the past 2 weeks. EXAM: CT ANGIOGRAPHY CHEST WITH CONTRAST TECHNIQUE: Multidetector CT imaging of the chest was performed using the standard protocol during bolus administration of intravenous contrast. Multiplanar CT image reconstructions and MIPs were obtained to evaluate the vascular anatomy. CONTRAST:  80mL OMNIPAQUE IOHEXOL 350 MG/ML SOLN COMPARISON:  Chest x-ray dated June 10, 2018. CT chest dated April 14, 2012. FINDINGS: Cardiovascular: Small acute nonocclusive subsegmental pulmonary emboli in the left upper lobe, right middle lobe, and both lower lobes. Normal heart size. No pericardial effusion. No thoracic aortic aneurysm dissection. Minimal atherosclerotic calcification of the aortic arch. Mediastinum/Nodes: No enlarged mediastinal, hilar, or axillary lymph nodes. Thyroid gland, trachea, and esophagus demonstrate no significant findings. Lungs/Pleura: No focal consolidation, pleural effusion, or pneumothorax. Unchanged 4 mm ground-glass nodule in the right upper lobe (series 6, image 60), stable since 2013, benign. No new pulmonary nodule. Unchanged calcified granuloma in the posterior right upper lobe. Chronic biapical pleuroparenchymal scarring, similar to prior study. Upper Abdomen: No acute abnormality. Small calcifications in the left adrenal gland again noted, likely related to prior hemorrhage. Musculoskeletal: No chest wall abnormality. No acute or significant osseous findings. Review of the MIP images confirms the above findings. IMPRESSION: 1. Small acute nonocclusive subsegmental pulmonary emboli in the left upper lobe, right middle lobe, and both lower lobes.  2.  Aortic  atherosclerosis (ICD10-I70.0). Critical Value/emergent results were called by telephone at the time of interpretation on 08/16/2019 at 2:56 pm to provider [facility] , who verbally acknowledged these results. Electronically Signed   By: Obie Dredge M.D.   On: 08/16/2019 14:57   US Venous Img Lower Bilateral (DVT)  Result Date: 08/17/2019 CLINICAL DATA:  PE seen on CT. EXAM: BILATERAL LOWER EXTREMITY VENOUS DOPPLER ULTRASOUND TECHNIQUE: Gray-scale sonography with graded compression, as well as color Doppler and duplex ultrasound were performed to evaluate the lower extremity deep venous systems from the level of the common femoral vein and including the common femoral, femoral, profunda femoral, popliteal and calf veins including the posterior tibial, peroneal and gastrocnemius veins when visible. The superficial great saphenous vein was also interrogated. Spectral Doppler was utilized to evaluate flow at rest and with distal augmentation maneuvers in the common femoral, femoral and popliteal veins. COMPARISON:  None. FINDINGS: RIGHT LOWER EXTREMITY Common Femoral Vein: No evidence of thrombus. Normal compressibility, respiratory phasicity and response to augmentation. Saphenofemoral Junction: No evidence of thrombus. Normal compressibility and flow on color Doppler imaging. Profunda Femoral Vein: No evidence of thrombus. Normal compressibility and flow on color Doppler imaging. Femoral Vein: No evidence of thrombus. Normal compressibility, respiratory phasicity and response to augmentation. Popliteal Vein: No evidence of thrombus. Normal compressibility, respiratory phasicity and response to augmentation. Calf Veins: The posterior tibial vein was unremarkable. The peroneal vein was not well visualized. Superficial Great Saphenous Vein: No evidence of thrombus. Normal compressibility. Venous Reflux:  None. Other Findings:  None. LEFT LOWER EXTREMITY Common Femoral Vein: No evidence of thrombus. Normal  compressibility, respiratory phasicity and response to augmentation. Saphenofemoral Junction: No evidence of thrombus. Normal compressibility and flow on color Doppler imaging. Profunda Femoral Vein: No evidence of thrombus. Normal compressibility and flow on color Doppler imaging. Femoral Vein: No evidence of thrombus. Normal compressibility, respiratory phasicity and response to augmentation. Popliteal Vein: No evidence of thrombus. Normal compressibility, respiratory phasicity and response to augmentation. Calf Veins: The posterior tibial vein was unremarkable. The peroneal vein was not well visualized. Superficial Great Saphenous Vein: No evidence of thrombus. Normal compressibility. Venous Reflux:  None. Other Findings:  None. IMPRESSION: No evidence of deep venous thrombosis in either lower extremity. Electronically Signed   By: Katherine Mantle M.D.   On: 08/17/2019 15:39   ECHOCARDIOGRAM COMPLETE  Result Date: 08/17/2019    ECHOCARDIOGRAM REPORT   Patient Name:   Cheryl Swanson Date of Exam: 08/17/2019 Medical Rec #:  [id number]      Height:       66.0 in Accession #:    [id number]     Weight:       116.8 lb Date of Birth:  May 31, 1931       BSA:          1.591 m Patient Age:    88 years       BP:           133/75 mmHg Patient Gender: F              HR:           94 bpm. [facility] Procedure: 2D Echo, Cardiac Doppler and Color Doppler Indications:    Pulmonary embolus  History:        Patient has prior history of Echocardiogram examinations, most                 recent 06/18/2018. LE  edema; Signs/Symptoms:Dyspnea.  Sonographer:    Lavenia Atlas RDCS Referring Phys: [id number] ASIA B ZIERLE-GHOSH IMPRESSIONS  1. Left ventricular ejection fraction, by estimation, is 60 to 65%. The left ventricle has normal function. The left ventricle has no regional wall motion abnormalities. Left ventricular diastolic parameters are consistent with Grade I diastolic dysfunction (impaired relaxation).  2.  Right ventricular systolic function is normal. The right ventricular size is normal. There is normal pulmonary artery systolic pressure.  3. The mitral valve is degenerative. Mild mitral valve regurgitation.  4. The aortic valve is tricuspid. Aortic valve regurgitation is not visualized. Mild aortic valve sclerosis is present, with no evidence of aortic valve stenosis.  5. The inferior vena cava is normal in size with greater than 50% respiratory variability, suggesting right atrial pressure of 3 mmHg. FINDINGS  Left Ventricle: Left ventricular ejection fraction, by estimation, is 60 to 65%. The left ventricle has normal function. The left ventricle has no regional wall motion abnormalities. The left ventricular internal cavity size was normal in size. There is  no left ventricular hypertrophy. Left ventricular diastolic parameters are consistent with Grade I diastolic dysfunction (impaired relaxation). Indeterminate filling pressures. Right Ventricle: The right ventricular size is normal. No increase in right ventricular wall thickness. Right ventricular systolic function is normal. There is normal pulmonary artery systolic pressure. The tricuspid regurgitant velocity is 2.50 m/s, and  with an assumed right atrial pressure of 3 mmHg, the estimated right ventricular systolic pressure is 28.0 mmHg. Left Atrium: Left atrial size was normal in size. Right Atrium: Right atrial size was normal in size. Pericardium: There is no evidence of pericardial effusion. Mitral Valve: The mitral valve is degenerative in appearance. There is mild calcification of the mitral valve leaflet(s). Mild mitral valve regurgitation. Tricuspid Valve: The tricuspid valve is grossly normal. Tricuspid valve regurgitation is trivial. Aortic Valve: The aortic valve is tricuspid. . There is mild thickening of the aortic valve. Aortic valve regurgitation is not visualized. Mild aortic valve sclerosis is present, with no evidence of aortic valve  stenosis. There is mild thickening of the aortic valve. Pulmonic Valve: The pulmonic valve was grossly normal. Pulmonic valve regurgitation is not visualized. Aorta: The aortic root is normal in size and structure. Venous: The inferior vena cava is normal in size with greater than 50% respiratory variability, suggesting right atrial pressure of 3 mmHg. IAS/Shunts: No atrial level shunt detected by color flow Doppler.  LEFT VENTRICLE PLAX 2D LVIDd:         2.93 cm  Diastology LVIDs:         2.18 cm  LV e' lateral:   6.74 cm/s LV PW:         0.93 cm  LV E/e' lateral: 13.7 LV IVS:        0.85 cm  LV e' medial:    7.29 cm/s LVOT diam:     2.00 cm  LV E/e' medial:  12.6 LV SV:         69 LV SV Index:   43 LVOT Area:     3.14 cm  RIGHT VENTRICLE RV Basal diam:  2.62 cm RV S prime:     18.30 cm/s LEFT ATRIUM             Index LA diam:        2.10 cm 1.32 cm/m LA Vol (A2C):   20.8 ml 13.07 ml/m LA Vol (A4C):   39.0 ml 24.51 ml/m LA Biplane Vol: 30.5 ml 19.17  ml/m  AORTIC VALVE LVOT Vmax:   104.00 cm/s LVOT Vmean:  64.200 cm/s LVOT VTI:    0.220 m  AORTA Ao Root diam: 2.60 cm MITRAL VALVE                TRICUSPID VALVE MV Area (PHT): 3.33 cm     TR Peak grad:   25.0 mmHg MV Decel Time: 228 msec     TR Vmax:        250.00 cm/s MV E velocity: 92.20 cm/s MV A velocity: 129.00 cm/s  SHUNTS MV E/A ratio:  0.71         Systemic VTI:  0.22 m                             Systemic Diam: 2.00 cm Prentice Docker MD Electronically signed by Prentice Docker MD Signature Date/Time: 08/17/2019/2:26:50 PM    Final   I have independently examined this patient and elicited history.  I agree with the HPI written by my nurse practitioner Corliss Skains, FNP.  I have independently formulated my assessment and plan.  ASSESSMENT & PLAN:  Pulmonary embolism (HCC) 1.  Unprovoked recurrent DVT and pulmonary embolism: -DVT on 02/03/2019 of both right posterior tibial veins with no extension to the proximal system.  She received Coumadin  for three and half months.  Coumadin discontinued 05/21/2019. -CT angio chest PE on 08/16/2019 showed small acute nonocclusive subsegmental pulmonary emboli in the left upper lobe, right middle lobe and both lower lobes. -Ultrasound of the lower extremities on 08/17/2019 did not show any evidence of DVT. -Upon further questioning, both episodes of thrombosis happened without any provoking factors. -Even though she is limited by her rheumatoid arthritis, she reports that she has been mobile (lives independently and does all her ADLs and IADLs) prior to each episode. -Based on the history, I have recommended indefinite anticoagulation. -We will check lupus anticoagulant, anticardiolipin antibody, antibeta-2 glycoprotein 1 antibody, factor V Leiden and prothrombin gene mutations. -We will see her back in 2 to 3 weeks for follow-up.  2.  Rheumatoid arthritis: -She has rheumatoid arthritis mainly affecting the upper extremities. -She is on methotrexate for many years.     All questions were answered. The patient knows to call the clinic with any problems, questions or concerns.      Doreatha Massed, MD 09/02/19 6:22 PM

## 2019-09-02 NOTE — Assessment & Plan Note (Addendum)
1.  Unprovoked recurrent DVT and pulmonary embolism: -DVT on 02/03/2019 of both right posterior tibial veins with no extension to the proximal system.  She received Coumadin for three and half months.  Coumadin discontinued 05/21/2019. -CT angio chest PE on 08/16/2019 showed small acute nonocclusive subsegmental pulmonary emboli in the left upper lobe, right middle lobe and both lower lobes. -Ultrasound of the lower extremities on 08/17/2019 did not show any evidence of DVT. -Upon further questioning, both episodes of thrombosis happened without any provoking factors. -Even though she is limited by her rheumatoid arthritis, she reports that she has been mobile (lives independently and does all her ADLs and IADLs) prior to each episode. -Based on the history, I have recommended indefinite anticoagulation. -We will check lupus anticoagulant, anticardiolipin antibody, antibeta-2 glycoprotein 1 antibody, factor V Leiden and prothrombin gene mutations. -We will see her back in 2 to 3 weeks for follow-up.  2.  Rheumatoid arthritis: -She has rheumatoid arthritis mainly affecting the upper extremities. -She is on methotrexate for many years.

## 2019-09-04 LAB — LUPUS ANTICOAGULANT PANEL
DRVVT: 58.5 s — ABNORMAL HIGH (ref 0.0–47.0)
PTT Lupus Anticoagulant: 40.3 s (ref 0.0–51.9)

## 2019-09-04 LAB — BETA-2-GLYCOPROTEIN I ABS, IGG/M/A
Beta-2 Glyco I IgG: <9 GPI IgG units (ref 0–20)
Beta-2-Glycoprotein I IgA: <9 GPI IgA units (ref 0–25)
Beta-2-Glycoprotein I IgM: <9 GPI IgM units (ref 0–32)

## 2019-09-04 LAB — CARDIOLIPIN ANTIBODIES, IGG, IGM, IGA
Anticardiolipin IgA: <9 APL U/mL (ref 0–11)
Anticardiolipin IgG: 10 GPL U/mL (ref 0–14)
Anticardiolipin IgM: 15 MPL U/mL — ABNORMAL HIGH (ref 0–12)

## 2019-09-04 LAB — DRVVT CONFIRM: dRVVT Confirm: 1.2 ratio (ref 0.8–1.2)

## 2019-09-04 LAB — DRVVT MIX: dRVVT Mix: 41 s — ABNORMAL HIGH (ref 0.0–40.4)

## 2019-09-07 LAB — FACTOR 5 LEIDEN

## 2019-09-07 LAB — PROTHROMBIN GENE MUTATION

## 2019-09-28 ENCOUNTER — Inpatient Hospital Stay (HOSPITAL_COMMUNITY): Payer: Medicare Other | Attending: Hematology | Admitting: Hematology

## 2019-09-28 ENCOUNTER — Other Ambulatory Visit: Payer: Self-pay

## 2019-09-28 VITALS — BP 137/81 | HR 92 | Temp 97.5°F | Resp 16 | Wt 116.0 lb

## 2019-09-28 DIAGNOSIS — Z86718 Personal history of other venous thrombosis and embolism: Secondary | ICD-10-CM | POA: Diagnosis present

## 2019-09-28 DIAGNOSIS — M069 Rheumatoid arthritis, unspecified: Secondary | ICD-10-CM | POA: Diagnosis not present

## 2019-09-28 DIAGNOSIS — I2694 Multiple subsegmental pulmonary emboli without acute cor pulmonale: Secondary | ICD-10-CM | POA: Diagnosis not present

## 2019-09-28 DIAGNOSIS — Z79899 Other long term (current) drug therapy: Secondary | ICD-10-CM | POA: Diagnosis not present

## 2019-09-28 DIAGNOSIS — Z7901 Long term (current) use of anticoagulants: Secondary | ICD-10-CM | POA: Diagnosis not present

## 2019-09-28 DIAGNOSIS — Z86711 Personal history of pulmonary embolism: Secondary | ICD-10-CM | POA: Insufficient documentation

## 2019-09-28 NOTE — Patient Instructions (Signed)
[facility] at [facility] Discharge Instructions  You were seen today by Dr. Ellin Saba. He went over your recent test results. Your blood work was negative for any findings. This means that you will need to be on anticoagulation for the rest of your life. Continue taking the coumadin. He will see you back in 6 months for labs and follow up.   Thank you for choosing [facility] at [facility] to provide your oncology and hematology care.  To afford each patient quality time with our provider, please arrive at least 15 minutes before your scheduled appointment time.   If you have a lab appointment with the Cancer Center please come in thru the  Main Entrance and check in at the main information desk  You need to re-schedule your appointment should you arrive 10 or more minutes late.  We strive to give you quality time with our providers, and arriving late affects you and other patients whose appointments are after yours.  Also, if you no show three or more times for appointments you may be dismissed from the clinic at the providers discretion.     Again, thank you for choosing [facility].  Our hope is that these requests will decrease the amount of time that you wait before being seen by our physicians.       _____________________________________________________________  Should you have questions after your visit to [facility], please contact our office at [phone] between the hours of 8:00 a.m. and 4:30 p.m.  Voicemails left after 4:00 p.m. will not be returned until the following business day.  For prescription refill requests, have your pharmacy contact our office and allow 72 hours.    Cancer Center Support Programs:   > Cancer Support Group  2nd Tuesday of the month 1pm-2pm, Journey Room

## 2019-09-28 NOTE — Assessment & Plan Note (Signed)
1.  Unprovoked recurrent DVT and PE: -First episode of unprovoked DVT on 02/03/2019, received Coumadin for 3 and half months, discontinued on 05/21/2019. -Unprovoked pulmonary embolism on 08/16/2019 with small acute nonocclusive segmental pulmonary emboli in the left lower lobe, right middle lobe and both lower lobes. -Ultrasound of the lower extremities on 08/17/2019 did not show any evidence of DVT. -Even though she is physically limited by her rheumatoid arthritis, she is mobile and lives independently and does ADLs and IADLs. -She is tolerating Coumadin very well.  Last INR was 2.0. -Lupus anticoagulant, anticardiolipin antibody, antibeta-2 glycoprotein 1 antibody, factor V Leiden and prothrombin gene mutation were negative. -She was recommended indefinite anticoagulation at this time.  She denies any falls.  We will see her periodically to assess the risk-benefit ratio.  I will see her back in 6 months for follow-up.  2.  Rheumatoid arthritis: -It mainly affects her hands.  She is on methotrexate for many years.

## 2019-09-28 NOTE — Progress Notes (Signed)
[facility] [address], [place] [address]   CLINIC:  Medical Oncology/Hematology  PCP:  [name], MD [address] [place] [address] [phone]   REASON FOR VISIT:  Follow-up for unprovoked pulmonary embolism.  CURRENT THERAPY: Coumadin.   INTERVAL HISTORY:  Cheryl Swanson 86 y.o. female seen for follow-up of recurrent thromboembolism.  She is taking Coumadin.  Denies any bleeding issues.  Denies any falls.  Reports appetite and energy levels are 50%.  No fevers or infections.    REVIEW OF SYSTEMS:  Review of Systems  Constitutional: Positive for fatigue.  All other systems reviewed and are negative.    PAST MEDICAL/SURGICAL HISTORY:  Past Medical History:  Diagnosis Date  . Glaucoma   . Mitral valve disorder   . Peptic ulcer   . Rheumatoid arthritis(714.0)   . Tricuspid valve disorder    Past Surgical History:  Procedure Laterality Date  . ABDOMINAL HYSTERECTOMY  1971  . HAND SURGERY  1980's   left     SOCIAL HISTORY:  Social History   Socioeconomic History  . Marital status: Widowed    Spouse name: Not on file  . Number of children: Not on file  . Years of education: Not on file  . Highest education level: Not on file  Occupational History  . Not on file  Tobacco Use  . Smoking status: Never Smoker  . Smokeless tobacco: Never Used  Substance and Sexual Activity  . Alcohol use: No  . Drug use: No  . Sexual activity: Not on file  Other Topics Concern  . Not on file  Social History Narrative  . Not on file   Social Determinants of Health   Financial Resource Strain:   . Difficulty of Paying Living Expenses:   Food Insecurity:   . Worried About Programme researcher, broadcasting/film/video in the Last Year:   . Barista in the Last Year:   Transportation Needs:   . Freight forwarder (Medical):   Marland Kitchen Lack of Transportation (Non-Medical):   Physical Activity:   . Days of Exercise per Week:   . Minutes of Exercise per  Session:   Stress:   . Feeling of Stress :   Social Connections:   . Frequency of Communication with Friends and Family:   . Frequency of Social Gatherings with Friends and Family:   . Attends Religious Services:   . Active Member of Clubs or Organizations:   . Attends Banker Meetings:   Marland Kitchen Marital Status:   Intimate Partner Violence:   . Fear of Current or Ex-Partner:   . Emotionally Abused:   Marland Kitchen Physically Abused:   . Sexually Abused:     FAMILY HISTORY:  Family History  Problem Relation Age of Onset  . Cancer Mother   . Cancer Father   . Cancer Brother   . Cancer Brother   . Cancer Son        prostate-remission    CURRENT MEDICATIONS:  Outpatient Encounter Medications as of 09/28/2019  Medication Sig  . calcium-vitamin D (OSCAL WITH D) 500-200 MG-UNIT per tablet Take 1 tablet by mouth 2 (two) times daily.  . Glycerin-Hypromellose-PEG 400 (DRY EYE RELIEF DROPS) 0.2-0.2-1 % SOLN Apply 1 drop to eye in the morning and at bedtime.  Marland Kitchen latanoprost (XALATAN) 0.005 % ophthalmic solution Place 1 drop into both eyes at bedtime.   . megestrol (MEGACE) 40 MG/ML suspension Take 400 mg by mouth daily.  Marland Kitchen  methotrexate (RHEUMATREX) 2.5 MG tablet Take 9 tablets by mouth every Monday.   . simvastatin (ZOCOR) 20 MG tablet Take 20 mg by mouth every evening.   . timolol (BETIMOL) 0.5 % ophthalmic solution Place 1 drop into both eyes 2 (two) times daily.  Marland Kitchen warfarin (COUMADIN) 2.5 MG tablet Take 1 tablet (2.5 mg total) by mouth daily at 4 PM.  . albuterol (PROVENTIL HFA;VENTOLIN HFA) 108 (90 Base) MCG/ACT inhaler Inhale 2 puffs into the lungs every 4 (four) hours as needed for wheezing or shortness of breath. (Patient not taking: Reported on 09/28/2019)  . traMADol (ULTRAM) 50 MG tablet Take 50 mg by mouth 3 (three) times daily as needed for moderate pain or severe pain.   . [DISCONTINUED] enoxaparin (LOVENOX) 60 MG/0.6ML injection Inject 0.5 mLs (50 mg total) into the skin every 12  (twelve) hours for 7 days.  . [DISCONTINUED] timolol (TIMOPTIC) 0.5 % ophthalmic solution 1 drop every morning.   No facility-administered encounter medications on file as of 09/28/2019.    ALLERGIES:  No Known Allergies   PHYSICAL EXAM:  ECOG Performance status: 1  Vitals:   09/28/19 1536  BP: 137/81  Pulse: 92  Resp: 16  Temp: (!) 97.5 F (36.4 C)  SpO2: 99%   Filed Weights   09/28/19 1536  Weight: 116 lb (52.6 kg)   Physical Exam Vitals reviewed.  Constitutional:      Appearance: Normal appearance.  Pulmonary:     Effort: Pulmonary effort is normal.     Breath sounds: Normal breath sounds.  Skin:    General: Skin is warm.  Neurological:     General: No focal deficit present.     Mental Status: She is alert and oriented to person, place, and time.  Psychiatric:        Mood and Affect: Mood normal.        Behavior: Behavior normal.      LABORATORY DATA:  I have reviewed the labs as listed.  CBC    Component Value Date/Time   WBC 5.3 08/18/2019 0450   RBC 4.86 08/18/2019 0450   HGB 15.2 (H) 08/18/2019 0450   HCT 46.3 (H) 08/18/2019 0450   PLT 327 08/18/2019 0450   MCV 95.3 08/18/2019 0450   MCH 31.3 08/18/2019 0450   MCHC 32.8 08/18/2019 0450   RDW 13.9 08/18/2019 0450   LYMPHSABS 2.1 08/17/2019 0543   MONOABS 1.0 08/17/2019 0543   EOSABS 0.2 08/17/2019 0543   BASOSABS 0.1 08/17/2019 0543   CMP Latest Ref Rng & Units 08/17/2019 08/16/2019 07/19/2019  Glucose 70 - 99 mg/dL 81 103(H) 90  BUN 8 - 23 mg/dL 11 12 13   Creatinine 0.44 - 1.00 mg/dL 0.63 0.83 0.78  Sodium 135 - 145 mmol/L 139 136 140  Potassium 3.5 - 5.1 mmol/L 4.0 3.4(L) 3.7  Chloride 98 - 111 mmol/L 109 103 106  CO2 22 - 32 mmol/L 21(L) 24 27  Calcium 8.9 - 10.3 mg/dL 9.5 9.9 9.7  Total Protein 6.5 - 8.1 g/dL 6.5 - 7.4  Total Bilirubin 0.3 - 1.2 mg/dL 1.8(H) - 1.1  Alkaline Phos 38 - 126 U/L 41 - 42  AST 15 - 41 U/L 17 - 14(L)  ALT 0 - 44 U/L 8 - 8    DIAGNOSTIC IMAGING:  I have  reviewed scans.  ASSESSMENT & PLAN:  Pulmonary embolism ([place]) 1.  Unprovoked recurrent DVT and PE: -First episode of unprovoked DVT on 02/03/2019, received Coumadin for 3 and half months,  discontinued on 05/21/2019. -Unprovoked pulmonary embolism on 08/16/2019 with small acute nonocclusive segmental pulmonary emboli in the left lower lobe, right middle lobe and both lower lobes. -Ultrasound of the lower extremities on 08/17/2019 did not show any evidence of DVT. -Even though she is physically limited by her rheumatoid arthritis, she is mobile and lives independently and does ADLs and IADLs. -She is tolerating Coumadin very well.  Last INR was 2.0. -Lupus anticoagulant, anticardiolipin antibody, antibeta-2 glycoprotein 1 antibody, factor V Leiden and prothrombin gene mutation were negative. -She was recommended indefinite anticoagulation at this time.  She denies any falls.  We will see her periodically to assess the risk-benefit ratio.  I will see her back in 6 months for follow-up.  2.  Rheumatoid arthritis: -It mainly affects her hands.  She is on methotrexate for many years.     Orders placed this encounter:  Orders Placed This Encounter  Procedures  . CBC with Differential  . D-dimer, quantitative      Doreatha Massed, MD [facility] [phone]

## 2019-10-19 ENCOUNTER — Other Ambulatory Visit (HOSPITAL_COMMUNITY)
Admission: RE | Admit: 2019-10-19 | Discharge: 2019-10-19 | Disposition: A | Discharge status: Home / Self Care | Payer: Medicare Other | Source: Ambulatory Visit | Attending: Family Medicine | Admitting: Family Medicine

## 2019-10-19 ENCOUNTER — Other Ambulatory Visit: Payer: Self-pay

## 2019-10-19 DIAGNOSIS — Z7901 Long term (current) use of anticoagulants: Secondary | ICD-10-CM | POA: Diagnosis present

## 2019-10-19 DIAGNOSIS — R634 Abnormal weight loss: Secondary | ICD-10-CM | POA: Insufficient documentation

## 2019-10-19 DIAGNOSIS — M069 Rheumatoid arthritis, unspecified: Secondary | ICD-10-CM | POA: Insufficient documentation

## 2019-10-19 LAB — COMPREHENSIVE METABOLIC PANEL
ALT: 11 U/L (ref 0–44)
AST: 21 U/L (ref 15–41)
Albumin: 3.9 g/dL (ref 3.5–5.0)
Alkaline Phosphatase: 47 U/L (ref 38–126)
Anion gap: 6 (ref 5–15)
BUN: 10 mg/dL (ref 8–23)
CO2: 27 mmol/L (ref 22–32)
Calcium: 9.8 mg/dL (ref 8.9–10.3)
Chloride: 108 mmol/L (ref 98–111)
Creatinine, Ser: 0.76 mg/dL (ref 0.44–1.00)
GFR calc Af Amer: >60 mL/min (ref >60)
GFR calc non Af Amer: >60 mL/min (ref >60)
Glucose, Bld: 91 mg/dL (ref 70–99)
Potassium: 3.7 mmol/L (ref 3.5–5.1)
Sodium: 141 mmol/L (ref 135–145)
Total Bilirubin: 1.2 mg/dL (ref 0.3–1.2)
Total Protein: 7.6 g/dL (ref 6.5–8.1)

## 2019-10-19 LAB — CBC
HCT: 46.4 % — ABNORMAL HIGH (ref 36.0–46.0)
Hemoglobin: 15.1 g/dL — ABNORMAL HIGH (ref 12.0–15.0)
MCH: 31.7 pg (ref 26.0–34.0)
MCHC: 32.5 g/dL (ref 30.0–36.0)
MCV: 97.5 fL (ref 80.0–100.0)
Platelets: 315 10*3/uL (ref 150–400)
RBC: 4.76 MIL/uL (ref 3.87–5.11)
RDW: 14.7 % (ref 11.5–15.5)
WBC: 5 10*3/uL (ref 4.0–10.5)
nRBC: 0 % (ref 0.0–0.2)

## 2019-11-04 ENCOUNTER — Encounter (HOSPITAL_COMMUNITY)
Admission: RE | Admit: 2019-11-04 | Discharge: 2019-11-04 | Disposition: A | Discharge status: Home / Self Care | Payer: Medicare Other | Source: Ambulatory Visit | Attending: Family Medicine | Admitting: Family Medicine

## 2019-11-04 DIAGNOSIS — Z7901 Long term (current) use of anticoagulants: Secondary | ICD-10-CM | POA: Diagnosis not present

## 2019-11-04 LAB — PROTIME-INR
INR: 2.1 — ABNORMAL HIGH (ref 0.8–1.2)
Prothrombin Time: 22.4 seconds — ABNORMAL HIGH (ref 11.4–15.2)

## 2019-12-03 ENCOUNTER — Encounter (HOSPITAL_COMMUNITY)
Admission: RE | Admit: 2019-12-03 | Discharge: 2019-12-03 | Disposition: A | Discharge status: Home / Self Care | Payer: Medicare Other | Source: Ambulatory Visit | Attending: Family Medicine | Admitting: Family Medicine

## 2019-12-03 DIAGNOSIS — Z7901 Long term (current) use of anticoagulants: Secondary | ICD-10-CM | POA: Insufficient documentation

## 2019-12-03 LAB — PROTIME-INR
INR: 3.6 — ABNORMAL HIGH (ref 0.8–1.2)
Prothrombin Time: 34.4 seconds — ABNORMAL HIGH (ref 11.4–15.2)

## 2019-12-21 ENCOUNTER — Encounter (HOSPITAL_COMMUNITY)
Admission: RE | Admit: 2019-12-21 | Discharge: 2019-12-21 | Disposition: A | Discharge status: Home / Self Care | Payer: Medicare Other | Source: Ambulatory Visit | Attending: Family Medicine | Admitting: Family Medicine

## 2019-12-21 DIAGNOSIS — Z7901 Long term (current) use of anticoagulants: Secondary | ICD-10-CM | POA: Diagnosis present

## 2019-12-21 LAB — PROTIME-INR
INR: 2.5 — ABNORMAL HIGH (ref 0.8–1.2)
Prothrombin Time: 25.9 seconds — ABNORMAL HIGH (ref 11.4–15.2)

## 2020-01-21 ENCOUNTER — Other Ambulatory Visit (HOSPITAL_COMMUNITY)
Admission: RE | Admit: 2020-01-21 | Discharge: 2020-01-21 | Disposition: A | Discharge status: Home / Self Care | Payer: Medicare Other | Source: Ambulatory Visit | Attending: Family Medicine | Admitting: Family Medicine

## 2020-01-21 ENCOUNTER — Other Ambulatory Visit: Payer: Self-pay

## 2020-01-21 DIAGNOSIS — Z7901 Long term (current) use of anticoagulants: Secondary | ICD-10-CM | POA: Insufficient documentation

## 2020-01-21 LAB — PROTIME-INR
INR: 2.5 — ABNORMAL HIGH (ref 0.8–1.2)
Prothrombin Time: 25.8 seconds — ABNORMAL HIGH (ref 11.4–15.2)

## 2020-02-15 ENCOUNTER — Other Ambulatory Visit (HOSPITAL_COMMUNITY): Payer: Self-pay | Admitting: Family Medicine

## 2020-02-15 DIAGNOSIS — R5383 Other fatigue: Secondary | ICD-10-CM

## 2020-02-18 ENCOUNTER — Other Ambulatory Visit (HOSPITAL_COMMUNITY)
Admission: RE | Admit: 2020-02-18 | Discharge: 2020-02-18 | Disposition: A | Discharge status: Home / Self Care | Payer: Medicare Other | Source: Ambulatory Visit | Attending: Family Medicine | Admitting: Family Medicine

## 2020-02-18 ENCOUNTER — Other Ambulatory Visit: Payer: Self-pay

## 2020-02-18 ENCOUNTER — Ambulatory Visit (HOSPITAL_COMMUNITY)
Admission: RE | Admit: 2020-02-18 | Discharge: 2020-02-18 | Disposition: A | Discharge status: Home / Self Care | Payer: Medicare Other | Source: Ambulatory Visit | Attending: Family Medicine | Admitting: Family Medicine

## 2020-02-18 DIAGNOSIS — J449 Chronic obstructive pulmonary disease, unspecified: Secondary | ICD-10-CM | POA: Insufficient documentation

## 2020-02-18 DIAGNOSIS — M25561 Pain in right knee: Secondary | ICD-10-CM | POA: Insufficient documentation

## 2020-02-18 DIAGNOSIS — E559 Vitamin D deficiency, unspecified: Secondary | ICD-10-CM | POA: Insufficient documentation

## 2020-02-18 DIAGNOSIS — R5383 Other fatigue: Secondary | ICD-10-CM | POA: Diagnosis present

## 2020-02-18 DIAGNOSIS — Z79891 Long term (current) use of opiate analgesic: Secondary | ICD-10-CM | POA: Insufficient documentation

## 2020-02-18 DIAGNOSIS — I700 Atherosclerosis of aorta: Secondary | ICD-10-CM | POA: Diagnosis not present

## 2020-02-18 LAB — CBC WITH DIFFERENTIAL/PLATELET
Abs Immature Granulocytes: 0.01 10*3/uL (ref 0.00–0.07)
Basophils Absolute: 0 10*3/uL (ref 0.0–0.1)
Basophils Relative: 1 %
Eosinophils Absolute: 0.2 10*3/uL (ref 0.0–0.5)
Eosinophils Relative: 4 %
HCT: 45.8 % (ref 36.0–46.0)
Hemoglobin: 15 g/dL (ref 12.0–15.0)
Immature Granulocytes: 0 %
Lymphocytes Relative: 24 %
Lymphs Abs: 1.4 10*3/uL (ref 0.7–4.0)
MCH: 31.5 pg (ref 26.0–34.0)
MCHC: 32.8 g/dL (ref 30.0–36.0)
MCV: 96.2 fL (ref 80.0–100.0)
Monocytes Absolute: 0.6 10*3/uL (ref 0.1–1.0)
Monocytes Relative: 10 %
Neutro Abs: 3.6 10*3/uL (ref 1.7–7.7)
Neutrophils Relative %: 61 %
Platelets: 392 10*3/uL (ref 150–400)
RBC: 4.76 MIL/uL (ref 3.87–5.11)
RDW: 13.2 % (ref 11.5–15.5)
WBC: 5.9 10*3/uL (ref 4.0–10.5)
nRBC: 0 % (ref 0.0–0.2)

## 2020-02-18 LAB — LIPID PANEL
Cholesterol: 157 mg/dL (ref 0–200)
HDL: 45 mg/dL (ref >40)
LDL Cholesterol: 97 mg/dL (ref 0–99)
Total CHOL/HDL Ratio: 3.5 RATIO
Triglycerides: 73 mg/dL (ref <150)
VLDL: 15 mg/dL (ref 0–40)

## 2020-02-18 LAB — SEDIMENTATION RATE: Sed Rate: 50 mm/hr — ABNORMAL HIGH (ref 0–22)

## 2020-02-18 LAB — COMPREHENSIVE METABOLIC PANEL
ALT: 10 U/L (ref 0–44)
AST: 20 U/L (ref 15–41)
Albumin: 3.8 g/dL (ref 3.5–5.0)
Alkaline Phosphatase: 45 U/L (ref 38–126)
Anion gap: 8 (ref 5–15)
BUN: 14 mg/dL (ref 8–23)
CO2: 26 mmol/L (ref 22–32)
Calcium: 10.1 mg/dL (ref 8.9–10.3)
Chloride: 106 mmol/L (ref 98–111)
Creatinine, Ser: 1.03 mg/dL — ABNORMAL HIGH (ref 0.44–1.00)
GFR calc Af Amer: 57 mL/min — ABNORMAL LOW (ref >60)
GFR calc non Af Amer: 49 mL/min — ABNORMAL LOW (ref >60)
Glucose, Bld: 96 mg/dL (ref 70–99)
Potassium: 3.9 mmol/L (ref 3.5–5.1)
Sodium: 140 mmol/L (ref 135–145)
Total Bilirubin: 0.9 mg/dL (ref 0.3–1.2)
Total Protein: 7.9 g/dL (ref 6.5–8.1)

## 2020-02-18 LAB — TSH: TSH: 2.757 u[IU]/mL (ref 0.350–4.500)

## 2020-02-18 LAB — C-REACTIVE PROTEIN: CRP: 1 mg/dL — ABNORMAL HIGH (ref <1.0)

## 2020-02-18 LAB — VITAMIN B12: Vitamin B-12: 314 pg/mL (ref 180–914)

## 2020-02-18 LAB — VITAMIN D 25 HYDROXY (VIT D DEFICIENCY, FRACTURES): Vit D, 25-Hydroxy: 20.58 ng/mL — ABNORMAL LOW (ref 30–100)

## 2020-03-08 ENCOUNTER — Telehealth: Payer: Self-pay | Admitting: *Deleted

## 2020-03-08 ENCOUNTER — Telehealth: Payer: Self-pay | Admitting: Family Medicine

## 2020-03-08 DIAGNOSIS — I5032 Chronic diastolic (congestive) heart failure: Secondary | ICD-10-CM

## 2020-03-08 NOTE — Telephone Encounter (Signed)
Pre-cert Verification for the following procedure    ECHO   DATE:  03/13/2020  LOCATION:[facility]

## 2020-03-08 NOTE — Telephone Encounter (Signed)
Order for echo placed

## 2020-03-13 ENCOUNTER — Other Ambulatory Visit: Payer: Self-pay

## 2020-03-13 ENCOUNTER — Ambulatory Visit (HOSPITAL_COMMUNITY)
Admission: RE | Admit: 2020-03-13 | Discharge: 2020-03-13 | Disposition: A | Discharge status: Home / Self Care | Payer: Medicare Other | Source: Ambulatory Visit | Attending: Family Medicine | Admitting: Family Medicine

## 2020-03-13 DIAGNOSIS — I5032 Chronic diastolic (congestive) heart failure: Secondary | ICD-10-CM | POA: Insufficient documentation

## 2020-03-13 LAB — ECHOCARDIOGRAM COMPLETE
AR max vel: 1.31 cm2
AV Area VTI: 1.7 cm2
AV Area mean vel: 1.15 cm2
AV Mean grad: 3.7 mmHg
AV Peak grad: 7.7 mmHg
Ao pk vel: 1.39 m/s
Area-P 1/2: 1.68 cm2
S' Lateral: 2.29 cm

## 2020-03-13 NOTE — Progress Notes (Signed)
*  PRELIMINARY RESULTS* Echocardiogram 2D Echocardiogram has been performed.  Stacey Drain 03/13/2020, 12:51 PM

## 2020-03-28 ENCOUNTER — Other Ambulatory Visit: Payer: Self-pay

## 2020-03-28 ENCOUNTER — Inpatient Hospital Stay (HOSPITAL_COMMUNITY): Payer: Medicare Other | Attending: Hematology

## 2020-03-28 DIAGNOSIS — Z86711 Personal history of pulmonary embolism: Secondary | ICD-10-CM | POA: Diagnosis present

## 2020-03-28 DIAGNOSIS — M79604 Pain in right leg: Secondary | ICD-10-CM | POA: Diagnosis not present

## 2020-03-28 DIAGNOSIS — M79605 Pain in left leg: Secondary | ICD-10-CM | POA: Insufficient documentation

## 2020-03-28 DIAGNOSIS — R5383 Other fatigue: Secondary | ICD-10-CM | POA: Diagnosis not present

## 2020-03-28 DIAGNOSIS — Z7901 Long term (current) use of anticoagulants: Secondary | ICD-10-CM | POA: Diagnosis not present

## 2020-03-28 DIAGNOSIS — R630 Anorexia: Secondary | ICD-10-CM | POA: Insufficient documentation

## 2020-03-28 DIAGNOSIS — Z86718 Personal history of other venous thrombosis and embolism: Secondary | ICD-10-CM | POA: Insufficient documentation

## 2020-03-28 DIAGNOSIS — Z79899 Other long term (current) drug therapy: Secondary | ICD-10-CM | POA: Insufficient documentation

## 2020-03-28 DIAGNOSIS — M069 Rheumatoid arthritis, unspecified: Secondary | ICD-10-CM | POA: Diagnosis not present

## 2020-03-28 DIAGNOSIS — I2694 Multiple subsegmental pulmonary emboli without acute cor pulmonale: Secondary | ICD-10-CM

## 2020-03-28 DIAGNOSIS — H409 Unspecified glaucoma: Secondary | ICD-10-CM | POA: Diagnosis not present

## 2020-03-28 LAB — CBC WITH DIFFERENTIAL/PLATELET
Abs Immature Granulocytes: 0.01 10*3/uL (ref 0.00–0.07)
Basophils Absolute: 0 10*3/uL (ref 0.0–0.1)
Basophils Relative: 1 %
Eosinophils Absolute: 0.3 10*3/uL (ref 0.0–0.5)
Eosinophils Relative: 6 %
HCT: 44.8 % (ref 36.0–46.0)
Hemoglobin: 14.5 g/dL (ref 12.0–15.0)
Immature Granulocytes: 0 %
Lymphocytes Relative: 30 %
Lymphs Abs: 1.3 10*3/uL (ref 0.7–4.0)
MCH: 31.8 pg (ref 26.0–34.0)
MCHC: 32.4 g/dL (ref 30.0–36.0)
MCV: 98.2 fL (ref 80.0–100.0)
Monocytes Absolute: 0.9 10*3/uL (ref 0.1–1.0)
Monocytes Relative: 20 %
Neutro Abs: 1.9 10*3/uL (ref 1.7–7.7)
Neutrophils Relative %: 43 %
Platelets: 340 10*3/uL (ref 150–400)
RBC: 4.56 MIL/uL (ref 3.87–5.11)
RDW: 14.6 % (ref 11.5–15.5)
WBC: 4.3 10*3/uL (ref 4.0–10.5)
nRBC: 0 % (ref 0.0–0.2)

## 2020-03-28 LAB — D-DIMER, QUANTITATIVE: D-Dimer, Quant: 0.65 ug/mL-FEU — ABNORMAL HIGH (ref 0.00–0.50)

## 2020-04-03 ENCOUNTER — Other Ambulatory Visit: Payer: Self-pay | Admitting: Family Medicine

## 2020-04-03 ENCOUNTER — Other Ambulatory Visit (HOSPITAL_COMMUNITY): Payer: Self-pay | Admitting: Family Medicine

## 2020-04-03 DIAGNOSIS — R29898 Other symptoms and signs involving the musculoskeletal system: Secondary | ICD-10-CM

## 2020-04-04 ENCOUNTER — Inpatient Hospital Stay (HOSPITAL_BASED_OUTPATIENT_CLINIC_OR_DEPARTMENT_OTHER): Payer: Medicare Other | Admitting: Hematology

## 2020-04-04 ENCOUNTER — Other Ambulatory Visit: Payer: Self-pay

## 2020-04-04 VITALS — BP 135/73 | HR 90 | Temp 97.3°F | Resp 16 | Wt 116.8 lb

## 2020-04-04 DIAGNOSIS — I2694 Multiple subsegmental pulmonary emboli without acute cor pulmonale: Secondary | ICD-10-CM

## 2020-04-04 DIAGNOSIS — Z86711 Personal history of pulmonary embolism: Secondary | ICD-10-CM | POA: Diagnosis not present

## 2020-04-04 NOTE — Patient Instructions (Signed)
[facility] at [facility] Discharge Instructions  You were seen today by Dr. Katragadda. He went over your recent results. Your next appointment will be with the nurse practitioner in 1 year for labs and follow up.   Thank you for choosing [facility] at [facility] to provide your oncology and hematology care.  To afford each patient quality time with our provider, please arrive at least 15 minutes before your scheduled appointment time.   If you have a lab appointment with the Cancer Center please come in thru the Main Entrance and check in at the main information desk  You need to re-schedule your appointment should you arrive 10 or more minutes late.  We strive to give you quality time with our providers, and arriving late affects you and other patients whose appointments are after yours.  Also, if you no show three or more times for appointments you may be dismissed from the clinic at the providers discretion.     Again, thank you for choosing [facility].  Our hope is that these requests will decrease the amount of time that you wait before being seen by our physicians.       _____________________________________________________________  Should you have questions after your visit to [facility], please contact our office at [phone] between the hours of 8:00 a.m. and 4:30 p.m.  Voicemails left after 4:00 p.m. will not be returned until the following business day.  For prescription refill requests, have your pharmacy contact our office and allow 72 hours.    Cancer Center Support Programs:   > Cancer Support Group  2nd Tuesday of the month 1pm-2pm, Journey Room    

## 2020-04-04 NOTE — Progress Notes (Signed)
[facility] [address], [place] [address]   CLINIC:  Medical Oncology/Hematology  PCP:  [name], MD [address] [place] / [place] [address]  [phone]  REASON FOR VISIT:  Follow-up for unprovoked pulmonary embolism  PRIOR THERAPY: None  CURRENT THERAPY: Coumadin daily  INTERVAL HISTORY:  Cheryl Swanson, a 86 y.o. female, returns for routine follow-up for her unprovoked pulmonary embolism. Cheryl Swanson was last seen on 09/28/2019.  Today she reports feeling well. She denies having any recent falls. She takes Coumadin 2.5 mg daily and has check-ups with Dr. Sudie Bailey.  She continues living at home and is doing all of her ADL's. She is doing the cooking primarily with her daughter helping her out occasionally.   REVIEW OF SYSTEMS:  Review of Systems  Constitutional: Positive for appetite change (75%) and fatigue (50%).  Musculoskeletal: Positive for arthralgias (legs pain).  All other systems reviewed and are negative.   PAST MEDICAL/SURGICAL HISTORY:  Past Medical History:  Diagnosis Date  . Glaucoma   . Mitral valve disorder   . Peptic ulcer   . Rheumatoid arthritis(714.0)   . Tricuspid valve disorder    Past Surgical History:  Procedure Laterality Date  . ABDOMINAL HYSTERECTOMY  1971  . HAND SURGERY  1980's   left    SOCIAL HISTORY:  Social History   Socioeconomic History  . Marital status: Widowed    Spouse name: Not on file  . Number of children: Not on file  . Years of education: Not on file  . Highest education level: Not on file  Occupational History  . Not on file  Tobacco Use  . Smoking status: Never Smoker  . Smokeless tobacco: Never Used  Vaping Use  . Vaping Use: Never used  Substance and Sexual Activity  . Alcohol use: No  . Drug use: No  . Sexual activity: Not on file  Other Topics Concern  . Not on file  Social History Narrative  . Not on file   Social Determinants of Health   Financial Resource  Strain:   . Difficulty of Paying Living Expenses: Not on file  Food Insecurity:   . Worried About Programme researcher, broadcasting/film/video in the Last Year: Not on file  . Ran Out of Food in the Last Year: Not on file  Transportation Needs:   . Lack of Transportation (Medical): Not on file  . Lack of Transportation (Non-Medical): Not on file  Physical Activity:   . Days of Exercise per Week: Not on file  . Minutes of Exercise per Session: Not on file  Stress:   . Feeling of Stress : Not on file  Social Connections:   . Frequency of Communication with Friends and Family: Not on file  . Frequency of Social Gatherings with Friends and Family: Not on file  . Attends Religious Services: Not on file  . Active Member of Clubs or Organizations: Not on file  . Attends Banker Meetings: Not on file  . Marital Status: Not on file  Intimate Partner Violence:   . Fear of Current or Ex-Partner: Not on file  . Emotionally Abused: Not on file  . Physically Abused: Not on file  . Sexually Abused: Not on file    FAMILY HISTORY:  Family History  Problem Relation Age of Onset  . Cancer Mother   . Cancer Father   . Cancer Brother   . Cancer Brother   . Cancer Son  prostate-remission    CURRENT MEDICATIONS:  Current Outpatient Medications  Medication Sig Dispense Refill  . albuterol (PROVENTIL HFA;VENTOLIN HFA) 108 (90 Base) MCG/ACT inhaler Inhale 2 puffs into the lungs every 4 (four) hours as needed for wheezing or shortness of breath. 1 Inhaler 3  . calcium-vitamin D (OSCAL WITH D) 500-200 MG-UNIT per tablet Take 1 tablet by mouth 2 (two) times daily.    . Glycerin-Hypromellose-PEG 400 (DRY EYE RELIEF DROPS) 0.2-0.2-1 % SOLN Apply 1 drop to eye in the morning and at bedtime.    Marland Kitchen latanoprost (XALATAN) 0.005 % ophthalmic solution Place 1 drop into both eyes at bedtime.     Marland Kitchen LORazepam (ATIVAN) 0.5 MG tablet Take 0.5-1 mg by mouth at bedtime.    . megestrol (MEGACE) 40 MG/ML suspension Take 400  mg by mouth daily.    . methotrexate (RHEUMATREX) 2.5 MG tablet Take 9 tablets by mouth every Monday.     . simvastatin (ZOCOR) 20 MG tablet Take 20 mg by mouth every evening.     . timolol (BETIMOL) 0.5 % ophthalmic solution Place 1 drop into both eyes 2 (two) times daily.    . timolol (TIMOPTIC) 0.5 % ophthalmic solution     . traMADol (ULTRAM) 50 MG tablet Take 50 mg by mouth 3 (three) times daily as needed for moderate pain or severe pain.     Marland Kitchen warfarin (COUMADIN) 2.5 MG tablet Take 1 tablet (2.5 mg total) by mouth daily at 4 PM. 30 tablet 0  . warfarin (COUMADIN) 5 MG tablet Take by mouth.     No current facility-administered medications for this visit.    ALLERGIES:  No Known Allergies  PHYSICAL EXAM:  Performance status (ECOG): 1 - Symptomatic but completely ambulatory  Vitals:   04/04/20 1458  BP: 135/73  Pulse: 90  Resp: 16  Temp: (!) 97.3 F (36.3 C)  SpO2: 100%   Wt Readings from Last 3 Encounters:  04/04/20 116 lb 12.8 oz (53 kg)  09/28/19 116 lb (52.6 kg)  09/02/19 117 lb (53.1 kg)   Physical Exam Vitals reviewed.  Constitutional:      Appearance: Normal appearance.  Cardiovascular:     Rate and Rhythm: Normal rate and regular rhythm.     Pulses: Normal pulses.     Heart sounds: Normal heart sounds.  Pulmonary:     Effort: Pulmonary effort is normal.     Breath sounds: Normal breath sounds.  Neurological:     General: No focal deficit present.     Mental Status: She is alert and oriented to person, place, and time.  Psychiatric:        Mood and Affect: Mood normal.        Behavior: Behavior normal.     LABORATORY DATA:  I have reviewed the labs as listed.  CBC Latest Ref Rng & Units 03/28/2020 02/18/2020 10/19/2019  WBC 4.0 - 10.5 K/uL 4.3 5.9 5.0  Hemoglobin 12.0 - 15.0 g/dL 08.6 57.8 15.1(H)  Hematocrit 36 - 46 % 44.8 45.8 46.4(H)  Platelets 150 - 400 K/uL 340 392 315   CMP Latest Ref Rng & Units 02/18/2020 10/19/2019 08/17/2019  Glucose 70 - 99  mg/dL 96 91 81  BUN 8 - 23 mg/dL 14 10 11   Creatinine 0.44 - 1.00 mg/dL ) 4.69(G 2.95  Sodium 135 - 145 mmol/L 140 141 139  Potassium 3.5 - 5.1 mmol/L 3.9 3.7 4.0  Chloride 98 - 111 mmol/L 106 108 109  CO2 22 -  32 mmol/L 26 27 21(L)  Calcium 8.9 - 10.3 mg/dL 62.5 9.8 9.5  Total Protein 6.5 - 8.1 g/dL 7.9 7.6 6.5  Total Bilirubin 0.3 - 1.2 mg/dL 0.9 1.2 6.3(S)  Alkaline Phos 38 - 126 U/L 45 47 41  AST 15 - 41 U/L 20 21 17   ALT 0 - 44 U/L 10 11 8       Component Value Date/Time   RBC 4.56 03/28/2020 1404   MCV 98.2 03/28/2020 1404   MCH 31.8 03/28/2020 1404   MCHC 32.4 03/28/2020 1404   RDW 14.6 03/28/2020 1404   LYMPHSABS 1.3 03/28/2020 1404   MONOABS 0.9 03/28/2020 1404   EOSABS 0.3 03/28/2020 1404   BASOSABS 0.0 03/28/2020 1404    DIAGNOSTIC IMAGING:  I have independently reviewed the scans and discussed with the patient. ECHOCARDIOGRAM COMPLETE  Result Date: 03/13/2020    ECHOCARDIOGRAM REPORT   Patient Name:   Cheryl Swanson Date of Exam: 03/13/2020 Medical Rec #:  [id number]      Height:       67.0 in Accession #:    [id number]     Weight:       116.0 lb Date of Birth:  [birth year]       BSA:          1.604 m Patient Age:    88 years       BP:           150/75 mmHg Patient Gender: F              HR:           87 bpm. [facility] Procedure: 2D Echo, Cardiac Doppler and Color Doppler Indications:    I50.32 (ICD-10-CM) - Chronic diastolic heart failure  History:        Patient has prior history of Echocardiogram examinations, most                 recent 08/17/2019. Signs/Symptoms:Dyspnea. LE edema, Pulmonary                 embolism.  Sonographer:    Celesta Gentile RCS Referring Phys: [phone] STEVE KNOWLTON IMPRESSIONS  1. Left ventricular ejection fraction, by estimation, is 65 to 70%. The left ventricle has normal function. The left ventricle has no regional wall motion abnormalities. Left ventricular diastolic parameters are consistent with Grade I diastolic dysfunction  (impaired relaxation).  2. Right ventricular systolic function is normal. The right ventricular size is normal.  3. The mitral valve is normal in structure. No evidence of mitral valve regurgitation. No evidence of mitral stenosis.  4. The aortic valve is tricuspid. There is mild calcification of the aortic valve. There is mild thickening of the aortic valve. Aortic valve regurgitation is not visualized. No aortic stenosis is present.  5. The inferior vena cava is normal in size with greater than 50% respiratory variability, suggesting right atrial pressure of 3 mmHg. FINDINGS  Left Ventricle: Left ventricular ejection fraction, by estimation, is 65 to 70%. The left ventricle has normal function. The left ventricle has no regional wall motion abnormalities. The left ventricular internal cavity size was normal in size. There is  no left ventricular hypertrophy. Left ventricular diastolic parameters are consistent with Grade I diastolic dysfunction (impaired relaxation). Normal left ventricular filling pressure. Right Ventricle: The right ventricular size is normal. No increase in right ventricular wall thickness. Right ventricular systolic function is normal. Left Atrium: Left atrial size was normal in  size. Right Atrium: Right atrial size was normal in size. Pericardium: There is no evidence of pericardial effusion. Mitral Valve: The mitral valve is normal in structure. No evidence of mitral valve regurgitation. No evidence of mitral valve stenosis. Tricuspid Valve: The tricuspid valve is normal in structure. Tricuspid valve regurgitation is trivial. No evidence of tricuspid stenosis. Aortic Valve: The aortic valve is tricuspid. There is mild calcification of the aortic valve. There is mild thickening of the aortic valve. There is mild aortic valve annular calcification. Aortic valve regurgitation is not visualized. No aortic stenosis  is present. Aortic valve mean gradient measures 3.7 mmHg. Aortic valve peak  gradient measures 7.7 mmHg. Aortic valve area, by VTI measures 1.70 cm. Pulmonic Valve: The pulmonic valve was not well visualized. Pulmonic valve regurgitation is not visualized. No evidence of pulmonic stenosis. Aorta: The aortic root is normal in size and structure. Venous: The inferior vena cava is normal in size with greater than 50% respiratory variability, suggesting right atrial pressure of 3 mmHg. IAS/Shunts: No atrial level shunt detected by color flow Doppler.  LEFT VENTRICLE PLAX 2D LVIDd:         3.78 cm  Diastology LVIDs:         2.29 cm  LV e' medial:    9.68 cm/s LV PW:         0.82 cm  LV E/e' medial:  12.6 LV IVS:        0.89 cm  LV e' lateral:   9.67 cm/s LVOT diam:     1.50 cm  LV E/e' lateral: 12.6 LV SV:         40 LV SV Index:   25 LVOT Area:     1.77 cm  RIGHT VENTRICLE RV S prime:     12.30 cm/s TAPSE (M-mode): 1.9 cm LEFT ATRIUM             Index       RIGHT ATRIUM          Index LA diam:        2.20 cm 1.37 cm/m  RA Area:     8.70 cm LA Vol (A2C):   46.6 ml 29.05 ml/m RA Volume:   16.95 ml 10.57 ml/m LA Vol (A4C):   46.7 ml 29.11 ml/m LA Biplane Vol: 47.0 ml 29.30 ml/m  AORTIC VALVE AV Area (Vmax):    1.31 cm AV Area (Vmean):   1.15 cm AV Area (VTI):     1.70 cm AV Vmax:           138.76 cm/s AV Vmean:          88.520 cm/s AV VTI:            0.234 m AV Peak Grad:      7.7 mmHg AV Mean Grad:      3.7 mmHg LVOT Vmax:         103.00 cm/s LVOT Vmean:        57.800 cm/s LVOT VTI:          0.225 m LVOT/AV VTI ratio: 0.96  AORTA Ao Root diam: 2.60 cm MITRAL VALVE MV Area (PHT): 1.68 cm     SHUNTS MV Decel Time: 452 msec     Systemic VTI:  0.22 m MV E velocity: 122.00 cm/s  Systemic Diam: 1.50 cm MV A velocity: 146.00 cm/s MV E/A ratio:  0.84 Dina Rich MD Electronically signed by Dina Rich MD Signature Date/Time: 03/13/2020/4:12:35 PM  Final      ASSESSMENT:  1.  Unprovoked recurrent DVT and PE: -First episode of unprovoked DVT on 02/03/2019, received Coumadin for 3  and half months, discontinued on 05/21/2019. -Unprovoked pulmonary embolism on 08/16/2019 with small acute nonocclusive segmental pulmonary emboli in the left lower lobe, right middle lobe and both lower lobes. -Ultrasound of the lower extremities on 08/17/2019 did not show any evidence of DVT. -Even though she is physically limited by her rheumatoid arthritis, she is mobile and lives independently and does ADLs and IADLs. -She is tolerating Coumadin very well.  Last INR was 2.0. -Lupus anticoagulant, anticardiolipin antibody, antibeta-2 glycoprotein 1 antibody, factor V Leiden and prothrombin gene mutation were negative. -She was recommended indefinite anticoagulation at this time.   2.  Rheumatoid arthritis: -It mainly affects her hands.  She is on methotrexate for many years.   PLAN:  1.  Unprovoked recurrent DVT and PE: -She is tolerating Coumadin very well.  She is taking half tablet of Coumadin 5 mg. -Dr. Sudie Bailey is checking her INR. -We reviewed her labs from 03/28/2020 which showed elevated D-dimer 0.65.  CBC was normal. -She has not reported any falls since last visit.  She is continuing to be living independently.  Her daughter helps with some activities at home. -At this time benefits outweigh risks of continuing anticoagulation. -She will be reevaluated in 1 year.  2.  Rheumatoid arthritis: -Continue methotrexate.  Orders placed this encounter:  No orders of the defined types were placed in this encounter.    Doreatha Massed, MD [facility] [phone]   I, Drue Second, am acting as a scribe for Dr. Payton Mccallum.  I, Doreatha Massed MD, have reviewed the above documentation for accuracy and completeness, and I agree with the above.

## 2020-04-06 ENCOUNTER — Ambulatory Visit (HOSPITAL_COMMUNITY)
Admission: RE | Admit: 2020-04-06 | Discharge: 2020-04-06 | Disposition: A | Discharge status: Home / Self Care | Payer: Medicare Other | Source: Ambulatory Visit | Attending: Family Medicine | Admitting: Family Medicine

## 2020-04-06 ENCOUNTER — Other Ambulatory Visit (HOSPITAL_COMMUNITY): Payer: Self-pay | Admitting: Family Medicine

## 2020-04-06 ENCOUNTER — Other Ambulatory Visit: Payer: Self-pay

## 2020-04-06 DIAGNOSIS — M545 Low back pain, unspecified: Secondary | ICD-10-CM

## 2020-04-06 DIAGNOSIS — R29898 Other symptoms and signs involving the musculoskeletal system: Secondary | ICD-10-CM | POA: Insufficient documentation

## 2020-04-20 ENCOUNTER — Other Ambulatory Visit: Payer: Self-pay

## 2020-04-20 ENCOUNTER — Encounter (HOSPITAL_COMMUNITY)
Admission: RE | Admit: 2020-04-20 | Discharge: 2020-04-20 | Disposition: A | Discharge status: Home / Self Care | Payer: Medicare Other | Source: Ambulatory Visit | Attending: Family Medicine | Admitting: Family Medicine

## 2020-04-20 DIAGNOSIS — Z7901 Long term (current) use of anticoagulants: Secondary | ICD-10-CM | POA: Diagnosis not present

## 2020-04-20 LAB — PROTIME-INR
INR: 1.8 — ABNORMAL HIGH (ref 0.8–1.2)
Prothrombin Time: 20.2 seconds — ABNORMAL HIGH (ref 11.4–15.2)

## 2020-05-05 ENCOUNTER — Other Ambulatory Visit (HOSPITAL_COMMUNITY): Payer: Self-pay | Admitting: Family Medicine

## 2020-05-05 ENCOUNTER — Other Ambulatory Visit: Payer: Self-pay | Admitting: Family Medicine

## 2020-05-05 DIAGNOSIS — M5431 Sciatica, right side: Secondary | ICD-10-CM

## 2020-05-05 DIAGNOSIS — M5432 Sciatica, left side: Secondary | ICD-10-CM

## 2020-05-08 ENCOUNTER — Other Ambulatory Visit: Payer: Self-pay

## 2020-05-08 ENCOUNTER — Encounter (HOSPITAL_COMMUNITY)
Admission: RE | Admit: 2020-05-08 | Discharge: 2020-05-08 | Disposition: A | Discharge status: Home / Self Care | Payer: Medicare Other | Source: Ambulatory Visit | Attending: Family Medicine | Admitting: Family Medicine

## 2020-05-08 DIAGNOSIS — Z7901 Long term (current) use of anticoagulants: Secondary | ICD-10-CM | POA: Diagnosis not present

## 2020-05-08 LAB — PROTIME-INR
INR: 1.6 — ABNORMAL HIGH (ref 0.8–1.2)
Prothrombin Time: 18.8 seconds — ABNORMAL HIGH (ref 11.4–15.2)

## 2020-05-18 ENCOUNTER — Ambulatory Visit (HOSPITAL_COMMUNITY)
Admission: RE | Admit: 2020-05-18 | Discharge: 2020-05-18 | Disposition: A | Discharge status: Home / Self Care | Payer: Medicare Other | Source: Ambulatory Visit | Attending: Family Medicine | Admitting: Family Medicine

## 2020-05-18 ENCOUNTER — Other Ambulatory Visit: Payer: Self-pay

## 2020-05-18 DIAGNOSIS — M5431 Sciatica, right side: Secondary | ICD-10-CM | POA: Diagnosis not present

## 2020-05-18 DIAGNOSIS — M5432 Sciatica, left side: Secondary | ICD-10-CM | POA: Insufficient documentation

## 2020-05-23 ENCOUNTER — Other Ambulatory Visit: Payer: Self-pay

## 2020-05-23 ENCOUNTER — Encounter (HOSPITAL_COMMUNITY)
Admission: RE | Admit: 2020-05-23 | Discharge: 2020-05-23 | Disposition: A | Discharge status: Home / Self Care | Payer: Medicare Other | Source: Ambulatory Visit | Attending: Family Medicine | Admitting: Family Medicine

## 2020-05-23 DIAGNOSIS — Z7901 Long term (current) use of anticoagulants: Secondary | ICD-10-CM | POA: Diagnosis not present

## 2020-05-23 LAB — PROTIME-INR
INR: 4.8 (ref 0.8–1.2)
Prothrombin Time: 43.6 seconds — ABNORMAL HIGH (ref 11.4–15.2)

## 2020-05-26 ENCOUNTER — Encounter (HOSPITAL_COMMUNITY)
Admission: RE | Admit: 2020-05-26 | Discharge: 2020-05-26 | Disposition: A | Discharge status: Home / Self Care | Payer: Medicare Other | Source: Ambulatory Visit | Attending: Family Medicine | Admitting: Family Medicine

## 2020-05-26 ENCOUNTER — Other Ambulatory Visit: Payer: Self-pay

## 2020-05-26 DIAGNOSIS — Z7901 Long term (current) use of anticoagulants: Secondary | ICD-10-CM | POA: Diagnosis not present

## 2020-05-26 LAB — PROTIME-INR
INR: 3.3 — ABNORMAL HIGH (ref 0.8–1.2)
Prothrombin Time: 32.7 seconds — ABNORMAL HIGH (ref 11.4–15.2)

## 2020-06-07 ENCOUNTER — Other Ambulatory Visit: Payer: Self-pay

## 2020-06-07 ENCOUNTER — Encounter (HOSPITAL_COMMUNITY)
Admission: RE | Admit: 2020-06-07 | Discharge: 2020-06-07 | Disposition: A | Discharge status: Home / Self Care | Payer: Medicare Other | Source: Ambulatory Visit | Attending: Family Medicine | Admitting: Family Medicine

## 2020-06-07 DIAGNOSIS — Z7901 Long term (current) use of anticoagulants: Secondary | ICD-10-CM | POA: Diagnosis not present

## 2020-06-07 LAB — PROTIME-INR
INR: 1.4 — ABNORMAL HIGH (ref 0.8–1.2)
Prothrombin Time: 16.3 seconds — ABNORMAL HIGH (ref 11.4–15.2)

## 2020-07-05 ENCOUNTER — Other Ambulatory Visit (HOSPITAL_COMMUNITY)
Admission: RE | Admit: 2020-07-05 | Discharge: 2020-07-05 | Disposition: A | Discharge status: Home / Self Care | Payer: Medicare Other | Source: Ambulatory Visit | Attending: Family Medicine | Admitting: Family Medicine

## 2020-07-05 DIAGNOSIS — Z7901 Long term (current) use of anticoagulants: Secondary | ICD-10-CM | POA: Insufficient documentation

## 2020-07-05 LAB — PROTIME-INR
INR: 2.6 — ABNORMAL HIGH (ref 0.8–1.2)
Prothrombin Time: 26.6 seconds — ABNORMAL HIGH (ref 11.4–15.2)

## 2020-08-10 ENCOUNTER — Other Ambulatory Visit: Payer: Self-pay

## 2020-08-10 ENCOUNTER — Encounter (HOSPITAL_COMMUNITY)
Admission: RE | Admit: 2020-08-10 | Discharge: 2020-08-10 | Disposition: A | Discharge status: Home / Self Care | Payer: Medicare Other | Source: Ambulatory Visit | Attending: Family Medicine | Admitting: Family Medicine

## 2020-08-10 DIAGNOSIS — Z7901 Long term (current) use of anticoagulants: Secondary | ICD-10-CM | POA: Diagnosis not present

## 2020-08-10 LAB — PROTIME-INR
INR: 2.2 — ABNORMAL HIGH (ref 0.8–1.2)
Prothrombin Time: 23.6 seconds — ABNORMAL HIGH (ref 11.4–15.2)

## 2020-08-28 ENCOUNTER — Other Ambulatory Visit: Payer: Self-pay

## 2020-08-28 ENCOUNTER — Encounter (HOSPITAL_COMMUNITY)
Admission: RE | Admit: 2020-08-28 | Discharge: 2020-08-28 | Disposition: A | Discharge status: Home / Self Care | Payer: Medicare Other | Source: Ambulatory Visit | Attending: Family Medicine | Admitting: Family Medicine

## 2020-08-28 DIAGNOSIS — Z7901 Long term (current) use of anticoagulants: Secondary | ICD-10-CM | POA: Insufficient documentation

## 2020-08-28 LAB — PROTIME-INR
INR: 2 — ABNORMAL HIGH (ref 0.8–1.2)
Prothrombin Time: 22.1 seconds — ABNORMAL HIGH (ref 11.4–15.2)

## 2020-09-18 ENCOUNTER — Other Ambulatory Visit (HOSPITAL_COMMUNITY): Payer: Self-pay | Admitting: Family Medicine

## 2020-09-18 DIAGNOSIS — M069 Rheumatoid arthritis, unspecified: Secondary | ICD-10-CM

## 2020-09-19 ENCOUNTER — Ambulatory Visit (HOSPITAL_COMMUNITY)
Admission: RE | Admit: 2020-09-19 | Discharge: 2020-09-19 | Disposition: A | Discharge status: Home / Self Care | Payer: Medicare Other | Source: Ambulatory Visit | Attending: Family Medicine | Admitting: Family Medicine

## 2020-09-19 ENCOUNTER — Other Ambulatory Visit: Payer: Self-pay

## 2020-09-19 ENCOUNTER — Other Ambulatory Visit (HOSPITAL_COMMUNITY)
Admission: RE | Admit: 2020-09-19 | Discharge: 2020-09-19 | Disposition: A | Discharge status: Home / Self Care | Payer: Medicare Other | Source: Ambulatory Visit | Attending: Family Medicine | Admitting: Family Medicine

## 2020-09-19 ENCOUNTER — Other Ambulatory Visit (HOSPITAL_COMMUNITY): Payer: Self-pay | Admitting: Family Medicine

## 2020-09-19 DIAGNOSIS — M069 Rheumatoid arthritis, unspecified: Secondary | ICD-10-CM | POA: Insufficient documentation

## 2020-09-19 DIAGNOSIS — M25561 Pain in right knee: Secondary | ICD-10-CM | POA: Insufficient documentation

## 2020-09-19 LAB — CBC WITH DIFFERENTIAL/PLATELET
Abs Immature Granulocytes: 0.01 10*3/uL (ref 0.00–0.07)
Basophils Absolute: 0 10*3/uL (ref 0.0–0.1)
Basophils Relative: 1 %
Eosinophils Absolute: 0 10*3/uL (ref 0.0–0.5)
Eosinophils Relative: 1 %
HCT: 45.8 % (ref 36.0–46.0)
Hemoglobin: 14.7 g/dL (ref 12.0–15.0)
Immature Granulocytes: 0 %
Lymphocytes Relative: 15 %
Lymphs Abs: 0.6 10*3/uL — ABNORMAL LOW (ref 0.7–4.0)
MCH: 31.4 pg (ref 26.0–34.0)
MCHC: 32.1 g/dL (ref 30.0–36.0)
MCV: 97.9 fL (ref 80.0–100.0)
Monocytes Absolute: 0.1 10*3/uL (ref 0.1–1.0)
Monocytes Relative: 2 %
Neutro Abs: 3.3 10*3/uL (ref 1.7–7.7)
Neutrophils Relative %: 81 %
Platelets: 328 10*3/uL (ref 150–400)
RBC: 4.68 MIL/uL (ref 3.87–5.11)
RDW: 14.7 % (ref 11.5–15.5)
WBC: 4 10*3/uL (ref 4.0–10.5)
nRBC: 0 % (ref 0.0–0.2)

## 2020-09-19 LAB — COMPREHENSIVE METABOLIC PANEL
ALT: 11 U/L (ref 0–44)
AST: 23 U/L (ref 15–41)
Albumin: 3.8 g/dL (ref 3.5–5.0)
Alkaline Phosphatase: 66 U/L (ref 38–126)
Anion gap: 7 (ref 5–15)
BUN: 12 mg/dL (ref 8–23)
CO2: 28 mmol/L (ref 22–32)
Calcium: 10 mg/dL (ref 8.9–10.3)
Chloride: 103 mmol/L (ref 98–111)
Creatinine, Ser: 0.65 mg/dL (ref 0.44–1.00)
GFR, Estimated: >60 mL/min (ref >60)
Glucose, Bld: 102 mg/dL — ABNORMAL HIGH (ref 70–99)
Potassium: 3.8 mmol/L (ref 3.5–5.1)
Sodium: 138 mmol/L (ref 135–145)
Total Bilirubin: 0.9 mg/dL (ref 0.3–1.2)
Total Protein: 7.6 g/dL (ref 6.5–8.1)

## 2020-09-19 LAB — SEDIMENTATION RATE: Sed Rate: 45 mm/hr — ABNORMAL HIGH (ref 0–22)

## 2020-09-19 LAB — C-REACTIVE PROTEIN: CRP: 0.6 mg/dL (ref <1.0)

## 2020-09-27 ENCOUNTER — Encounter (HOSPITAL_COMMUNITY)
Admission: RE | Admit: 2020-09-27 | Discharge: 2020-09-27 | Disposition: A | Discharge status: Home / Self Care | Payer: Medicare Other | Source: Ambulatory Visit | Attending: Family Medicine | Admitting: Family Medicine

## 2020-09-27 DIAGNOSIS — Z7901 Long term (current) use of anticoagulants: Secondary | ICD-10-CM | POA: Diagnosis present

## 2020-09-27 LAB — PROTIME-INR
INR: 3.2 — ABNORMAL HIGH (ref 0.8–1.2)
Prothrombin Time: 32.8 seconds — ABNORMAL HIGH (ref 11.4–15.2)

## 2020-10-27 ENCOUNTER — Other Ambulatory Visit: Payer: Self-pay

## 2020-10-27 ENCOUNTER — Other Ambulatory Visit (HOSPITAL_COMMUNITY)
Admission: RE | Admit: 2020-10-27 | Discharge: 2020-10-27 | Disposition: A | Discharge status: Home / Self Care | Payer: Medicare Other | Source: Ambulatory Visit | Attending: Family Medicine | Admitting: Family Medicine

## 2020-10-27 DIAGNOSIS — Z7901 Long term (current) use of anticoagulants: Secondary | ICD-10-CM | POA: Insufficient documentation

## 2020-10-27 LAB — PROTIME-INR
INR: 2.5 — ABNORMAL HIGH (ref 0.8–1.2)
Prothrombin Time: 26.7 seconds — ABNORMAL HIGH (ref 11.4–15.2)

## 2020-12-14 ENCOUNTER — Other Ambulatory Visit (HOSPITAL_COMMUNITY)
Admission: RE | Admit: 2020-12-14 | Discharge: 2020-12-14 | Disposition: A | Discharge status: Home / Self Care | Payer: Medicare Other | Source: Ambulatory Visit | Attending: Family Medicine | Admitting: Family Medicine

## 2020-12-14 DIAGNOSIS — Z7901 Long term (current) use of anticoagulants: Secondary | ICD-10-CM | POA: Diagnosis present

## 2020-12-14 LAB — PROTIME-INR
INR: 1.4 — ABNORMAL HIGH (ref 0.8–1.2)
Prothrombin Time: 17.2 seconds — ABNORMAL HIGH (ref 11.4–15.2)

## 2021-01-04 ENCOUNTER — Other Ambulatory Visit (HOSPITAL_COMMUNITY)
Admission: RE | Admit: 2021-01-04 | Discharge: 2021-01-04 | Disposition: A | Discharge status: Home / Self Care | Payer: Medicare Other | Source: Ambulatory Visit | Attending: Family Medicine | Admitting: Family Medicine

## 2021-01-04 ENCOUNTER — Other Ambulatory Visit: Payer: Self-pay

## 2021-01-04 DIAGNOSIS — Z7901 Long term (current) use of anticoagulants: Secondary | ICD-10-CM | POA: Diagnosis present

## 2021-01-04 LAB — PROTIME-INR
INR: 1.8 — ABNORMAL HIGH (ref 0.8–1.2)
Prothrombin Time: 21.3 seconds — ABNORMAL HIGH (ref 11.4–15.2)

## 2021-02-05 ENCOUNTER — Other Ambulatory Visit (HOSPITAL_COMMUNITY)
Admission: RE | Admit: 2021-02-05 | Discharge: 2021-02-05 | Disposition: A | Discharge status: Home / Self Care | Payer: Medicare Other | Source: Ambulatory Visit | Attending: Family Medicine | Admitting: Family Medicine

## 2021-02-05 ENCOUNTER — Other Ambulatory Visit: Payer: Self-pay

## 2021-02-05 DIAGNOSIS — Z7901 Long term (current) use of anticoagulants: Secondary | ICD-10-CM | POA: Insufficient documentation

## 2021-02-05 LAB — PROTIME-INR
INR: 1.6 — ABNORMAL HIGH (ref 0.8–1.2)
Prothrombin Time: 18.6 seconds — ABNORMAL HIGH (ref 11.4–15.2)

## 2021-02-26 ENCOUNTER — Other Ambulatory Visit (HOSPITAL_COMMUNITY)
Admission: RE | Admit: 2021-02-26 | Discharge: 2021-02-26 | Disposition: A | Discharge status: Home / Self Care | Payer: Medicare Other | Source: Ambulatory Visit | Attending: Family Medicine | Admitting: Family Medicine

## 2021-02-26 DIAGNOSIS — Z7901 Long term (current) use of anticoagulants: Secondary | ICD-10-CM | POA: Insufficient documentation

## 2021-02-26 DIAGNOSIS — R5381 Other malaise: Secondary | ICD-10-CM | POA: Insufficient documentation

## 2021-02-26 DIAGNOSIS — E7800 Pure hypercholesterolemia, unspecified: Secondary | ICD-10-CM | POA: Diagnosis present

## 2021-02-26 DIAGNOSIS — E559 Vitamin D deficiency, unspecified: Secondary | ICD-10-CM | POA: Insufficient documentation

## 2021-02-26 DIAGNOSIS — Z79899 Other long term (current) drug therapy: Secondary | ICD-10-CM | POA: Insufficient documentation

## 2021-02-26 DIAGNOSIS — M069 Rheumatoid arthritis, unspecified: Secondary | ICD-10-CM | POA: Diagnosis present

## 2021-02-26 LAB — CBC
HCT: 45.2 % (ref 36.0–46.0)
Hemoglobin: 15.1 g/dL — ABNORMAL HIGH (ref 12.0–15.0)
MCH: 33 pg (ref 26.0–34.0)
MCHC: 33.4 g/dL (ref 30.0–36.0)
MCV: 98.9 fL (ref 80.0–100.0)
Platelets: 249 10*3/uL (ref 150–400)
RBC: 4.57 MIL/uL (ref 3.87–5.11)
RDW: 14.7 % (ref 11.5–15.5)
WBC: 3.7 10*3/uL — ABNORMAL LOW (ref 4.0–10.5)
nRBC: 0 % (ref 0.0–0.2)

## 2021-02-26 LAB — LIPID PANEL
Cholesterol: 194 mg/dL (ref 0–200)
HDL: 72 mg/dL (ref >40)
LDL Cholesterol: 110 mg/dL — ABNORMAL HIGH (ref 0–99)
Total CHOL/HDL Ratio: 2.7 RATIO
Triglycerides: 61 mg/dL (ref <150)
VLDL: 12 mg/dL (ref 0–40)

## 2021-02-26 LAB — COMPREHENSIVE METABOLIC PANEL
ALT: 12 U/L (ref 0–44)
AST: 19 U/L (ref 15–41)
Albumin: 3.8 g/dL (ref 3.5–5.0)
Alkaline Phosphatase: 59 U/L (ref 38–126)
Anion gap: 5 (ref 5–15)
BUN: 14 mg/dL (ref 8–23)
CO2: 28 mmol/L (ref 22–32)
Calcium: 9.6 mg/dL (ref 8.9–10.3)
Chloride: 105 mmol/L (ref 98–111)
Creatinine, Ser: 0.82 mg/dL (ref 0.44–1.00)
GFR, Estimated: >60 mL/min (ref >60)
Glucose, Bld: 94 mg/dL (ref 70–99)
Potassium: 3.8 mmol/L (ref 3.5–5.1)
Sodium: 138 mmol/L (ref 135–145)
Total Bilirubin: 1.1 mg/dL (ref 0.3–1.2)
Total Protein: 7.2 g/dL (ref 6.5–8.1)

## 2021-02-26 LAB — TSH: TSH: 2.122 u[IU]/mL (ref 0.350–4.500)

## 2021-02-26 LAB — VITAMIN B12: Vitamin B-12: 878 pg/mL (ref 180–914)

## 2021-02-26 LAB — PROTIME-INR
INR: 2.1 — ABNORMAL HIGH (ref 0.8–1.2)
Prothrombin Time: 23.9 seconds — ABNORMAL HIGH (ref 11.4–15.2)

## 2021-02-26 LAB — SEDIMENTATION RATE: Sed Rate: 16 mm/hr (ref 0–22)

## 2021-04-02 ENCOUNTER — Other Ambulatory Visit (HOSPITAL_COMMUNITY)
Admission: RE | Admit: 2021-04-02 | Discharge: 2021-04-02 | Disposition: A | Discharge status: Home / Self Care | Payer: Medicare Other | Source: Ambulatory Visit | Attending: Family Medicine | Admitting: Family Medicine

## 2021-04-02 ENCOUNTER — Other Ambulatory Visit: Payer: Self-pay

## 2021-04-02 DIAGNOSIS — Z7901 Long term (current) use of anticoagulants: Secondary | ICD-10-CM | POA: Insufficient documentation

## 2021-04-02 LAB — PROTIME-INR
INR: 1.7 — ABNORMAL HIGH (ref 0.8–1.2)
Prothrombin Time: 20.1 seconds — ABNORMAL HIGH (ref 11.4–15.2)

## 2021-04-16 ENCOUNTER — Other Ambulatory Visit (HOSPITAL_COMMUNITY): Payer: Self-pay

## 2021-04-16 DIAGNOSIS — I2694 Multiple subsegmental pulmonary emboli without acute cor pulmonale: Secondary | ICD-10-CM

## 2021-04-17 ENCOUNTER — Inpatient Hospital Stay (HOSPITAL_COMMUNITY): Payer: Medicare Other | Attending: Hematology

## 2021-04-24 ENCOUNTER — Ambulatory Visit (HOSPITAL_COMMUNITY): Payer: Medicare Other | Admitting: Hematology

## 2021-04-25 ENCOUNTER — Other Ambulatory Visit (HOSPITAL_COMMUNITY): Payer: Self-pay | Admitting: Nurse Practitioner

## 2021-04-25 DIAGNOSIS — Z780 Asymptomatic menopausal state: Secondary | ICD-10-CM

## 2021-04-30 ENCOUNTER — Ambulatory Visit (HOSPITAL_COMMUNITY)
Admission: RE | Admit: 2021-04-30 | Discharge: 2021-04-30 | Disposition: A | Discharge status: Home / Self Care | Payer: Medicare Other | Source: Ambulatory Visit | Attending: Nurse Practitioner | Admitting: Nurse Practitioner

## 2021-04-30 ENCOUNTER — Other Ambulatory Visit: Payer: Self-pay

## 2021-04-30 DIAGNOSIS — Z780 Asymptomatic menopausal state: Secondary | ICD-10-CM | POA: Diagnosis present

## 2021-05-29 ENCOUNTER — Other Ambulatory Visit (HOSPITAL_COMMUNITY): Payer: Self-pay | Admitting: Family Medicine

## 2021-05-29 ENCOUNTER — Encounter (HOSPITAL_COMMUNITY)
Admission: RE | Admit: 2021-05-29 | Discharge: 2021-05-29 | Disposition: A | Discharge status: Home / Self Care | Payer: Medicare Other | Source: Ambulatory Visit | Attending: Family Medicine | Admitting: Family Medicine

## 2021-05-29 ENCOUNTER — Other Ambulatory Visit: Payer: Self-pay

## 2021-05-29 DIAGNOSIS — Z5181 Encounter for therapeutic drug level monitoring: Secondary | ICD-10-CM | POA: Insufficient documentation

## 2021-05-29 DIAGNOSIS — M069 Rheumatoid arthritis, unspecified: Secondary | ICD-10-CM

## 2021-05-29 DIAGNOSIS — M6281 Muscle weakness (generalized): Secondary | ICD-10-CM

## 2021-05-29 DIAGNOSIS — Z7901 Long term (current) use of anticoagulants: Secondary | ICD-10-CM | POA: Diagnosis present

## 2021-05-29 LAB — PROTIME-INR
INR: 4.1 (ref 0.8–1.2)
Prothrombin Time: 39.7 seconds — ABNORMAL HIGH (ref 11.4–15.2)

## 2021-06-05 ENCOUNTER — Ambulatory Visit (HOSPITAL_COMMUNITY)
Admission: RE | Admit: 2021-06-05 | Discharge: 2021-06-05 | Disposition: A | Discharge status: Home / Self Care | Payer: Medicare Other | Source: Ambulatory Visit | Attending: Family Medicine | Admitting: Family Medicine

## 2021-06-05 ENCOUNTER — Other Ambulatory Visit: Payer: Self-pay

## 2021-06-05 DIAGNOSIS — M069 Rheumatoid arthritis, unspecified: Secondary | ICD-10-CM | POA: Insufficient documentation

## 2021-06-05 DIAGNOSIS — M6281 Muscle weakness (generalized): Secondary | ICD-10-CM | POA: Diagnosis present

## 2021-06-12 ENCOUNTER — Encounter (HOSPITAL_COMMUNITY)
Admission: RE | Admit: 2021-06-12 | Discharge: 2021-06-12 | Disposition: A | Discharge status: Home / Self Care | Payer: Medicare Other | Source: Ambulatory Visit | Attending: Family Medicine | Admitting: Family Medicine

## 2021-06-12 DIAGNOSIS — Z5181 Encounter for therapeutic drug level monitoring: Secondary | ICD-10-CM | POA: Diagnosis not present

## 2021-06-12 LAB — PROTIME-INR
INR: 1.1 (ref 0.8–1.2)
Prothrombin Time: 14.6 seconds (ref 11.4–15.2)

## 2021-06-26 ENCOUNTER — Encounter (HOSPITAL_COMMUNITY)
Admission: RE | Admit: 2021-06-26 | Discharge: 2021-06-26 | Disposition: A | Discharge status: Home / Self Care | Payer: Medicare Other | Source: Ambulatory Visit | Attending: Family Medicine | Admitting: Family Medicine

## 2021-06-26 ENCOUNTER — Other Ambulatory Visit: Payer: Self-pay

## 2021-06-26 DIAGNOSIS — Z7901 Long term (current) use of anticoagulants: Secondary | ICD-10-CM | POA: Insufficient documentation

## 2021-06-26 LAB — PROTIME-INR
INR: 1.3 — ABNORMAL HIGH (ref 0.8–1.2)
Prothrombin Time: 16.4 seconds — ABNORMAL HIGH (ref 11.4–15.2)

## 2021-07-10 ENCOUNTER — Encounter (HOSPITAL_COMMUNITY)
Admission: RE | Admit: 2021-07-10 | Discharge: 2021-07-10 | Disposition: A | Discharge status: Home / Self Care | Payer: Medicare Other | Source: Ambulatory Visit | Attending: Family Medicine | Admitting: Family Medicine

## 2021-07-10 DIAGNOSIS — Z7901 Long term (current) use of anticoagulants: Secondary | ICD-10-CM | POA: Diagnosis not present

## 2021-07-10 LAB — PROTIME-INR
INR: 2.4 — ABNORMAL HIGH (ref 0.8–1.2)
Prothrombin Time: 26 seconds — ABNORMAL HIGH (ref 11.4–15.2)

## 2021-08-07 ENCOUNTER — Encounter (HOSPITAL_COMMUNITY)
Admission: RE | Admit: 2021-08-07 | Discharge: 2021-08-07 | Disposition: A | Discharge status: Home / Self Care | Payer: Medicare Other | Source: Ambulatory Visit | Attending: Family Medicine | Admitting: Family Medicine

## 2021-08-07 DIAGNOSIS — Z7901 Long term (current) use of anticoagulants: Secondary | ICD-10-CM | POA: Diagnosis present

## 2021-08-07 LAB — PROTIME-INR
INR: 2.4 — ABNORMAL HIGH (ref 0.8–1.2)
Prothrombin Time: 26.5 seconds — ABNORMAL HIGH (ref 11.4–15.2)

## 2021-09-04 ENCOUNTER — Encounter (INDEPENDENT_AMBULATORY_CARE_PROVIDER_SITE_OTHER): Payer: Self-pay

## 2021-09-04 ENCOUNTER — Ambulatory Visit (HOSPITAL_COMMUNITY): Payer: Medicare Other | Attending: Family Medicine

## 2021-09-04 DIAGNOSIS — M057A Rheumatoid arthritis with rheumatoid factor of other specified site without organ or systems involvement: Secondary | ICD-10-CM | POA: Insufficient documentation

## 2021-09-04 NOTE — Therapy (Signed)
[place] ?[place] ?[address] ?[place], [place], [address] ?Phone: [phone]   Fax:  [phone] ? ?Patient Details  ?Name: Cheryl Swanson ?MRN: [id number] ?Date of Birth: Jul 27, 1931 ?Referring Provider:  Lemmie Evens, MD ? ?Encounter Date: 09/04/2021 ?3:22PM-3:40PM ? ?Patient arrived for OT evaluation. Upon arrival, patient reports that she is here for her RA and experiencing increased pain in bilateral legs, knees, and feet. No deficits related to OT were expressed. Patient referral may have been sent over for OT when PT is needed for evaluation and treatment. Explained issue with patient and let her know that we will send a new order to Dr. Karie Kirks and get the correct referral in our system prior to calling her to schedule a new evaluation for Physical Therapy. Pt verbalized understanding. ? ?Ailene Ravel, OTR/L,CBIS  ?[phone] ? ?09/04/2021, 3:44 PM ? ?[place] [facility] ?[address] ?[place], [place], [address] ?Phone: [phone]   Fax:  [phone] ?

## 2021-09-11 ENCOUNTER — Other Ambulatory Visit (HOSPITAL_COMMUNITY)
Admission: RE | Admit: 2021-09-11 | Discharge: 2021-09-11 | Disposition: A | Discharge status: Home / Self Care | Payer: Medicare Other | Source: Ambulatory Visit | Attending: Family Medicine | Admitting: Family Medicine

## 2021-09-11 DIAGNOSIS — Z7901 Long term (current) use of anticoagulants: Secondary | ICD-10-CM | POA: Diagnosis present

## 2021-09-11 LAB — PROTIME-INR
INR: 3.1 — ABNORMAL HIGH (ref 0.8–1.2)
Prothrombin Time: 31.4 seconds — ABNORMAL HIGH (ref 11.4–15.2)

## 2021-10-01 ENCOUNTER — Encounter (HOSPITAL_COMMUNITY)
Admission: RE | Admit: 2021-10-01 | Discharge: 2021-10-01 | Disposition: A | Discharge status: Home / Self Care | Payer: Medicare Other | Source: Ambulatory Visit | Attending: Family Medicine | Admitting: Family Medicine

## 2021-10-01 DIAGNOSIS — Z7901 Long term (current) use of anticoagulants: Secondary | ICD-10-CM | POA: Diagnosis present

## 2021-10-01 LAB — PROTIME-INR
INR: 3.6 — ABNORMAL HIGH (ref 0.8–1.2)
Prothrombin Time: 35.7 seconds — ABNORMAL HIGH (ref 11.4–15.2)

## 2021-10-02 ENCOUNTER — Ambulatory Visit (HOSPITAL_COMMUNITY): Payer: Medicare Other | Attending: Family Medicine

## 2021-10-02 DIAGNOSIS — G8929 Other chronic pain: Secondary | ICD-10-CM | POA: Diagnosis present

## 2021-10-02 DIAGNOSIS — R2689 Other abnormalities of gait and mobility: Secondary | ICD-10-CM | POA: Diagnosis present

## 2021-10-02 DIAGNOSIS — M25561 Pain in right knee: Secondary | ICD-10-CM | POA: Diagnosis present

## 2021-10-02 DIAGNOSIS — M79672 Pain in left foot: Secondary | ICD-10-CM | POA: Diagnosis present

## 2021-10-02 DIAGNOSIS — M6281 Muscle weakness (generalized): Secondary | ICD-10-CM | POA: Diagnosis present

## 2021-10-02 DIAGNOSIS — M79671 Pain in right foot: Secondary | ICD-10-CM | POA: Diagnosis present

## 2021-10-02 DIAGNOSIS — M057A Rheumatoid arthritis with rheumatoid factor of other specified site without organ or systems involvement: Secondary | ICD-10-CM

## 2021-10-02 DIAGNOSIS — R262 Difficulty in walking, not elsewhere classified: Secondary | ICD-10-CM

## 2021-10-02 NOTE — Therapy (Signed)
OUTPATIENT PHYSICAL THERAPY LOWER EXTREMITY EVALUATION   Patient Name: Cheryl Swanson MRN: [id number] DOB:[birth year], 88 y.o., female Today's Date: 10/02/2021   PT End of Session - 10/02/21 1427     Visit Number 1    Number of Visits 6    Date for PT Re-Evaluation 10/23/21    Authorization Type Medicare; BCBS supplement no copay, no VL, no auth    Progress Note Due on Visit 6    PT Start Time 1349    PT Stop Time 1424    PT Time Calculation (min) 35 min             Past Medical History:  Diagnosis Date   Glaucoma    Mitral valve disorder    Peptic ulcer    Rheumatoid arthritis(714.0)    Tricuspid valve disorder    Past Surgical History:  Procedure Laterality Date   ABDOMINAL HYSTERECTOMY  1971   HAND SURGERY  1980's   left   Patient Active Problem List   Diagnosis Date Noted   Pulmonary embolism (HCC) 08/16/2019   Fatigue 10/29/2012   Rheumatoid arthritis (HCC) 10/29/2012    PCP: [name], MD  REFERRING PROVIDER: PT eval/tx for rheumatoid arthritis per John Giovanni, MD   REFERRING DIAG: rheumatoid arthritis  THERAPY DIAG:  Rheumatoid arthritis of other site with positive rheumatoid factor (HCC)  Difficulty in walking, not elsewhere classified  Other abnormalities of gait and mobility  Muscle weakness (generalized)  Pain in right foot  Pain in left foot  Chronic pain of right knee  ONSET DATE: diagnosed with rheumatoid arthritis when 45 years  SUBJECTIVE:   SUBJECTIVE STATEMENT: Has had trouble with rheumatoid arthritis for many years; recently has had increased pain in the Right knee and both feet. She has had increased difficulty with walking and steps and getting up and down. Dr. Sudie Bailey referred to physical therapy. She is worried therapy may cause her to hurt more and so is cautiously agreeable to physical therapy.   PERTINENT HISTORY: RA for years  PAIN:  Are you having pain? Yes: NPRS scale: 7/10 Pain location: R  knee Pain description: sharp pain, aching Aggravating factors: mornings are worse Relieving factors: heat, ice, medication  PRECAUTIONS: Fall  WEIGHT BEARING RESTRICTIONS No  FALLS:  Has patient fallen in last 6 months? No  LIVING ENVIRONMENT:  son and daughter check on her daily Lives with: lives alone Lives in: House/apartment Stairs: Yes: External: 1 steps; on left going up Has following equipment at home: Single point cane, Walker - 4 wheeled, shower chair, and Grab bars  OCCUPATION: retired  PLOF: Independent with basic ADLs  PATIENT GOALS less pain and move better   OBJECTIVE:   DIAGNOSTIC FINDINGS: CLINICAL DATA:  Muscle weakness, rheumatoid arthritis in remission   EXAM: MRI LUMBAR SPINE WITHOUT CONTRAST   TECHNIQUE: Multiplanar, multisequence MR imaging of the lumbar spine was performed. No intravenous contrast was administered.   COMPARISON:  May 18, 2020   FINDINGS: Segmentation:  Standard.   Alignment: Stable including anterolisthesis at L4-L5 and dextrocurvature.   Vertebrae: Vertebral body heights are similar. Vertebral body hemangiomas are present at L2 and S1. Degenerative endplate marrow changes are present at L4-L5.   Conus medullaris and cauda equina: Conus extends to the L1 level. Conus and cauda equina appear normal.   Paraspinal and other soft tissues: Unremarkable.   Disc levels:   L1-L2:  No canal or foraminal stenosis.   L2-L3: Disc bulge slightly eccentric to the right.  Mild facet arthropathy and ligamentum flavum infolding. No canal or foraminal stenosis.   L3-L4: Disc bulge. Mild facet arthropathy and ligamentum flavum infolding. No canal or foraminal stenosis.   L4-L5: Anterolisthesis with uncovering of disc bulge. Mild facet arthropathy and ligamentum flavum infolding. No canal stenosis. Slight effacement subarticular recesses. No right foraminal stenosis. Mild left foraminal stenosis.   L5-S1: Disc bulge with  endplate osteophytic ridging and superimposed right foraminal/far lateral protrusion. No canal or left foraminal stenosis. Moderate right foraminal stenosis with disc/osteophyte potentially compressing the exiting nerve root.   IMPRESSION: Multilevel degenerative changes as detailed above similar to 2021 examination. No high-grade canal stenosis. Foraminal narrowing is greatest on the right at L5-S1.       COGNITION:  Overall cognitive status: Within functional limits for tasks assessed     SENSATION: WFL    POSTURE:  Guarded posture; forward flexed  PALPATION: Mild tenderness Right knee   LE MMT:  MMT Right 10/02/2021 Left 10/02/2021  Hip flexion 4 4  Hip extension    Hip abduction    Hip adduction    Hip internal rotation    Hip external rotation    Knee flexion (sitting) 4+ 4+  Knee extension 3+ 4  Ankle dorsiflexion    Ankle plantarflexion    Ankle inversion    Ankle eversion     (Blank rows = not tested)  FUNCTIONAL TESTS:  5 times sit to stand: 1 min 17 sec 2 minute walk test: 96 ft  GAIT: Distance walked: 96 ft Assistive device utilized: Single point cane Level of assistance: SBA Comments: left foot external rotation; bilateral knee valgus; slow antalgic gait; decreased stance right lower extremity    TODAY'S TREATMENT: Physical therapy evaluation, HEP instruction   PATIENT EDUCATION:  Education details: HEP, plan of care Person educated: Patient Education method: Explanation, Demonstration, and Handouts Education comprehension: verbalized understanding, returned demonstration, and needs further education   HOME EXERCISE PROGRAM: Access Code: [email or web address] URL: [email or web address] Date: 10/02/2021 Prepared by: AP - Rehab  Exercises - Seated Heel Toe Raises  - 2-3 x daily - 7 x weekly - 1 sets - 10 reps - Seated March  - 2-3 x daily - 7 x weekly - 1 sets - 10 reps - Seated Long Arc Quad  - 2-3 x daily - 7 x weekly - 1 sets  - 10 reps - Sit to Stand with Counter Support  - 2-3 x daily - 7 x weekly - 2 sets - 5 reps  ASSESSMENT:  CLINICAL IMPRESSION: Patient is a 88 y.o. pleasant female who was seen today for physical therapy evaluation and treatment for stiffness and gait and balance impairments due to rheumatoid arthritis. She presents with antalgic and slow gait pattern, weakness and decreased mobility of bilateral lower extremities that limit her function at home and in the community. Patient will benefit from skilled therapy interventions to address deficits and impairments and promote optimal functional mobility.    OBJECTIVE IMPAIRMENTS Abnormal gait, decreased activity tolerance, decreased balance, decreased coordination, decreased endurance, decreased mobility, difficulty walking, decreased ROM, decreased strength, hypomobility, impaired perceived functional ability, impaired flexibility, improper body mechanics, and pain.   ACTIVITY LIMITATIONS cleaning, community activity, driving, meal prep, laundry, medication management, yard work, shopping, and church.   PERSONAL FACTORS Age are also affecting patient's functional outcome.    REHAB POTENTIAL: Good  CLINICAL DECISION MAKING: Stable/uncomplicated  EVALUATION COMPLEXITY: Low   GOALS: Goals reviewed with patient? No  SHORT TERM GOALS: Target date: 10/16/2021  patient will be independent with initial HEP Baseline: Goal status: INITIAL   LONG TERM GOALS: Target date: 10/23/2021    Patient will be independent in self management strategies to improve quality of life and functional outcomes.   Baseline:  Goal status: INITIAL  2.  Patient will improve 5 x STS score from 1 min 17 sec to 45 sec to demonstrate improved functional mobility and increased lower extremity strength.  Baseline: 1 min 17 sec Goal status: INITIAL  3.  Patient will increased her distance on the from 96 ft to 150 ft to improve her access to home and  community Baseline:  Goal status: INITIAL  4.  Patient will report decreased pain right knee from 7/10 to 3/10 to improve tolerance for standing and walking Baseline: 7/10 Goal status: INITIAL    PLAN: PT FREQUENCY: 2x/week  PT DURATION: 3 weeks  PLANNED INTERVENTIONS: Therapeutic exercises, Therapeutic activity, Neuromuscular re-education, Balance training, Gait training, Patient/Family education, Joint manipulation, Joint mobilization, Stair training, Vestibular training, Canalith repositioning, DME instructions, Aquatic Therapy, Dry Needling, Electrical stimulation, Spinal manipulation, Spinal mobilization, Cryotherapy, Taping, Vasopneumatic device, Traction, Ultrasound, and Manual therapy  PLAN FOR NEXT SESSION: Review of HEP and goals; progress balance activities; strengthening and mobility of bilateral lower extremities.  Patient worried therapy will make her hurt more.   2:35 PM, 10/02/21 Elleigh Cassetta Small Rayen Dafoe MPT [place] [facility] [place] [phone]

## 2021-10-17 ENCOUNTER — Ambulatory Visit (HOSPITAL_COMMUNITY): Payer: Medicare Other

## 2021-10-17 DIAGNOSIS — R262 Difficulty in walking, not elsewhere classified: Secondary | ICD-10-CM

## 2021-10-17 DIAGNOSIS — M057A Rheumatoid arthritis with rheumatoid factor of other specified site without organ or systems involvement: Secondary | ICD-10-CM

## 2021-10-17 DIAGNOSIS — R2689 Other abnormalities of gait and mobility: Secondary | ICD-10-CM

## 2021-10-17 DIAGNOSIS — M79671 Pain in right foot: Secondary | ICD-10-CM

## 2021-10-17 DIAGNOSIS — M6281 Muscle weakness (generalized): Secondary | ICD-10-CM

## 2021-10-17 DIAGNOSIS — G8929 Other chronic pain: Secondary | ICD-10-CM

## 2021-10-17 DIAGNOSIS — M79672 Pain in left foot: Secondary | ICD-10-CM

## 2021-10-17 NOTE — Therapy (Signed)
OUTPATIENT PHYSICAL THERAPY LOWER EXTREMITY EVALUATION   Patient Name: Cheryl Swanson MRN: [id number] DOB:May 30, 1931, 88 y.o., female Today's Date: 10/17/2021   PT End of Session - 10/17/21 1657     Visit Number 2    Number of Visits 6    Date for PT Re-Evaluation 10/23/21    Authorization Type Medicare; BCBS supplement no copay, no VL, no auth    Progress Note Due on Visit 6    PT Start Time [phone]    PT Stop Time 0530    PT Time Calculation (min) 38 min              Past Medical History:  Diagnosis Date   Glaucoma    Mitral valve disorder    Peptic ulcer    Rheumatoid arthritis(714.0)    Tricuspid valve disorder    Past Surgical History:  Procedure Laterality Date   ABDOMINAL HYSTERECTOMY  1971   HAND SURGERY  1980's   left   Patient Active Problem List   Diagnosis Date Noted   Pulmonary embolism ([place]) 08/16/2019   Fatigue 10/29/2012   Rheumatoid arthritis ([place]) 10/29/2012    PCP: Lemmie Evens, MD  REFERRING PROVIDER: PT eval/tx for rheumatoid arthritis per Leslie Andrea, MD   REFERRING DIAG: rheumatoid arthritis  THERAPY DIAG:  Rheumatoid arthritis of other site with positive rheumatoid factor ([place])  Difficulty in walking, not elsewhere classified  Other abnormalities of gait and mobility  Pain in left foot  Pain in right foot  Muscle weakness (generalized)  Chronic pain of right knee  ONSET DATE: diagnosed with rheumatoid arthritis when 45 years  SUBJECTIVE:   SUBJECTIVE STATEMENT: Rt knee is still bothering her; "this damp weather has been making me feel bad"  PERTINENT HISTORY: RA for years  PAIN:  Are you having pain? Yes: NPRS scale: 7-8/10 Pain location: R knee Pain description: sharp pain, aching Aggravating factors: mornings are worse Relieving factors: heat, ice, medication  PRECAUTIONS: Fall  WEIGHT BEARING RESTRICTIONS No  FALLS:  Has patient fallen in last 6 months? No  LIVING ENVIRONMENT:  son and daughter  check on her daily Lives with: lives alone Lives in: House/apartment Stairs: Yes: External: 1 steps; on left going up Has following equipment at home: Single point cane, Walker - 4 wheeled, shower chair, and Grab bars  OCCUPATION: retired  PLOF: Independent with basic ADLs  PATIENT GOALS less pain and move better   OBJECTIVE:   DIAGNOSTIC FINDINGS: CLINICAL DATA:  Muscle weakness, rheumatoid arthritis in remission   EXAM: MRI LUMBAR SPINE WITHOUT CONTRAST   TECHNIQUE: Multiplanar, multisequence MR imaging of the lumbar spine was performed. No intravenous contrast was administered.   COMPARISON:  May 18, 2020   FINDINGS: Segmentation:  Standard.   Alignment: Stable including anterolisthesis at L4-L5 and dextrocurvature.   Vertebrae: Vertebral body heights are similar. Vertebral body hemangiomas are present at L2 and S1. Degenerative endplate marrow changes are present at L4-L5.   Conus medullaris and cauda equina: Conus extends to the L1 level. Conus and cauda equina appear normal.   Paraspinal and other soft tissues: Unremarkable.   Disc levels:   L1-L2:  No canal or foraminal stenosis.   L2-L3: Disc bulge slightly eccentric to the right. Mild facet arthropathy and ligamentum flavum infolding. No canal or foraminal stenosis.   L3-L4: Disc bulge. Mild facet arthropathy and ligamentum flavum infolding. No canal or foraminal stenosis.   L4-L5: Anterolisthesis with uncovering of disc bulge. Mild facet arthropathy and ligamentum flavum  infolding. No canal stenosis. Slight effacement subarticular recesses. No right foraminal stenosis. Mild left foraminal stenosis.   L5-S1: Disc bulge with endplate osteophytic ridging and superimposed right foraminal/far lateral protrusion. No canal or left foraminal stenosis. Moderate right foraminal stenosis with disc/osteophyte potentially compressing the exiting nerve root.   IMPRESSION: Multilevel degenerative changes  as detailed above similar to 2021 examination. No high-grade canal stenosis. Foraminal narrowing is greatest on the right at L5-S1.       COGNITION:  Overall cognitive status: Within functional limits for tasks assessed     SENSATION: WFL    POSTURE:  Guarded posture; forward flexed  PALPATION: Mild tenderness Right knee   LE MMT:  MMT Right 10/02/2021 Left 10/02/2021  Hip flexion 4 4  Hip extension    Hip abduction    Hip adduction    Hip internal rotation    Hip external rotation    Knee flexion (sitting) 4+ 4+  Knee extension 3+ 4  Ankle dorsiflexion    Ankle plantarflexion    Ankle inversion    Ankle eversion     (Blank rows = not tested)  FUNCTIONAL TESTS:  5 times sit to stand: 1 min 17 sec 2 minute walk test: 96 ft  GAIT: Distance walked: 96 ft Assistive device utilized: Single point cane Level of assistance: SBA Comments: left foot external rotation; bilateral knee valgus; slow antalgic gait; decreased stance right lower extremity    TODAY'S TREATMENT:  10/17/21  Review of HEP and goals Seated Heel/toe raises x 10 LAQ x 10 Marching x 10 Knee extension hang/stretch on stool x 3'  *trial of kinesiotape Right knee for support and knee pain  Sit to stand x 5 in 38 sec today  Standing: At counter Heel raises x 10 Hip abduction x 10 Hip extension x 10    PATIENT EDUCATION:  Education details: HEP, plan of care Person educated: Patient Education method: Explanation, Demonstration, and Handouts Education comprehension: verbalized understanding, returned demonstration, and needs further education   HOME EXERCISE PROGRAM: Access Code: [email or web address] URL: [email or web address] Date: 10/17/2021 Prepared by: AP - Rehab  Exercises - Seated Heel Toe Raises  - 2-3 x daily - 7 x weekly - 1 sets - 10 reps - Seated March  - 2-3 x daily - 7 x weekly - 1 sets - 10 reps - Seated Long Arc Quad  - 2-3 x daily - 7 x weekly - 1 sets - 10  reps - Sit to Stand with Counter Support  - 2-3 x daily - 7 x weekly - 2 sets - 5 reps - Seated Knee Extension Stretch with Chair  - 2-3 x daily - 7 x weekly - 1 sets - 1 reps - 2-3 min hold - Standing Heel Raise with Support  - 2-3 x daily - 7 x weekly - 1 sets - 10 reps - Standing Hip Abduction with Counter Support  - 2-3 x daily - 7 x weekly - 1 sets - 10 reps - Standing Hip Extension with Counter Support  - 2-3 x daily - 7 x weekly - 1 sets - 10 reps ASSESSMENT:  CLINICAL IMPRESSION: Today's session started with review of HEP and goals; patient verbalizes agreement with set goals.  Trial of kinesiotape to Right knee for Right knee pain; added knee extension hang to right knee and standing exercises for strength and balance today. Patient with trouble with pure extension on left hip; tends to rotate pelvis as well.  Ambulates with "peg leg"  Right leg although she states some decreased pain with taping so she may benefit from bracing. Patient will benefit from continued skilled therapy interventions to address deficits and impairments and promote optimal functional mobility.    OBJECTIVE IMPAIRMENTS Abnormal gait, decreased activity tolerance, decreased balance, decreased coordination, decreased endurance, decreased mobility, difficulty walking, decreased ROM, decreased strength, hypomobility, impaired perceived functional ability, impaired flexibility, improper body mechanics, and pain.   ACTIVITY LIMITATIONS cleaning, community activity, driving, meal prep, laundry, medication management, yard work, shopping, and church.   PERSONAL FACTORS Age are also affecting patient's functional outcome.    REHAB POTENTIAL: Good  CLINICAL DECISION MAKING: Stable/uncomplicated  EVALUATION COMPLEXITY: Low   GOALS: Goals reviewed with patient? No  SHORT TERM GOALS: Target date: 10/16/2021    patient will be independent with initial HEP Baseline: Goal status: ongoing   LONG TERM GOALS: Target  date: 10/23/2021    Patient will be independent in self management strategies to improve quality of life and functional outcomes.   Baseline:  Goal status: ongoing  2.  Patient will improve 5 x STS score from 1 min 17 sec to 45 sec to demonstrate improved functional mobility and increased lower extremity strength.  Baseline: 1 min 17 sec, 10/17/21 38 sec Goal status: met  3.  Patient will increased her distance on the 2MWT from 96 ft to 150 ft to improve her access to home and community Baseline:  Goal status: ongoing  4.  Patient will report decreased pain right knee from 7/10 to 3/10 to improve tolerance for standing and walking Baseline: 7/10 Goal status: ongoing    PLAN: PT FREQUENCY: 2x/week  PT DURATION: 3 weeks  PLANNED INTERVENTIONS: Therapeutic exercises, Therapeutic activity, Neuromuscular re-education, Balance training, Gait training, Patient/Family education, Joint manipulation, Joint mobilization, Stair training, Vestibular training, Canalith repositioning, DME instructions, Aquatic Therapy, Dry Needling, Electrical stimulation, Spinal manipulation, Spinal mobilization, Cryotherapy, Taping, Vasopneumatic device, Traction, Ultrasound, and Manual therapy  PLAN FOR NEXT SESSION:  progress balance activities; strengthening and mobility of bilateral lower extremities.  Patient worried therapy will make her hurt more.Patient having laser eye surgery this Friday 6/2 at [facility]   5:34 PM, 10/17/21 Farooq Petrovich Small Anastashia Westerfeld MPT [facility] [place] [phone]

## 2021-10-19 ENCOUNTER — Encounter (HOSPITAL_COMMUNITY): Payer: Medicare Other | Admitting: Physical Therapy

## 2021-10-22 ENCOUNTER — Ambulatory Visit (HOSPITAL_COMMUNITY): Payer: Medicare Other | Attending: Family Medicine

## 2021-10-22 DIAGNOSIS — M057A Rheumatoid arthritis with rheumatoid factor of other specified site without organ or systems involvement: Secondary | ICD-10-CM | POA: Diagnosis present

## 2021-10-22 DIAGNOSIS — M6281 Muscle weakness (generalized): Secondary | ICD-10-CM | POA: Diagnosis present

## 2021-10-22 DIAGNOSIS — R2689 Other abnormalities of gait and mobility: Secondary | ICD-10-CM

## 2021-10-22 DIAGNOSIS — R262 Difficulty in walking, not elsewhere classified: Secondary | ICD-10-CM | POA: Diagnosis present

## 2021-10-22 NOTE — Therapy (Signed)
OUTPATIENT PHYSICAL THERAPY LOWER EXTREMITY EVALUATION   Patient Name: Cheryl Swanson MRN: [id number] DOB:06/11/1931, 88 y.o., female Today's Date: 10/22/2021   PT End of Session - 10/22/21 1601     Visit Number 3    Number of Visits 6    Date for PT Re-Evaluation 10/23/21    Authorization Type Medicare; BCBS supplement no copay, no VL, no auth    Progress Note Due on Visit 6    PT Start Time 0923    PT Stop Time 3007    PT Time Calculation (min) 44 min              Past Medical History:  Diagnosis Date   Glaucoma    Mitral valve disorder    Peptic ulcer    Rheumatoid arthritis(714.0)    Tricuspid valve disorder    Past Surgical History:  Procedure Laterality Date   ABDOMINAL HYSTERECTOMY  1971   HAND SURGERY  1980's   left   Patient Active Problem List   Diagnosis Date Noted   Pulmonary embolism ([place]) 08/16/2019   Fatigue 10/29/2012   Rheumatoid arthritis ([place]) 10/29/2012    PCP: Lemmie Evens, MD  REFERRING PROVIDER: PT eval/tx for rheumatoid arthritis per Leslie Andrea, MD   REFERRING DIAG: rheumatoid arthritis  THERAPY DIAG:  Difficulty in walking, not elsewhere classified  Rheumatoid arthritis of other site with positive rheumatoid factor ([facility])  Other abnormalities of gait and mobility  Muscle weakness (generalized)  ONSET DATE: diagnosed with rheumatoid arthritis when 45 years  SUBJECTIVE:   SUBJECTIVE STATEMENT: Rt knee is still bothering patient. Patient reports trying to complete HEP.   PERTINENT HISTORY: RA for years  PAIN:  Are you having pain? Yes: NPRS scale: 7/10 Pain location: R knee Pain description: sharp pain, aching Aggravating factors: mornings are worse Relieving factors: heat, ice, medication  PRECAUTIONS: Fall  WEIGHT BEARING RESTRICTIONS No  FALLS:  Has patient fallen in last 6 months? No  LIVING ENVIRONMENT:  son and daughter check on her daily Lives with: lives alone Lives in: House/apartment Stairs:  Yes: External: 1 steps; on left going up Has following equipment at home: Single point cane, Walker - 4 wheeled, shower chair, and Grab bars  OCCUPATION: retired  PLOF: Independent with basic ADLs  PATIENT GOALS less pain and move better   OBJECTIVE:   DIAGNOSTIC FINDINGS: CLINICAL DATA:  Muscle weakness, rheumatoid arthritis in remission   EXAM: MRI LUMBAR SPINE WITHOUT CONTRAST   TECHNIQUE: Multiplanar, multisequence MR imaging of the lumbar spine was performed. No intravenous contrast was administered.   COMPARISON:  May 18, 2020   FINDINGS: Segmentation:  Standard.   Alignment: Stable including anterolisthesis at L4-L5 and dextrocurvature.   Vertebrae: Vertebral body heights are similar. Vertebral body hemangiomas are present at L2 and S1. Degenerative endplate marrow changes are present at L4-L5.   Conus medullaris and cauda equina: Conus extends to the L1 level. Conus and cauda equina appear normal.   Paraspinal and other soft tissues: Unremarkable.   Disc levels:   L1-L2:  No canal or foraminal stenosis.   L2-L3: Disc bulge slightly eccentric to the right. Mild facet arthropathy and ligamentum flavum infolding. No canal or foraminal stenosis.   L3-L4: Disc bulge. Mild facet arthropathy and ligamentum flavum infolding. No canal or foraminal stenosis.   L4-L5: Anterolisthesis with uncovering of disc bulge. Mild facet arthropathy and ligamentum flavum infolding. No canal stenosis. Slight effacement subarticular recesses. No right foraminal stenosis. Mild left foraminal stenosis.  L5-S1: Disc bulge with endplate osteophytic ridging and superimposed right foraminal/far lateral protrusion. No canal or left foraminal stenosis. Moderate right foraminal stenosis with disc/osteophyte potentially compressing the exiting nerve root.   IMPRESSION: Multilevel degenerative changes as detailed above similar to 2021 examination. No high-grade canal stenosis.  Foraminal narrowing is greatest on the right at L5-S1.       COGNITION:  Overall cognitive status: Within functional limits for tasks assessed     SENSATION: WFL    POSTURE:  Guarded posture; forward flexed  PALPATION: Mild tenderness Right knee   LE MMT:  MMT Right 10/02/2021 Left 10/02/2021  Hip flexion 4 4  Hip extension    Hip abduction    Hip adduction    Hip internal rotation    Hip external rotation    Knee flexion (sitting) 4+ 4+  Knee extension 3+ 4  Ankle dorsiflexion    Ankle plantarflexion    Ankle inversion    Ankle eversion     (Blank rows = not tested)  FUNCTIONAL TESTS:  5 times sit to stand: 1 min 17 sec 2 minute walk test: 96 ft  GAIT: Distance walked: 96 ft Assistive device utilized: Single point cane Level of assistance: SBA Comments: left foot external rotation; bilateral knee valgus; slow antalgic gait; decreased stance right lower extremity    TODAY'S TREATMENT:  10/22/21 Heel/toe raisex10 Slant board modified for only partial stretch Standing marches x10 b/l with b/l UE support-> no ue support x10 at CG Step ups to 4inch box anterior x10 b/l Lateral step up and over with cues for big steps x12 Low hurdle step overs no AD with CG 4 hurdles x6 laps Side steps in // bars with red therband x6 laps Monster walk in // bars with red therband x 4 laps Hooklying hip abd x20 with red theraband Bridge with iso hip abduction 2x10 Slr with red theraband resistance x10 b/l Nustep x5 mins level 1 All at CG up to [place]   10/17/21  Review of HEP and goals Seated Heel/toe raises x 10 LAQ x 10 Marching x 10 Knee extension hang/stretch on stool x 3'  *trial of kinesiotape Right knee for support and knee pain  Sit to stand x 5 in 38 sec today  Standing: At counter Heel raises x 10 Hip abduction x 10 Hip extension x 10    PATIENT EDUCATION:  Education details: HEP, plan of care Person educated: Patient Education method:  Explanation, Demonstration, and Handouts Education comprehension: verbalized understanding, returned demonstration, and needs further education   HOME EXERCISE PROGRAM: Access Code: [email or web address] URL: [email or web address] Date: 10/17/2021 Prepared by: AP - Rehab  Exercises - Seated Heel Toe Raises  - 2-3 x daily - 7 x weekly - 1 sets - 10 reps - Seated March  - 2-3 x daily - 7 x weekly - 1 sets - 10 reps - Seated Long Arc Quad  - 2-3 x daily - 7 x weekly - 1 sets - 10 reps - Sit to Stand with Counter Support  - 2-3 x daily - 7 x weekly - 2 sets - 5 reps - Seated Knee Extension Stretch with Chair  - 2-3 x daily - 7 x weekly - 1 sets - 1 reps - 2-3 min hold - Standing Heel Raise with Support  - 2-3 x daily - 7 x weekly - 1 sets - 10 reps - Standing Hip Abduction with Counter Support  - 2-3 x daily - 7 x weekly - 1  sets - 10 reps - Standing Hip Extension with Counter Support  - 2-3 x daily - 7 x weekly - 1 sets - 10 reps ASSESSMENT:  CLINICAL IMPRESSION: Patient takes brief seated rest breaks during session. Patient reports pain in feet secondary to calluses that every so often need to be cut off. Patient is wearing a pair of flats with no support but states it is the only thing she is able to wear. Reports she had custom sneakers made several years ago but they were not comfortable. Patient limited in exercise with anything that requires right knee flexion. Patient demonstrates ability to lift and clear feet over hurdles but when no hurdle is present reverts back to sliding feet on ground. Pt with difficulty with cane sequencing.  Patient will benefit from continued skilled therapy interventions to address deficits and impairments and promote optimal functional mobility.    OBJECTIVE IMPAIRMENTS Abnormal gait, decreased activity tolerance, decreased balance, decreased coordination, decreased endurance, decreased mobility, difficulty walking, decreased ROM, decreased strength,  hypomobility, impaired perceived functional ability, impaired flexibility, improper body mechanics, and pain.   ACTIVITY LIMITATIONS cleaning, community activity, driving, meal prep, laundry, medication management, yard work, shopping, and church.   PERSONAL FACTORS Age are also affecting patient's functional outcome.    REHAB POTENTIAL: Good  CLINICAL DECISION MAKING: Stable/uncomplicated  EVALUATION COMPLEXITY: Low   GOALS: Goals reviewed with patient? No  SHORT TERM GOALS: Target date: 10/16/2021    patient will be independent with initial HEP Baseline: Goal status: ongoing   LONG TERM GOALS: Target date: 10/23/2021    Patient will be independent in self management strategies to improve quality of life and functional outcomes.   Baseline:  Goal status: ongoing  2.  Patient will improve 5 x STS score from 1 min 17 sec to 45 sec to demonstrate improved functional mobility and increased lower extremity strength.  Baseline: 1 min 17 sec, 10/17/21 38 sec Goal status: met  3.  Patient will increased her distance on the 2MWT from 96 ft to 150 ft to improve her access to home and community Baseline:  Goal status: ongoing  4.  Patient will report decreased pain right knee from 7/10 to 3/10 to improve tolerance for standing and walking Baseline: 7/10 Goal status: ongoing    PLAN: PT FREQUENCY: 2x/week  PT DURATION: 3 weeks  PLANNED INTERVENTIONS: Therapeutic exercises, Therapeutic activity, Neuromuscular re-education, Balance training, Gait training, Patient/Family education, Joint manipulation, Joint mobilization, Stair training, Vestibular training, Canalith repositioning, DME instructions, Aquatic Therapy, Dry Needling, Electrical stimulation, Spinal manipulation, Spinal mobilization, Cryotherapy, Taping, Vasopneumatic device, Traction, Ultrasound, and Manual therapy  PLAN FOR NEXT SESSION:  progress balance activities; strengthening and mobility of bilateral lower  extremities.  Patient worried therapy will make her hurt more.   5:11 PM, 10/22/21 Tiea Manninen PT, DPT

## 2021-10-24 ENCOUNTER — Encounter (HOSPITAL_COMMUNITY): Payer: Self-pay

## 2021-10-24 ENCOUNTER — Ambulatory Visit (HOSPITAL_COMMUNITY): Payer: Medicare Other | Attending: Family Medicine

## 2021-10-24 DIAGNOSIS — M79672 Pain in left foot: Secondary | ICD-10-CM

## 2021-10-24 DIAGNOSIS — M057A Rheumatoid arthritis with rheumatoid factor of other specified site without organ or systems involvement: Secondary | ICD-10-CM

## 2021-10-24 DIAGNOSIS — R262 Difficulty in walking, not elsewhere classified: Secondary | ICD-10-CM

## 2021-10-24 DIAGNOSIS — M79671 Pain in right foot: Secondary | ICD-10-CM

## 2021-10-24 DIAGNOSIS — R2689 Other abnormalities of gait and mobility: Secondary | ICD-10-CM | POA: Diagnosis present

## 2021-10-24 DIAGNOSIS — M25561 Pain in right knee: Secondary | ICD-10-CM | POA: Insufficient documentation

## 2021-10-24 DIAGNOSIS — M6281 Muscle weakness (generalized): Secondary | ICD-10-CM | POA: Diagnosis present

## 2021-10-24 DIAGNOSIS — G8929 Other chronic pain: Secondary | ICD-10-CM

## 2021-10-24 NOTE — Therapy (Addendum)
OUTPATIENT PHYSICAL THERAPY LOWER EXTREMITY TREATMENT  Progress Note Reporting Period 10/02/2021 to 10/24/2021  See note below for Objective Data and Assessment of Progress/Goals.       Patient Name: Cheryl Swanson MRN: [id number] DOB:[birth year], 88 y.o., female Today's Date: 10/24/2021   PT End of Session - 10/24/21 1457     Visit Number 4    Number of Visits 6    Date for PT Re-Evaluation 10/23/21    Authorization Type Medicare; BCBS supplement no copay, no VL, no auth    Progress Note Due on Visit 6    PT Start Time 1444    PT Stop Time 1528    PT Time Calculation (min) 44 min    Activity Tolerance Patient tolerated treatment well    Behavior During Therapy WFL for tasks assessed/performed               Past Medical History:  Diagnosis Date   Glaucoma    Mitral valve disorder    Peptic ulcer    Rheumatoid arthritis(714.0)    Tricuspid valve disorder    Past Surgical History:  Procedure Laterality Date   ABDOMINAL HYSTERECTOMY  1971   HAND SURGERY  1980's   left   Patient Active Problem List   Diagnosis Date Noted   Pulmonary embolism ([place]) 08/16/2019   Fatigue 10/29/2012   Rheumatoid arthritis ([place]) 10/29/2012    PCP: Lemmie Evens, MD  REFERRING PROVIDER: PT eval/tx for rheumatoid arthritis per Leslie Andrea, MD   REFERRING DIAG: rheumatoid arthritis  THERAPY DIAG:  Difficulty in walking, not elsewhere classified  Rheumatoid arthritis of other site with positive rheumatoid factor (HCC)  Other abnormalities of gait and mobility  Muscle weakness (generalized)  Pain in left foot  Pain in right foot  Chronic pain of right knee  ONSET DATE: diagnosed with rheumatoid arthritis when 45 years  SUBJECTIVE:   SUBJECTIVE STATEMENT: Pt stated she can tell the weather, increased stiffness/soreness Rt knee  PERTINENT HISTORY: RA for years  PAIN:  Are you having pain? Yes: NPRS scale: 7/10 Pain location: R knee Pain description: sharp  pain, aching Aggravating factors: mornings are worse Relieving factors: heat, ice, medication  PRECAUTIONS: Fall  WEIGHT BEARING RESTRICTIONS No  FALLS:  Has patient fallen in last 6 months? No  LIVING ENVIRONMENT:  son and daughter check on her daily Lives with: lives alone Lives in: House/apartment Stairs: Yes: External: 1 steps; on left going up Has following equipment at home: Single point cane, Walker - 4 wheeled, shower chair, and Grab bars  OCCUPATION: retired  PLOF: Independent with basic ADLs  PATIENT GOALS less pain and move better   OBJECTIVE:   DIAGNOSTIC FINDINGS: CLINICAL DATA:  Muscle weakness, rheumatoid arthritis in remission   EXAM: MRI LUMBAR SPINE WITHOUT CONTRAST   TECHNIQUE: Multiplanar, multisequence MR imaging of the lumbar spine was performed. No intravenous contrast was administered.   COMPARISON:  May 18, 2020   FINDINGS: Segmentation:  Standard.   Alignment: Stable including anterolisthesis at L4-L5 and dextrocurvature.   Vertebrae: Vertebral body heights are similar. Vertebral body hemangiomas are present at L2 and S1. Degenerative endplate marrow changes are present at L4-L5.   Conus medullaris and cauda equina: Conus extends to the L1 level. Conus and cauda equina appear normal.   Paraspinal and other soft tissues: Unremarkable.   Disc levels:   L1-L2:  No canal or foraminal stenosis.   L2-L3: Disc bulge slightly eccentric to the right. Mild facet arthropathy and ligamentum  flavum infolding. No canal or foraminal stenosis.   L3-L4: Disc bulge. Mild facet arthropathy and ligamentum flavum infolding. No canal or foraminal stenosis.   L4-L5: Anterolisthesis with uncovering of disc bulge. Mild facet arthropathy and ligamentum flavum infolding. No canal stenosis. Slight effacement subarticular recesses. No right foraminal stenosis. Mild left foraminal stenosis.   L5-S1: Disc bulge with endplate osteophytic ridging and  superimposed right foraminal/far lateral protrusion. No canal or left foraminal stenosis. Moderate right foraminal stenosis with disc/osteophyte potentially compressing the exiting nerve root.   IMPRESSION: Multilevel degenerative changes as detailed above similar to 2021 examination. No high-grade canal stenosis. Foraminal narrowing is greatest on the right at L5-S1.       COGNITION:  Overall cognitive status: Within functional limits for tasks assessed     SENSATION: WFL    POSTURE:  Guarded posture; forward flexed  PALPATION: Mild tenderness Right knee   LE MMT:  MMT Right 10/02/2021 Left 10/02/2021 Left 10/24/21 Right 10/24/21        Hip flexion 4 4    Hip extension   Prone: 3+/5 Prone:3/5  Hip abduction   Sidelying: 4/5 Sidelying: 4/5  Hip adduction      Hip internal rotation      Hip external rotation      Knee flexion (sitting) 4+ 4+ Prone: 4+/5 Prone: 4+/5  Knee extension 3+ 4 4+ 4+/5  Ankle dorsiflexion      Ankle plantarflexion      Ankle inversion      Ankle eversion       (Blank rows = not tested)  AROM Rt knee lacking 5 degrees--> 90 degrees  FUNCTIONAL TESTS:  5 times sit to stand: 1 min 17 sec 2 minute walk test: Eval 96 ft; 10/24/21: 114f with SPC  GAIT: Distance walked: 96 ft Assistive device utilized: Single point cane Level of assistance: SBA Comments: left foot external rotation; bilateral knee valgus; slow antalgic gait; decreased stance right lower extremity    TODAY'S TREATMENT: 10/24/21: 2MWT 1535fwith SPC Nustep hill L2 x 5 min Gait training 6024fith SPC Lt hand, cueing for coordination MMT ROM measurement Bridge 2x 10 Heel/toe raises 10x Standing marching with 1 UE support alternating Gait training 3RT inside // bars for heel to toe mechanics.   10/22/21 Heel/toe raisex10 Slant board modified for only partial stretch Standing marches x10 b/l with b/l UE support-> no ue support x10 at CG Step ups to 4inch box anterior  x10 b/l Lateral step up and over with cues for big steps x12 Low hurdle step overs no AD with CG 4 hurdles x6 laps Side steps in // bars with red therband x6 laps Monster walk in // bars with red therband x 4 laps Hooklying hip abd x20 with red theraband Bridge with iso hip abduction 2x10 Slr with red theraband resistance x10 b/l Nustep x5 mins level 1 All at CG up to mina   10/17/21  Review of HEP and goals Seated Heel/toe raises x 10 LAQ x 10 Marching x 10 Knee extension hang/stretch on stool x 3'  *trial of kinesiotape Right knee for support and knee pain  Sit to stand x 5 in 38 sec today  Standing: At counter Heel raises x 10 Hip abduction x 10 Hip extension x 10    PATIENT EDUCATION:  Education details: HEP, plan of care Person educated: Patient Education method: Explanation, Demonstration, and Handouts Education comprehension: verbalized understanding, returned demonstration, and needs further education   HOME EXERCISE PROGRAM:  Access Code: [email or web address] URL: [email or web address] Date: 10/17/2021 Prepared by: AP - Rehab  Exercises - Seated Heel Toe Raises  - 2-3 x daily - 7 x weekly - 1 sets - 10 reps - Seated March  - 2-3 x daily - 7 x weekly - 1 sets - 10 reps - Seated Long Arc Quad  - 2-3 x daily - 7 x weekly - 1 sets - 10 reps - Sit to Stand with Counter Support  - 2-3 x daily - 7 x weekly - 2 sets - 5 reps - Seated Knee Extension Stretch with Chair  - 2-3 x daily - 7 x weekly - 1 sets - 1 reps - 2-3 min hold - Standing Heel Raise with Support  - 2-3 x daily - 7 x weekly - 1 sets - 10 reps - Standing Hip Abduction with Counter Support  - 2-3 x daily - 7 x weekly - 1 sets - 10 reps - Standing Hip Extension with Counter Support  - 2-3 x daily - 7 x weekly - 1 sets - 10 reps ASSESSMENT:  CLINICAL IMPRESSION: Reviewed goals with great improvements noted with increased cadence and timing with STS.  Pt continues to be limited by pain.  Ambulates  very stiff legged, AROM lacking 5 degrees to 90 degrees.  Pt educated on use of SPC Lt LU to assist with support during gait, cueing required for sequence and coordination.  Following education on gait mechanics and encouraged to keep joints moving pt reports pain reduced to 5/10 at EOS.  Improved time notes with STS though pt does require to use BUE A.  Reports of compliance with HEP regularly.  Has met 1/1 STG and 2/4 LTGs.   OBJECTIVE IMPAIRMENTS Abnormal gait, decreased activity tolerance, decreased balance, decreased coordination, decreased endurance, decreased mobility, difficulty walking, decreased ROM, decreased strength, hypomobility, impaired perceived functional ability, impaired flexibility, improper body mechanics, and pain.   ACTIVITY LIMITATIONS cleaning, community activity, driving, meal prep, laundry, medication management, yard work, shopping, and church.   PERSONAL FACTORS Age are also affecting patient's functional outcome.    REHAB POTENTIAL: Good  CLINICAL DECISION MAKING: Stable/uncomplicated  EVALUATION COMPLEXITY: Low   GOALS: Goals reviewed with patient? No  SHORT TERM GOALS: Target date: 10/16/2021    patient will be independent with initial HEP Baseline:  10/24/21:  Reports compliance with HEP daily multiple Goal status: ongoing   LONG TERM GOALS: Target date: 10/23/2021    Patient will be independent in self management strategies to improve quality of life and functional outcomes.   Baseline: 10/24/21:  Reports compliance with HEP daily multiple Goal status: ongoing  2.  Patient will improve 5 x STS score from 1 min 17 sec to 45 sec to demonstrate improved functional mobility and increased lower extremity strength.  Baseline: Eval:1 min 17 sec, 10/17/21 38 sec; 10/24/21:  39 seconds with Bil HHA Goal status: met  3.  Patient will increased her distance on the 2MWT from 96 ft to 150 ft to improve her access to home and community Baseline: 152 ft with SPC Goal  status: met  4.  Patient will report decreased pain right knee from 7/10 to 3/10 to improve tolerance for standing and walking Baseline: 7/10 Goal status: ongoing    PLAN: PT FREQUENCY: 2x/week  PT DURATION: 3 weeks  PLANNED INTERVENTIONS: Therapeutic exercises, Therapeutic activity, Neuromuscular re-education, Balance training, Gait training, Patient/Family education, Joint manipulation, Joint mobilization, Stair training, Vestibular training, Canalith repositioning, DME instructions, Aquatic Therapy, Dry  Needling, Electrical stimulation, Spinal manipulation, Spinal mobilization, Cryotherapy, Taping, Vasopneumatic device, Traction, Ultrasound, and Manual therapy  PLAN FOR NEXT SESSION:  progress balance activities; strengthening and mobility of bilateral lower extremities.  Patient worried therapy will make her hurt more.  Continue gait training to improve knee mobility for pain control.  Ihor Austin, LPTA/CLT; CBIS [phone]  4:43 PM, 10/24/21  10:00 AM, 10/25/21 Amy Small Lynch MPT [facility] [place] [phone]

## 2021-10-29 ENCOUNTER — Encounter (HOSPITAL_COMMUNITY): Payer: Medicare Other

## 2021-10-30 ENCOUNTER — Other Ambulatory Visit (HOSPITAL_COMMUNITY)
Admission: RE | Admit: 2021-10-30 | Discharge: 2021-10-30 | Disposition: A | Discharge status: Home / Self Care | Payer: Medicare Other | Source: Ambulatory Visit | Attending: Family Medicine | Admitting: Family Medicine

## 2021-10-30 DIAGNOSIS — Z7901 Long term (current) use of anticoagulants: Secondary | ICD-10-CM | POA: Insufficient documentation

## 2021-10-30 LAB — COMPREHENSIVE METABOLIC PANEL
ALT: 12 U/L (ref 0–44)
AST: 22 U/L (ref 15–41)
Albumin: 3.8 g/dL (ref 3.5–5.0)
Alkaline Phosphatase: 59 U/L (ref 38–126)
Anion gap: 3 — ABNORMAL LOW (ref 5–15)
BUN: 14 mg/dL (ref 8–23)
CO2: 29 mmol/L (ref 22–32)
Calcium: 9.8 mg/dL (ref 8.9–10.3)
Chloride: 106 mmol/L (ref 98–111)
Creatinine, Ser: 0.74 mg/dL (ref 0.44–1.00)
GFR, Estimated: >60 mL/min (ref >60)
Glucose, Bld: 92 mg/dL (ref 70–99)
Potassium: 3.5 mmol/L (ref 3.5–5.1)
Sodium: 138 mmol/L (ref 135–145)
Total Bilirubin: 1.3 mg/dL — ABNORMAL HIGH (ref 0.3–1.2)
Total Protein: 7.4 g/dL (ref 6.5–8.1)

## 2021-10-30 LAB — CBC
HCT: 46.2 % — ABNORMAL HIGH (ref 36.0–46.0)
Hemoglobin: 14.8 g/dL (ref 12.0–15.0)
MCH: 31.5 pg (ref 26.0–34.0)
MCHC: 32 g/dL (ref 30.0–36.0)
MCV: 98.3 fL (ref 80.0–100.0)
Platelets: 321 10*3/uL (ref 150–400)
RBC: 4.7 MIL/uL (ref 3.87–5.11)
RDW: 14.8 % (ref 11.5–15.5)
WBC: 5.2 10*3/uL (ref 4.0–10.5)
nRBC: 0 % (ref 0.0–0.2)

## 2021-10-30 LAB — PROTIME-INR
INR: 2.7 — ABNORMAL HIGH (ref 0.8–1.2)
Prothrombin Time: 28.3 seconds — ABNORMAL HIGH (ref 11.4–15.2)

## 2021-10-31 ENCOUNTER — Ambulatory Visit (HOSPITAL_COMMUNITY): Payer: Medicare Other | Attending: Family Medicine

## 2021-10-31 DIAGNOSIS — M069 Rheumatoid arthritis, unspecified: Secondary | ICD-10-CM | POA: Diagnosis present

## 2021-10-31 DIAGNOSIS — M6281 Muscle weakness (generalized): Secondary | ICD-10-CM

## 2021-10-31 DIAGNOSIS — R2689 Other abnormalities of gait and mobility: Secondary | ICD-10-CM

## 2021-10-31 DIAGNOSIS — M057A Rheumatoid arthritis with rheumatoid factor of other specified site without organ or systems involvement: Secondary | ICD-10-CM

## 2021-10-31 DIAGNOSIS — R262 Difficulty in walking, not elsewhere classified: Secondary | ICD-10-CM

## 2021-10-31 DIAGNOSIS — M79671 Pain in right foot: Secondary | ICD-10-CM

## 2021-10-31 DIAGNOSIS — M79672 Pain in left foot: Secondary | ICD-10-CM

## 2021-10-31 DIAGNOSIS — G8929 Other chronic pain: Secondary | ICD-10-CM

## 2021-10-31 NOTE — Therapy (Signed)
OUTPATIENT PHYSICAL THERAPY LOWER EXTREMITY DISCHARGE  PHYSICAL THERAPY DISCHARGE SUMMARY  Visits from Start of Care: 5  Current functional level related to goals / functional outcomes: See below   Remaining deficits: See below   Education / Equipment: See below   Patient agrees to discharge. Patient goals were partially met. Patient is being discharged due to being pleased with the current functional level.     Patient Name: Cheryl Swanson MRN: [id number] DOB:07/02/1931, 88 y.o., female Today's Date: 10/31/2021   PT End of Session - 10/31/21 1444     Visit Number 5    Number of Visits 6    Date for PT Re-Evaluation 11/02/21    Authorization Type Medicare; BCBS supplement no copay, no VL, no auth    Progress Note Due on Visit 6    PT Start Time 0240    PT Stop Time 0307    PT Time Calculation (min) 27 min    Activity Tolerance Patient tolerated treatment well    Behavior During Therapy WFL for tasks assessed/performed                Past Medical History:  Diagnosis Date   Glaucoma    Mitral valve disorder    Peptic ulcer    Rheumatoid arthritis(714.0)    Tricuspid valve disorder    Past Surgical History:  Procedure Laterality Date   ABDOMINAL HYSTERECTOMY  1971   HAND SURGERY  1980's   left   Patient Active Problem List   Diagnosis Date Noted   Pulmonary embolism ([place]) 08/16/2019   Fatigue 10/29/2012   Rheumatoid arthritis ([place]) 10/29/2012    PCP: Lemmie Evens, MD  REFERRING PROVIDER: PT eval/tx for rheumatoid arthritis per Leslie Andrea, MD   REFERRING DIAG: rheumatoid arthritis  THERAPY DIAG:  Pain in left foot  Difficulty in walking, not elsewhere classified  Rheumatoid arthritis of other site with positive rheumatoid factor (HCC)  Pain in right foot  Other abnormalities of gait and mobility  Chronic pain of right knee  Muscle weakness (generalized)  ONSET DATE: diagnosed with rheumatoid arthritis when 45  years  SUBJECTIVE:   SUBJECTIVE STATEMENT: States she is getting up and down better but her right knee is still sore; has trouble with stairs still but overall is moving much better. Patient is pleased with her current functional status.   PERTINENT HISTORY: RA for years  PAIN:  Are you having pain? Yes: NPRS scale: 7/10 Pain location: R knee Pain description: sharp pain, aching Aggravating factors: mornings are worse Relieving factors: heat, ice, medication  PRECAUTIONS: Fall  WEIGHT BEARING RESTRICTIONS No  FALLS:  Has patient fallen in last 6 months? No  LIVING ENVIRONMENT:  son and daughter check on her daily Lives with: lives alone Lives in: House/apartment Stairs: Yes: External: 1 steps; on left going up Has following equipment at home: Single point cane, Walker - 4 wheeled, shower chair, and Grab bars  OCCUPATION: retired  PLOF: Independent with basic ADLs  PATIENT GOALS less pain and move better   OBJECTIVE:   DIAGNOSTIC FINDINGS: CLINICAL DATA:  Muscle weakness, rheumatoid arthritis in remission   EXAM: MRI LUMBAR SPINE WITHOUT CONTRAST   TECHNIQUE: Multiplanar, multisequence MR imaging of the lumbar spine was performed. No intravenous contrast was administered.   COMPARISON:  May 18, 2020   FINDINGS: Segmentation:  Standard.   Alignment: Stable including anterolisthesis at L4-L5 and dextrocurvature.   Vertebrae: Vertebral body heights are similar. Vertebral body hemangiomas are present at L2  and S1. Degenerative endplate marrow changes are present at L4-L5.   Conus medullaris and cauda equina: Conus extends to the L1 level. Conus and cauda equina appear normal.   Paraspinal and other soft tissues: Unremarkable.   Disc levels:   L1-L2:  No canal or foraminal stenosis.   L2-L3: Disc bulge slightly eccentric to the right. Mild facet arthropathy and ligamentum flavum infolding. No canal or foraminal stenosis.   L3-L4: Disc bulge.  Mild facet arthropathy and ligamentum flavum infolding. No canal or foraminal stenosis.   L4-L5: Anterolisthesis with uncovering of disc bulge. Mild facet arthropathy and ligamentum flavum infolding. No canal stenosis. Slight effacement subarticular recesses. No right foraminal stenosis. Mild left foraminal stenosis.   L5-S1: Disc bulge with endplate osteophytic ridging and superimposed right foraminal/far lateral protrusion. No canal or left foraminal stenosis. Moderate right foraminal stenosis with disc/osteophyte potentially compressing the exiting nerve root.   IMPRESSION: Multilevel degenerative changes as detailed above similar to 2021 examination. No high-grade canal stenosis. Foraminal narrowing is greatest on the right at L5-S1.       COGNITION:  Overall cognitive status: Within functional limits for tasks assessed     SENSATION: WFL    POSTURE:  Guarded posture; forward flexed  PALPATION: Mild tenderness Right knee   LE MMT:  MMT Right 10/02/2021 Left 10/02/2021 Left 10/24/21 Right 10/24/21        Hip flexion 4 4    Hip extension   Prone: 3+/5 Prone:3/5  Hip abduction   Sidelying: 4/5 Sidelying: 4/5  Hip adduction      Hip internal rotation      Hip external rotation      Knee flexion (sitting) 4+ 4+ Prone: 4+/5 Prone: 4+/5  Knee extension 3+ 4 4+ 4+/5  Ankle dorsiflexion      Ankle plantarflexion      Ankle inversion      Ankle eversion       (Blank rows = not tested)  AROM Rt knee lacking 5 degrees--> 90 degrees  FUNCTIONAL TESTS:  5 times sit to stand: 1 min 17 sec 2 minute walk test: Eval 96 ft; 10/24/21: 148f with SPC  GAIT: Distance walked: 96 ft Assistive device utilized: Single point cane Level of assistance: SBA Comments: left foot external rotation; bilateral knee valgus; slow antalgic gait; decreased stance right lower extremity    TODAY'S TREATMENT: 10/31/21 Seated heel/toe raises x 10 LAQ's Marching x 10  Sit to stand x  5  Standing: At counter Heel raises x 10 Hip abduction x 10 Hip extension x 10     10/24/21: 2MWT 1572fwith SPC Nustep hill L2 x 5 min Gait training 6056fith SPC Lt hand, cueing for coordination MMT ROM measurement Bridge 2x 10 Heel/toe raises 10x Standing marching with 1 UE support alternating Gait training 3RT inside // bars for heel to toe mechanics.   10/22/21 Heel/toe raisex10 Slant board modified for only partial stretch Standing marches x10 b/l with b/l UE support-> no ue support x10 at CG Step ups to 4inch box anterior x10 b/l Lateral step up and over with cues for big steps x12 Low hurdle step overs no AD with CG 4 hurdles x6 laps Side steps in // bars with red therband x6 laps Monster walk in // bars with red therband x 4 laps Hooklying hip abd x20 with red theraband Bridge with iso hip abduction 2x10 Slr with red theraband resistance x10 b/l Nustep x5 mins level 1 All at CG up to  mina   10/17/21  Review of HEP and goals Seated Heel/toe raises x 10 LAQ x 10 Marching x 10 Knee extension hang/stretch on stool x 3'  *trial of kinesiotape Right knee for support and knee pain  Sit to stand x 5 in 38 sec today  Standing: At counter Heel raises x 10 Hip abduction x 10 Hip extension x 10    PATIENT EDUCATION:  Education details: HEP, plan of care Person educated: Patient Education method: Explanation, Demonstration, and Handouts Education comprehension: verbalized understanding, returned demonstration, and needs further education   HOME EXERCISE PROGRAM: Access Code: [email or web address] URL: [email or web address] Date: 10/17/2021 Prepared by: AP - Rehab  Exercises - Seated Heel Toe Raises  - 2-3 x daily - 7 x weekly - 1 sets - 10 reps - Seated March  - 2-3 x daily - 7 x weekly - 1 sets - 10 reps - Seated Long Arc Quad  - 2-3 x daily - 7 x weekly - 1 sets - 10 reps - Sit to Stand with Counter Support  - 2-3 x daily - 7 x weekly - 2 sets -  5 reps - Seated Knee Extension Stretch with Chair  - 2-3 x daily - 7 x weekly - 1 sets - 1 reps - 2-3 min hold - Standing Heel Raise with Support  - 2-3 x daily - 7 x weekly - 1 sets - 10 reps - Standing Hip Abduction with Counter Support  - 2-3 x daily - 7 x weekly - 1 sets - 10 reps - Standing Hip Extension with Counter Support  - 2-3 x daily - 7 x weekly - 1 sets - 10 reps ASSESSMENT:  CLINICAL IMPRESSION: Patient is pleased with current functional level. As of today, has met all but 1 set rehab goal.  Patient still has some pain in Right knee but overall demonstrates improved mobility compared to initial evaluation.  Discharge today; patient in agreement in with discharge plan.  OBJECTIVE IMPAIRMENTS Abnormal gait, decreased activity tolerance, decreased balance, decreased coordination, decreased endurance, decreased mobility, difficulty walking, decreased ROM, decreased strength, hypomobility, impaired perceived functional ability, impaired flexibility, improper body mechanics, and pain.   ACTIVITY LIMITATIONS cleaning, community activity, driving, meal prep, laundry, medication management, yard work, shopping, and church.   PERSONAL FACTORS Age are also affecting patient's functional outcome.    REHAB POTENTIAL: Good  CLINICAL DECISION MAKING: Stable/uncomplicated  EVALUATION COMPLEXITY: Low   GOALS: Goals reviewed with patient? No  SHORT TERM GOALS: Target date: 10/16/2021    patient will be independent with initial HEP Baseline:  10/24/21:  Reports compliance with HEP daily multiple Goal status: met   LONG TERM GOALS: Target date: 10/23/2021    Patient will be independent in self management strategies to improve quality of life and functional outcomes.   Baseline: 10/24/21:  Reports compliance with HEP daily multiple Goal status: met  2.  Patient will improve 5 x STS score from 1 min 17 sec to 45 sec to demonstrate improved functional mobility and increased lower extremity  strength.  Baseline: Eval:1 min 17 sec, 10/17/21 38 sec; 10/24/21:  39 seconds with Bil HHA Goal status: met  3.  Patient will increased her distance on the 2MWT from 96 ft to 150 ft to improve her access to home and community Baseline: 152 ft with SPC Goal status: met  4.  Patient will report decreased pain right knee from 7/10 to 3/10 to improve tolerance for standing and walking Baseline: 7/10 Goal status:  partially met    PLAN: PT FREQUENCY: 2x/week  PT DURATION: 3 weeks  PLANNED INTERVENTIONS: Therapeutic exercises, Therapeutic activity, Neuromuscular re-education, Balance training, Gait training, Patient/Family education, Joint manipulation, Joint mobilization, Stair training, Vestibular training, Canalith repositioning, DME instructions, Aquatic Therapy, Dry Needling, Electrical stimulation, Spinal manipulation, Spinal mobilization, Cryotherapy, Taping, Vasopneumatic device, Traction, Ultrasound, and Manual therapy  PLAN FOR NEXT SESSION:  discharge 3:07 PM, 10/31/21 Jonael Paradiso Small Manette Doto MPT [facility] [place] [phone]

## 2021-11-01 DIAGNOSIS — H401132 Primary open-angle glaucoma, bilateral, moderate stage: Secondary | ICD-10-CM | POA: Insufficient documentation

## 2021-11-02 ENCOUNTER — Ambulatory Visit (HOSPITAL_COMMUNITY): Payer: Medicare Other

## 2021-11-29 ENCOUNTER — Emergency Department (HOSPITAL_COMMUNITY): Payer: Medicare Other

## 2021-11-29 ENCOUNTER — Other Ambulatory Visit: Payer: Self-pay

## 2021-11-29 ENCOUNTER — Emergency Department (HOSPITAL_COMMUNITY)
Admission: EM | Admit: 2021-11-29 | Discharge: 2021-11-30 | Disposition: A | Discharge status: Home / Self Care | Payer: Medicare Other | Attending: Emergency Medicine | Admitting: Emergency Medicine

## 2021-11-29 ENCOUNTER — Encounter (HOSPITAL_COMMUNITY): Payer: Self-pay

## 2021-11-29 DIAGNOSIS — R1031 Right lower quadrant pain: Secondary | ICD-10-CM | POA: Insufficient documentation

## 2021-11-29 DIAGNOSIS — R112 Nausea with vomiting, unspecified: Secondary | ICD-10-CM | POA: Insufficient documentation

## 2021-11-29 DIAGNOSIS — R109 Unspecified abdominal pain: Secondary | ICD-10-CM

## 2021-11-29 LAB — COMPREHENSIVE METABOLIC PANEL
ALT: 13 U/L (ref 0–44)
AST: 19 U/L (ref 15–41)
Albumin: 3.7 g/dL (ref 3.5–5.0)
Alkaline Phosphatase: 63 U/L (ref 38–126)
Anion gap: 6 (ref 5–15)
BUN: 13 mg/dL (ref 8–23)
CO2: 21 mmol/L — ABNORMAL LOW (ref 22–32)
Calcium: 9.1 mg/dL (ref 8.9–10.3)
Chloride: 113 mmol/L — ABNORMAL HIGH (ref 98–111)
Creatinine, Ser: 0.83 mg/dL (ref 0.44–1.00)
GFR, Estimated: >60 mL/min (ref >60)
Glucose, Bld: 101 mg/dL — ABNORMAL HIGH (ref 70–99)
Potassium: 3.3 mmol/L — ABNORMAL LOW (ref 3.5–5.1)
Sodium: 140 mmol/L (ref 135–145)
Total Bilirubin: 1.1 mg/dL (ref 0.3–1.2)
Total Protein: 7.3 g/dL (ref 6.5–8.1)

## 2021-11-29 LAB — URINALYSIS, ROUTINE W REFLEX MICROSCOPIC
Bacteria, UA: NONE SEEN
Bilirubin Urine: NEGATIVE
Glucose, UA: NEGATIVE mg/dL
Ketones, ur: NEGATIVE mg/dL
Leukocytes,Ua: NEGATIVE
Nitrite: NEGATIVE
Protein, ur: 100 mg/dL — AB
RBC / HPF: >50 RBC/hpf — ABNORMAL HIGH (ref 0–5)
Specific Gravity, Urine: 1.011 (ref 1.005–1.030)
WBC, UA: >50 WBC/hpf — ABNORMAL HIGH (ref 0–5)
pH: 7 (ref 5.0–8.0)

## 2021-11-29 LAB — CBC WITH DIFFERENTIAL/PLATELET
Abs Immature Granulocytes: 0.02 10*3/uL (ref 0.00–0.07)
Basophils Absolute: 0 10*3/uL (ref 0.0–0.1)
Basophils Relative: 0 %
Eosinophils Absolute: 0 10*3/uL (ref 0.0–0.5)
Eosinophils Relative: 0 %
HCT: 45 % (ref 36.0–46.0)
Hemoglobin: 15 g/dL (ref 12.0–15.0)
Immature Granulocytes: 0 %
Lymphocytes Relative: 18 %
Lymphs Abs: 1.3 10*3/uL (ref 0.7–4.0)
MCH: 31.6 pg (ref 26.0–34.0)
MCHC: 33.3 g/dL (ref 30.0–36.0)
MCV: 94.7 fL (ref 80.0–100.0)
Monocytes Absolute: 0.6 10*3/uL (ref 0.1–1.0)
Monocytes Relative: 8 %
Neutro Abs: 5.3 10*3/uL (ref 1.7–7.7)
Neutrophils Relative %: 74 %
Platelets: 360 10*3/uL (ref 150–400)
RBC: 4.75 MIL/uL (ref 3.87–5.11)
RDW: 15.3 % (ref 11.5–15.5)
WBC: 7.2 10*3/uL (ref 4.0–10.5)
nRBC: 0 % (ref 0.0–0.2)

## 2021-11-29 LAB — LIPASE, BLOOD: Lipase: 33 U/L (ref 11–51)

## 2021-11-29 MED ORDER — HYDROCODONE-ACETAMINOPHEN 5-325 MG PO TABS
1.0000 | ORAL_TABLET | Freq: Once | ORAL | Status: AC
  Administered 2021-11-29: 1 via ORAL
  Filled 2021-11-29: qty 1

## 2021-11-29 MED ORDER — ONDANSETRON 4 MG PO TBDP
4.0000 mg | ORAL_TABLET | Freq: Once | ORAL | Status: AC
  Administered 2021-11-29: 4 mg via ORAL
  Filled 2021-11-29: qty 1

## 2021-11-29 NOTE — ED Provider Notes (Signed)
[facility]-EMERGENCY DEPT [facility] Emergency Department Provider Note MRN:  [id number]  Arrival date & time: 11/30/21     Chief Complaint   Flank Pain   History of Present Illness   Cheryl Swanson is a 88 y.o. year-old female with no pertinent past medical presenting to the ED with chief complaint of flank pain.  Right-sided flank pain with some radiation to the right lower quadrant.  Associated with some urinary frequency.  Pain was fairly severe about 5 hours ago but has subsided a bit.  Still 4 out of 10.  Was associated with nausea and vomiting.  No recent fever, no other complaints.  Review of Systems  A thorough review of systems was obtained and all systems are negative except as noted in the HPI and PMH.   Patient's Health History    Past Medical History:  Diagnosis Date   Glaucoma    Mitral valve disorder    Peptic ulcer    Rheumatoid arthritis(714.0)    Tricuspid valve disorder     Past Surgical History:  Procedure Laterality Date   ABDOMINAL HYSTERECTOMY  1971   HAND SURGERY  1980's   left    Family History  Problem Relation Age of Onset   Cancer Mother    Cancer Father    Cancer Brother    Cancer Brother    Cancer Son        prostate-remission    Social History   Socioeconomic History   Marital status: Widowed    Spouse name: Not on file   Number of children: Not on file   Years of education: Not on file   Highest education level: Not on file  Occupational History   Not on file  Tobacco Use   Smoking status: Never   Smokeless tobacco: Never  Vaping Use   Vaping Use: Never used  Substance and Sexual Activity   Alcohol use: No   Drug use: No   Sexual activity: Not on file  Other Topics Concern   Not on file  Social History Narrative   Not on file   Social Determinants of Health   Financial Resource Strain: Not on file  Food Insecurity: Not on file  Transportation Needs: Not on file  Physical Activity: Not on file  Stress: Not on  file  Social Connections: Not on file  Intimate Partner Violence: Not on file     Physical Exam   Vitals:   11/29/21 2337 11/30/21 0033  BP: 127/65 129/65  Pulse: (!) 57 (!) 48  Resp: 19 18  Temp:    SpO2: 99% 98%    CONSTITUTIONAL: Well-appearing, NAD NEURO/PSYCH:  Alert and oriented x 3, no focal deficits EYES:  eyes equal and reactive ENT/NECK:  no LAD, no JVD CARDIO: Regular rate, well-perfused, normal S1 and S2 PULM:  CTAB no wheezing or rhonchi GI/GU:  non-distended, non-tender MSK/SPINE:  No gross deformities, no edema SKIN:  no rash, atraumatic   *Additional and/or pertinent findings included in MDM below  Diagnostic and Interventional Summary    EKG Interpretation  Date/Time:  Thursday November 29 2021 22:19:08 EDT Ventricular Rate:  57 PR Interval:  177 QRS Duration: 102 QT Interval:  427 QTC Calculation: 416 R Axis:   79 Text Interpretation: Sinus rhythm Biatrial enlargement Abnrm T, consider ischemia, anterolateral lds Minimal ST elevation, inferior leads No significant change was found Confirmed by Kennis Carina [phone]) on 11/29/2021 11:00:04 PM       Labs Reviewed  Eartha Inch,  ROUTINE W REFLEX MICROSCOPIC - Abnormal; Notable for the following components:      Result Value   Color, Urine RED (*)    APPearance CLOUDY (*)    Hgb urine dipstick LARGE (*)    Protein, ur 100 (*)    RBC / HPF >50 (*)    WBC, UA >50 (*)    All other components within normal limits  COMPREHENSIVE METABOLIC PANEL - Abnormal; Notable for the following components:   Potassium 3.3 (*)    Chloride 113 (*)    CO2 21 (*)    Glucose, Bld 101 (*)    All other components within normal limits  LIPASE, BLOOD  CBC WITH DIFFERENTIAL/PLATELET    CT Renal Stone Study  Final Result      Medications  ondansetron (ZOFRAN-ODT) disintegrating tablet 4 mg (4 mg Oral Given 11/29/21 2327)  HYDROcodone-acetaminophen (NORCO/VICODIN) 5-325 MG per tablet 1 tablet (1 tablet Oral Given 11/29/21  2326)     Procedures  /  Critical Care Procedures  ED Course and Medical Decision Making  Initial Impression and Ddx Differential diagnosis includes kidney stone, pyelonephritis, diverticulitis, appendicitis.  Kidney stone is favored, hematuria and white blood cells noted in the urine, awaiting CT.  Past medical/surgical history that increases complexity of ED encounter: Advanced age  Interpretation of Diagnostics I personally reviewed the laboratory assessment and my interpretation is as follows: No significant blood count or electrolyte disturbance.  Urinalysis revealing blood, white blood cells  CT abdomen is overall not acute.  Some fullness to the right collecting system.  Patient Reassessment and Ultimate Disposition/Management     Pain is well controlled, work-up is reassuring.  Question recently passed stone versus pyelonephritis given the CT read, appropriate for discharge.  Patient management required discussion with the following services or consulting groups:  None  Complexity of Problems Addressed Acute illness or injury that poses threat of life of bodily function  Additional Data Reviewed and Analyzed Further history obtained from: Further history from spouse/family member  Additional Factors Impacting ED Encounter Risk Consideration of hospitalization  Elmer Sow. Pilar Plate, MD [facility] Emergency Medicine [facility] [email or web address]  Final Clinical Impressions(s) / ED Diagnoses     ICD-10-CM   1. Flank pain  R10.9       ED Discharge Orders          Ordered    cephALEXin (KEFLEX) 500 MG capsule  4 times daily        11/30/21 0050    HYDROcodone-acetaminophen (NORCO/VICODIN) 5-325 MG tablet  Every 4 hours PRN        11/30/21 0050             Discharge Instructions Discussed with and Provided to Patient:     Discharge Instructions      You were evaluated in the Emergency Department and after careful evaluation, we did not  find any emergent condition requiring admission or further testing in the hospital.  Your exam/testing today was overall reassuring.  Symptoms may be due to a recently passed kidney stone or a kidney infection.  Please take the Keflex antibiotic as directed.  Use Tylenol at home for pain.  You can use the Norco tablet for more significant pain  Please return to the Emergency Department if you experience any worsening of your condition such as worsening pain or fever.  Thank you for allowing [place] to be a part of your care.        Kennis Carina  M, MD 11/30/21 [phone]

## 2021-11-29 NOTE — ED Triage Notes (Signed)
Pt presents to ED with complaints of right flank pain, vomiting and urine frequency since 1400

## 2021-11-30 MED ORDER — CEPHALEXIN 500 MG PO CAPS: 500.0000 mg | ORAL_CAPSULE | Freq: Four times a day (QID) | ORAL | 0 refills | Status: AC

## 2021-11-30 MED ORDER — HYDROCODONE-ACETAMINOPHEN 5-325 MG PO TABS: 1.0000 | ORAL_TABLET | ORAL | 0 refills | Status: DC | PRN

## 2021-11-30 NOTE — Discharge Instructions (Signed)
You were evaluated in the Emergency Department and after careful evaluation, we did not find any emergent condition requiring admission or further testing in the hospital.  Your exam/testing today was overall reassuring.  Symptoms may be due to a recently passed kidney stone or a kidney infection.  Please take the Keflex antibiotic as directed.  Use Tylenol at home for pain.  You can use the Norco tablet for more significant pain  Please return to the Emergency Department if you experience any worsening of your condition such as worsening pain or fever.  Thank you for allowing [place] to be a part of your care.

## 2021-12-02 LAB — URINE CULTURE: Culture: 20000 — AB

## 2021-12-03 ENCOUNTER — Telehealth: Payer: Self-pay

## 2021-12-03 NOTE — Telephone Encounter (Signed)
Post ED Visit - Positive Culture Follow-up  Culture report reviewed by antimicrobial stewardship pharmacist: [facility] [x]  , Wilburn Cornelia.D. []  [place], Pharm.D., BCPS AQ-ID []  , Pharm.D., BCPS []  Celedonio Miyamoto, Pharm.D., BCPS []  Driscoll, Garvin Fila.D., BCPS, AAHIVP []  , Pharm.D., BCPS, AAHIVP []  Georgina Pillion, PharmD, BCPS []  , PharmD, BCPS []  [place], PharmD, BCPS []  [address], PharmD []  , PharmD, BCPS []  Estella Husk, PharmD  Pharmacy Team []  Lysle Pearl, PharmD []  , PharmD []  Phillips Climes, PharmD []  , Rph []  Agapito Games) , PharmD []  Verlan Friends, PharmD []  , PharmD []  Mervyn Gay, PharmD []  , PharmD []  Vinnie Level, PharmD []  [facility], PharmD []  , PharmD []  Len Childs, PharmD   Positive urine culture Treated with Cephalexin, organism sensitive to the same and no further patient follow-up is required at this time.  12/03/2021, 10:04 AM

## 2022-01-15 ENCOUNTER — Other Ambulatory Visit (HOSPITAL_COMMUNITY)
Admission: RE | Admit: 2022-01-15 | Discharge: 2022-01-15 | Disposition: A | Discharge status: Home / Self Care | Payer: Medicare Other | Source: Ambulatory Visit | Attending: Family Medicine | Admitting: Family Medicine

## 2022-01-15 DIAGNOSIS — Z7901 Long term (current) use of anticoagulants: Secondary | ICD-10-CM | POA: Insufficient documentation

## 2022-01-15 LAB — PROTIME-INR
INR: 5.1 (ref 0.8–1.2)
Prothrombin Time: 46.8 seconds — ABNORMAL HIGH (ref 11.4–15.2)

## 2022-02-27 ENCOUNTER — Other Ambulatory Visit (HOSPITAL_COMMUNITY)
Admission: RE | Admit: 2022-02-27 | Discharge: 2022-02-27 | Disposition: A | Discharge status: Home / Self Care | Payer: Medicare Other | Source: Ambulatory Visit | Attending: Family Medicine | Admitting: Family Medicine

## 2022-02-27 DIAGNOSIS — Z7901 Long term (current) use of anticoagulants: Secondary | ICD-10-CM | POA: Insufficient documentation

## 2022-02-27 LAB — PROTIME-INR
INR: 2 — ABNORMAL HIGH (ref 0.8–1.2)
Prothrombin Time: 22.8 seconds — ABNORMAL HIGH (ref 11.4–15.2)

## 2022-05-14 ENCOUNTER — Other Ambulatory Visit (HOSPITAL_COMMUNITY)
Admission: RE | Admit: 2022-05-14 | Discharge: 2022-05-14 | Disposition: A | Discharge status: Home / Self Care | Payer: Medicare Other | Source: Ambulatory Visit | Attending: Family Medicine | Admitting: Family Medicine

## 2022-05-14 DIAGNOSIS — Z7901 Long term (current) use of anticoagulants: Secondary | ICD-10-CM | POA: Diagnosis present

## 2022-05-14 LAB — PROTIME-INR
INR: 2 — ABNORMAL HIGH (ref 0.8–1.2)
Prothrombin Time: 22.4 seconds — ABNORMAL HIGH (ref 11.4–15.2)

## 2022-06-20 ENCOUNTER — Other Ambulatory Visit (HOSPITAL_COMMUNITY)
Admission: RE | Admit: 2022-06-20 | Discharge: 2022-06-20 | Disposition: A | Discharge status: Home / Self Care | Payer: Medicare Other | Source: Ambulatory Visit | Attending: Family Medicine | Admitting: Family Medicine

## 2022-06-20 DIAGNOSIS — Z7901 Long term (current) use of anticoagulants: Secondary | ICD-10-CM | POA: Insufficient documentation

## 2022-06-20 LAB — PROTIME-INR
INR: 2.6 — ABNORMAL HIGH (ref 0.8–1.2)
Prothrombin Time: 27.7 seconds — ABNORMAL HIGH (ref 11.4–15.2)

## 2022-07-03 ENCOUNTER — Other Ambulatory Visit (HOSPITAL_COMMUNITY): Payer: Self-pay | Admitting: Family Medicine

## 2022-07-03 DIAGNOSIS — M5442 Lumbago with sciatica, left side: Secondary | ICD-10-CM

## 2022-07-09 ENCOUNTER — Ambulatory Visit (HOSPITAL_COMMUNITY)
Admission: RE | Admit: 2022-07-09 | Discharge: 2022-07-09 | Disposition: A | Discharge status: Home / Self Care | Payer: Medicare Other | Source: Ambulatory Visit | Attending: Family Medicine | Admitting: Family Medicine

## 2022-07-09 DIAGNOSIS — M5441 Lumbago with sciatica, right side: Secondary | ICD-10-CM | POA: Insufficient documentation

## 2022-07-09 DIAGNOSIS — M5442 Lumbago with sciatica, left side: Secondary | ICD-10-CM | POA: Diagnosis not present

## 2022-07-22 ENCOUNTER — Other Ambulatory Visit (HOSPITAL_COMMUNITY)
Admission: RE | Admit: 2022-07-22 | Discharge: 2022-07-22 | Disposition: A | Discharge status: Home / Self Care | Payer: Medicare Other | Source: Ambulatory Visit | Attending: Family Medicine | Admitting: Family Medicine

## 2022-07-22 DIAGNOSIS — Z7901 Long term (current) use of anticoagulants: Secondary | ICD-10-CM | POA: Diagnosis present

## 2022-07-22 LAB — PROTIME-INR
INR: 2.8 — ABNORMAL HIGH (ref 0.8–1.2)
Prothrombin Time: 29.4 seconds — ABNORMAL HIGH (ref 11.4–15.2)

## 2022-07-25 ENCOUNTER — Other Ambulatory Visit (HOSPITAL_COMMUNITY): Payer: Medicare Other

## 2022-07-25 ENCOUNTER — Ambulatory Visit (HOSPITAL_COMMUNITY)
Admission: RE | Admit: 2022-07-25 | Discharge: 2022-07-25 | Disposition: A | Discharge status: Home / Self Care | Payer: Medicare Other | Source: Ambulatory Visit | Attending: Family Medicine | Admitting: Family Medicine

## 2022-07-25 DIAGNOSIS — M5441 Lumbago with sciatica, right side: Secondary | ICD-10-CM | POA: Diagnosis present

## 2022-07-25 DIAGNOSIS — M5442 Lumbago with sciatica, left side: Secondary | ICD-10-CM | POA: Diagnosis not present

## 2022-07-30 ENCOUNTER — Other Ambulatory Visit (HOSPITAL_COMMUNITY)
Admission: RE | Admit: 2022-07-30 | Discharge: 2022-07-30 | Disposition: A | Discharge status: Home / Self Care | Payer: Medicare Other | Source: Ambulatory Visit | Attending: Family Medicine | Admitting: Family Medicine

## 2022-07-30 ENCOUNTER — Other Ambulatory Visit (HOSPITAL_COMMUNITY): Payer: Self-pay | Admitting: Family Medicine

## 2022-07-30 ENCOUNTER — Ambulatory Visit (HOSPITAL_COMMUNITY)
Admission: RE | Admit: 2022-07-30 | Discharge: 2022-07-30 | Disposition: A | Discharge status: Home / Self Care | Payer: Medicare Other | Source: Ambulatory Visit | Attending: Family Medicine | Admitting: Family Medicine

## 2022-07-30 DIAGNOSIS — R7989 Other specified abnormal findings of blood chemistry: Secondary | ICD-10-CM | POA: Insufficient documentation

## 2022-07-30 DIAGNOSIS — M7989 Other specified soft tissue disorders: Secondary | ICD-10-CM | POA: Diagnosis present

## 2022-07-30 DIAGNOSIS — R600 Localized edema: Secondary | ICD-10-CM | POA: Insufficient documentation

## 2022-07-30 LAB — CBC
HCT: 41.6 % (ref 36.0–46.0)
Hemoglobin: 13.6 g/dL (ref 12.0–15.0)
MCH: 29.9 pg (ref 26.0–34.0)
MCHC: 32.7 g/dL (ref 30.0–36.0)
MCV: 91.4 fL (ref 80.0–100.0)
Platelets: 471 10*3/uL — ABNORMAL HIGH (ref 150–400)
RBC: 4.55 MIL/uL (ref 3.87–5.11)
RDW: 13.6 % (ref 11.5–15.5)
WBC: 6.3 10*3/uL (ref 4.0–10.5)
nRBC: 0 % (ref 0.0–0.2)

## 2022-07-30 LAB — COMPREHENSIVE METABOLIC PANEL
ALT: 10 U/L (ref 0–44)
AST: 22 U/L (ref 15–41)
Albumin: 3.3 g/dL — ABNORMAL LOW (ref 3.5–5.0)
Alkaline Phosphatase: 57 U/L (ref 38–126)
Anion gap: 8 (ref 5–15)
BUN: 9 mg/dL (ref 8–23)
CO2: 26 mmol/L (ref 22–32)
Calcium: 9.4 mg/dL (ref 8.9–10.3)
Chloride: 104 mmol/L (ref 98–111)
Creatinine, Ser: 0.66 mg/dL (ref 0.44–1.00)
GFR, Estimated: >60 mL/min (ref >60)
Glucose, Bld: 91 mg/dL (ref 70–99)
Potassium: 3.3 mmol/L — ABNORMAL LOW (ref 3.5–5.1)
Sodium: 138 mmol/L (ref 135–145)
Total Bilirubin: 1.3 mg/dL — ABNORMAL HIGH (ref 0.3–1.2)
Total Protein: 7.5 g/dL (ref 6.5–8.1)

## 2022-07-30 LAB — BRAIN NATRIURETIC PEPTIDE: B Natriuretic Peptide: 26 pg/mL (ref 0.0–100.0)

## 2022-08-02 ENCOUNTER — Other Ambulatory Visit (HOSPITAL_COMMUNITY): Payer: Self-pay | Admitting: Nurse Practitioner

## 2022-08-02 ENCOUNTER — Ambulatory Visit (HOSPITAL_COMMUNITY)
Admission: RE | Admit: 2022-08-02 | Discharge: 2022-08-02 | Disposition: A | Discharge status: Home / Self Care | Payer: Medicare Other | Source: Ambulatory Visit | Attending: Nurse Practitioner | Admitting: Nurse Practitioner

## 2022-08-02 DIAGNOSIS — Z86718 Personal history of other venous thrombosis and embolism: Secondary | ICD-10-CM

## 2022-08-02 DIAGNOSIS — R600 Localized edema: Secondary | ICD-10-CM | POA: Diagnosis present

## 2022-09-03 DIAGNOSIS — M170 Bilateral primary osteoarthritis of knee: Secondary | ICD-10-CM | POA: Insufficient documentation

## 2022-09-19 ENCOUNTER — Other Ambulatory Visit (HOSPITAL_COMMUNITY)
Admission: RE | Admit: 2022-09-19 | Discharge: 2022-09-19 | Disposition: A | Discharge status: Home / Self Care | Payer: Medicare Other | Source: Ambulatory Visit | Attending: Family Medicine | Admitting: Family Medicine

## 2022-09-19 ENCOUNTER — Other Ambulatory Visit (HOSPITAL_COMMUNITY): Payer: Self-pay | Admitting: Family Medicine

## 2022-09-19 DIAGNOSIS — Z7901 Long term (current) use of anticoagulants: Secondary | ICD-10-CM | POA: Diagnosis not present

## 2022-09-19 DIAGNOSIS — R0602 Shortness of breath: Secondary | ICD-10-CM | POA: Diagnosis not present

## 2022-09-19 DIAGNOSIS — R5381 Other malaise: Secondary | ICD-10-CM | POA: Diagnosis present

## 2022-09-19 DIAGNOSIS — R600 Localized edema: Secondary | ICD-10-CM

## 2022-09-19 LAB — COMPREHENSIVE METABOLIC PANEL
ALT: 10 U/L (ref 0–44)
AST: 21 U/L (ref 15–41)
Albumin: 3 g/dL — ABNORMAL LOW (ref 3.5–5.0)
Alkaline Phosphatase: 59 U/L (ref 38–126)
Anion gap: 7 (ref 5–15)
BUN: 9 mg/dL (ref 8–23)
CO2: 25 mmol/L (ref 22–32)
Calcium: 9 mg/dL (ref 8.9–10.3)
Chloride: 101 mmol/L (ref 98–111)
Creatinine, Ser: 0.6 mg/dL (ref 0.44–1.00)
GFR, Estimated: >60 mL/min (ref >60)
Glucose, Bld: 104 mg/dL — ABNORMAL HIGH (ref 70–99)
Potassium: 3.6 mmol/L (ref 3.5–5.1)
Sodium: 133 mmol/L — ABNORMAL LOW (ref 135–145)
Total Bilirubin: 1 mg/dL (ref 0.3–1.2)
Total Protein: 7 g/dL (ref 6.5–8.1)

## 2022-09-19 LAB — CBC WITH DIFFERENTIAL/PLATELET
Abs Immature Granulocytes: 0.01 10*3/uL (ref 0.00–0.07)
Basophils Absolute: 0 10*3/uL (ref 0.0–0.1)
Basophils Relative: 1 %
Eosinophils Absolute: 0.2 10*3/uL (ref 0.0–0.5)
Eosinophils Relative: 3 %
HCT: 37.5 % (ref 36.0–46.0)
Hemoglobin: 12.1 g/dL (ref 12.0–15.0)
Immature Granulocytes: 0 %
Lymphocytes Relative: 25 %
Lymphs Abs: 1.3 10*3/uL (ref 0.7–4.0)
MCH: 30 pg (ref 26.0–34.0)
MCHC: 32.3 g/dL (ref 30.0–36.0)
MCV: 92.8 fL (ref 80.0–100.0)
Monocytes Absolute: 0.8 10*3/uL (ref 0.1–1.0)
Monocytes Relative: 16 %
Neutro Abs: 2.8 10*3/uL (ref 1.7–7.7)
Neutrophils Relative %: 55 %
Platelets: 396 10*3/uL (ref 150–400)
RBC: 4.04 MIL/uL (ref 3.87–5.11)
RDW: 14.7 % (ref 11.5–15.5)
WBC: 5.1 10*3/uL (ref 4.0–10.5)
nRBC: 0 % (ref 0.0–0.2)

## 2022-09-19 LAB — PROTIME-INR
INR: 3.6 — ABNORMAL HIGH (ref 0.8–1.2)
Prothrombin Time: 35.3 seconds — ABNORMAL HIGH (ref 11.4–15.2)

## 2022-09-19 LAB — BRAIN NATRIURETIC PEPTIDE: B Natriuretic Peptide: 65 pg/mL (ref 0.0–100.0)

## 2022-09-20 ENCOUNTER — Ambulatory Visit (HOSPITAL_BASED_OUTPATIENT_CLINIC_OR_DEPARTMENT_OTHER)
Admission: RE | Admit: 2022-09-20 | Discharge: 2022-09-20 | Disposition: A | Discharge status: Home / Self Care | Payer: Medicare Other | Source: Ambulatory Visit

## 2022-09-20 DIAGNOSIS — E785 Hyperlipidemia, unspecified: Secondary | ICD-10-CM | POA: Insufficient documentation

## 2022-09-20 DIAGNOSIS — R600 Localized edema: Secondary | ICD-10-CM | POA: Insufficient documentation

## 2022-09-20 DIAGNOSIS — K56609 Unspecified intestinal obstruction, unspecified as to partial versus complete obstruction: Secondary | ICD-10-CM | POA: Diagnosis not present

## 2022-09-20 LAB — ECHOCARDIOGRAM COMPLETE
AR max vel: 2.5 cm2
AV Area VTI: 2.23 cm2
AV Area mean vel: 2.52 cm2
AV Mean grad: 5 mmHg
AV Peak grad: 9 mmHg
Ao pk vel: 1.5 m/s
Area-P 1/2: 2.5 cm2
Calc EF: 59.9 %
Est EF: 55
MV VTI: 1.54 cm2
S' Lateral: 2.5 cm
Single Plane A2C EF: 64 %
Single Plane A4C EF: 57.8 %

## 2022-09-20 NOTE — Progress Notes (Signed)
  Echocardiogram 2D Echocardiogram has been performed.  Janalyn Harder 09/20/2022, 10:54 AM

## 2022-09-23 ENCOUNTER — Inpatient Hospital Stay (HOSPITAL_COMMUNITY): Payer: Medicare Other

## 2022-09-23 ENCOUNTER — Inpatient Hospital Stay (HOSPITAL_COMMUNITY)
Admission: EM | Admit: 2022-09-23 | Discharge: 2022-09-26 | DRG: 390 | Disposition: A | Discharge status: Home Health | Payer: Medicare Other | Attending: Family Medicine | Admitting: Family Medicine

## 2022-09-23 ENCOUNTER — Encounter (HOSPITAL_COMMUNITY): Payer: Self-pay | Admitting: Emergency Medicine

## 2022-09-23 ENCOUNTER — Emergency Department (HOSPITAL_COMMUNITY): Payer: Medicare Other

## 2022-09-23 DIAGNOSIS — J449 Chronic obstructive pulmonary disease, unspecified: Secondary | ICD-10-CM | POA: Diagnosis present

## 2022-09-23 DIAGNOSIS — R791 Abnormal coagulation profile: Secondary | ICD-10-CM | POA: Diagnosis not present

## 2022-09-23 DIAGNOSIS — E785 Hyperlipidemia, unspecified: Secondary | ICD-10-CM | POA: Diagnosis present

## 2022-09-23 DIAGNOSIS — M069 Rheumatoid arthritis, unspecified: Secondary | ICD-10-CM | POA: Diagnosis present

## 2022-09-23 DIAGNOSIS — K56609 Unspecified intestinal obstruction, unspecified as to partial versus complete obstruction: Principal | ICD-10-CM | POA: Diagnosis present

## 2022-09-23 DIAGNOSIS — Z86718 Personal history of other venous thrombosis and embolism: Secondary | ICD-10-CM

## 2022-09-23 DIAGNOSIS — Z86711 Personal history of pulmonary embolism: Secondary | ICD-10-CM | POA: Diagnosis not present

## 2022-09-23 DIAGNOSIS — Z90710 Acquired absence of both cervix and uterus: Secondary | ICD-10-CM

## 2022-09-23 DIAGNOSIS — Z79899 Other long term (current) drug therapy: Secondary | ICD-10-CM

## 2022-09-23 DIAGNOSIS — M0579 Rheumatoid arthritis with rheumatoid factor of multiple sites without organ or systems involvement: Secondary | ICD-10-CM

## 2022-09-23 DIAGNOSIS — Z7901 Long term (current) use of anticoagulants: Secondary | ICD-10-CM

## 2022-09-23 LAB — COMPREHENSIVE METABOLIC PANEL
ALT: 10 U/L (ref 0–44)
AST: 19 U/L (ref 15–41)
Albumin: 3 g/dL — ABNORMAL LOW (ref 3.5–5.0)
Alkaline Phosphatase: 54 U/L (ref 38–126)
Anion gap: 8 (ref 5–15)
BUN: 10 mg/dL (ref 8–23)
CO2: 24 mmol/L (ref 22–32)
Calcium: 8.9 mg/dL (ref 8.9–10.3)
Chloride: 105 mmol/L (ref 98–111)
Creatinine, Ser: 0.56 mg/dL (ref 0.44–1.00)
GFR, Estimated: >60 mL/min (ref >60)
Glucose, Bld: 114 mg/dL — ABNORMAL HIGH (ref 70–99)
Potassium: 3.4 mmol/L — ABNORMAL LOW (ref 3.5–5.1)
Sodium: 137 mmol/L (ref 135–145)
Total Bilirubin: 1 mg/dL (ref 0.3–1.2)
Total Protein: 7 g/dL (ref 6.5–8.1)

## 2022-09-23 LAB — CBC WITH DIFFERENTIAL/PLATELET
Abs Immature Granulocytes: 0.02 10*3/uL (ref 0.00–0.07)
Basophils Absolute: 0 10*3/uL (ref 0.0–0.1)
Basophils Relative: 0 %
Eosinophils Absolute: 0 10*3/uL (ref 0.0–0.5)
Eosinophils Relative: 0 %
HCT: 37.5 % (ref 36.0–46.0)
Hemoglobin: 12.1 g/dL (ref 12.0–15.0)
Immature Granulocytes: 0 %
Lymphocytes Relative: 15 %
Lymphs Abs: 0.9 10*3/uL (ref 0.7–4.0)
MCH: 30 pg (ref 26.0–34.0)
MCHC: 32.3 g/dL (ref 30.0–36.0)
MCV: 92.8 fL (ref 80.0–100.0)
Monocytes Absolute: 0.3 10*3/uL (ref 0.1–1.0)
Monocytes Relative: 5 %
Neutro Abs: 4.9 10*3/uL (ref 1.7–7.7)
Neutrophils Relative %: 80 %
Platelets: 423 10*3/uL — ABNORMAL HIGH (ref 150–400)
RBC: 4.04 MIL/uL (ref 3.87–5.11)
RDW: 14.5 % (ref 11.5–15.5)
WBC: 6.2 10*3/uL (ref 4.0–10.5)
nRBC: 0 % (ref 0.0–0.2)

## 2022-09-23 LAB — PROTIME-INR
INR: 4.6 (ref 0.8–1.2)
Prothrombin Time: 42.8 seconds — ABNORMAL HIGH (ref 11.4–15.2)

## 2022-09-23 LAB — LIPASE, BLOOD: Lipase: 39 U/L (ref 11–51)

## 2022-09-23 MED ORDER — ONDANSETRON HCL 4 MG/2ML IJ SOLN
4.0000 mg | Freq: Once | INTRAMUSCULAR | Status: AC
  Administered 2022-09-23: 4 mg via INTRAVENOUS
  Filled 2022-09-23: qty 2

## 2022-09-23 MED ORDER — IOHEXOL 300 MG/ML  SOLN
75.0000 mL | Freq: Once | INTRAMUSCULAR | Status: AC | PRN
  Administered 2022-09-23: 75 mL via INTRAVENOUS

## 2022-09-23 MED ORDER — ONDANSETRON HCL 4 MG PO TABS: 4.0000 mg | ORAL_TABLET | Freq: Four times a day (QID) | ORAL | Status: DC | PRN

## 2022-09-23 MED ORDER — ACETAMINOPHEN 650 MG RE SUPP: 650.0000 mg | Freq: Four times a day (QID) | RECTAL | Status: DC | PRN

## 2022-09-23 MED ORDER — ONDANSETRON HCL 4 MG/2ML IJ SOLN
4.0000 mg | Freq: Four times a day (QID) | INTRAMUSCULAR | Status: DC | PRN
  Administered 2022-09-23: 4 mg via INTRAVENOUS
  Filled 2022-09-23: qty 2

## 2022-09-23 MED ORDER — SODIUM CHLORIDE 0.9 % IV BOLUS
500.0000 mL | Freq: Once | INTRAVENOUS | Status: AC
  Administered 2022-09-23: 500 mL via INTRAVENOUS

## 2022-09-23 MED ORDER — POTASSIUM CHLORIDE IN NACL 20-0.9 MEQ/L-% IV SOLN: INTRAVENOUS | Status: DC

## 2022-09-23 MED ORDER — ACETAMINOPHEN 325 MG PO TABS: 650.0000 mg | ORAL_TABLET | Freq: Four times a day (QID) | ORAL | Status: DC | PRN

## 2022-09-23 MED ORDER — PANTOPRAZOLE SODIUM 40 MG IV SOLR
40.0000 mg | Freq: Once | INTRAVENOUS | Status: AC
  Administered 2022-09-23: 40 mg via INTRAVENOUS
  Filled 2022-09-23: qty 10

## 2022-09-23 NOTE — Progress Notes (Signed)
NG tube advanced 5 cm per Dr. Henreitta Leber.  Had vomited 100 mls prior so not much output when connected to suction.

## 2022-09-23 NOTE — H&P (Signed)
History and Physical    Patient: Cheryl Swanson JYN:[id number] DOB: [birth year] DOA: 09/23/2022 DOS: the patient was seen and examined on 09/23/2022 PCP: [name], MD  Patient coming from: Home  Chief Complaint:  Chief Complaint  Patient presents with   Abdominal Pain   HPI: Cheryl Swanson is a 89 year old female with a history of recurrent DVT/PE, glaucoma, hyperlipidemia, rheumatoid arthritis, COPD presenting with 1 day history of abdominal pain with nausea and vomiting.  The patient states that she has been in her usual state of health before she began developing the above symptoms in the late evening on 09/22/2022.  She had innumerable episodes of vomiting and continued to vomit throughout the night and into this morning.  Because of continued abdominal pain, she presented for further evaluation and treatment.  The patient states that she did have a small bowel movement on the evening of 09/22/2022 with a little bit of relief, but her symptoms subsequently returned. Patient denies fevers, chills, headache, chest pain, dyspnea, nausea, vomiting, diarrhea, dysuria, hematuria, hematochezia, and melena.  The patient denies any recent surgeries.  She denies any new medications in the past month.  She denies any over-the-counter medicines that are new. In the ED, the patient was afebrile and hemodynamically stable with oxygen saturation 100% room air.  BMP showed a sodium 137, potassium 3.4, bicarbonate 24, serum creatinine 0.56.  LFTs were unremarkable.  WBC 6.2, hemoglobin 12.1, platelets 423,000.  CT of the abdomen and pelvis showed fluid-filled dilated small bowel loops consistent with obstruction with transition point in the pelvis.  There was mild inflammation of the pelvic small bowel loops.  General surgery was consulted.  The patient was started on IV fluids.   Review of Systems: As mentioned in the history of present illness. All other systems reviewed and are negative. Past Medical  History:  Diagnosis Date   Glaucoma    Mitral valve disorder    Peptic ulcer    Rheumatoid arthritis(714.0)    Tricuspid valve disorder    Past Surgical History:  Procedure Laterality Date   ABDOMINAL HYSTERECTOMY  1971   HAND SURGERY  1980's   left   Social History:  reports that she has never smoked. She has never used smokeless tobacco. She reports that she does not drink alcohol and does not use drugs.  No Known Allergies  Family History  Problem Relation Age of Onset   Cancer Mother    Cancer Father    Cancer Brother    Cancer Brother    Cancer Son        prostate-remission    Prior to Admission medications   Medication Sig Start Date End Date Taking? Authorizing Provider  albuterol (PROVENTIL HFA;VENTOLIN HFA) 108 (90 Base) MCG/ACT inhaler Inhale 2 puffs into the lungs every 4 (four) hours as needed for wheezing or shortness of breath. 07/13/15  Yes Eber Hong, MD  calcium-vitamin D (OSCAL WITH D) 500-200 MG-UNIT per tablet Take 1 tablet by mouth 2 (two) times daily.   Yes [provider]  carboxymethylcellulose (REFRESH PLUS) 0.5 % SOLN Place 1 drop into both eyes 2 (two) times daily as needed (dry eyes).   Yes [provider]  dorzolamide-timolol (COSOPT) 2-0.5 % ophthalmic solution Place 1 drop into the right eye 2 (two) times daily. 05/06/22  Yes [provider]  furosemide (LASIX) 40 MG tablet Take 40 mg by mouth daily.   Yes [provider]  HYDROcodone-acetaminophen (NORCO/VICODIN) 5-325 MG tablet Take 1  tablet by mouth every 4 (four) hours as needed. Patient taking differently: Take 2 tablets by mouth every 6 (six) hours as needed for moderate pain. 11/30/21  Yes Sabas Sous, MD  latanoprost (XALATAN) 0.005 % ophthalmic solution Place 1 drop into both eyes at bedtime.    Yes [provider]  potassium chloride SA (KLOR-CON M) 20 MEQ tablet Take 20 mEq by mouth daily. 07/31/22  Yes [provider]   simvastatin (ZOCOR) 20 MG tablet Take 20 mg by mouth every evening.  10/05/12  Yes [provider]  warfarin (COUMADIN) 5 MG tablet Take 5 mg by mouth daily at 4 PM. 03/17/20  Yes [provider]  LORazepam (ATIVAN) 0.5 MG tablet Take 0.5-1 mg by mouth at bedtime. Patient not taking: Reported on 09/23/2022 03/07/20   [provider]  warfarin (COUMADIN) 2.5 MG tablet Take 1 tablet (2.5 mg total) by mouth daily at 4 PM. Patient not taking: Reported on 09/23/2022 08/19/19   Cleora Fleet, MD    Physical Exam: Vitals:   09/23/22 0830 09/23/22 0930 09/23/22 1100 09/23/22 1130  BP: 114/60 120/70 125/70 128/72  Pulse: 75 80 83 80  Resp:    (!) 24  Temp:      TempSrc:      SpO2: 100% 100% 97% 98%  Weight:      Height:       GENERAL:  A&O x 3, NAD, well developed, cooperative, follows commands HEENT: [place]/AT, No thrush, No icterus, No oral ulcers Neck:  No neck mass, No meningismus, soft, supple CV: RRR, no S3, no S4, no rub, no JVD Lungs: Bibasal crackles.  No wheezing.  Good air movement Abd: soft/mild diffuse tenderness +BS, nondistended Ext: No edema, no lymphangitis, no cyanosis, no rashes Neuro:  CN II-XII intact, strength 4/5 in RUE, RLE, strength 4/5 LUE, LLE; sensation intact bilateral; no dysmetria; babinski equivocal  Data Reviewed: Data reviewed above in the history  Assessment and Plan: Small bowel obstruction -09/23/2022 CT abdomen as discussed above -General surgery consulted -Planning for NG decompression -Start IV fluids  Supratherapeutic INR -Presented with INR 4 point -No recent new medications -Likely due to poor oral intake -As the patient is clinically stable without any signs of active bleed,, plan to allow INR to drift down unless the patient needs operative intervention sooner -Hold warfarin  Recurrent DVT/PE -Holding warfarin as discussed above -Plan IV heparin when INR is subtherapeutic  Rheumatoid arthritis -Patient states  that her PCP stopped methotrexate about 5 months prior to this admission  Hyperlipidemia -Restart statin once able to tolerate p.o.     Advance Care Planning: FULL  Consults: General surgery  Family Communication: Daughter updated 09/23/2022  Severity of Illness: The appropriate patient status for this patient is INPATIENT. Inpatient status is judged to be reasonable and necessary in order to provide the required intensity of service to ensure the patient's safety. The patient's presenting symptoms, physical exam findings, and initial radiographic and laboratory data in the context of their chronic comorbidities is felt to place them at high risk for further clinical deterioration. Furthermore, it is not anticipated that the patient will be medically stable for discharge from the hospital within 2 midnights of admission.   * I certify that at the point of admission it is my clinical judgment that the patient will require inpatient hospital care spanning beyond 2 midnights from the point of admission due to high intensity of service, high risk for further deterioration and high  frequency of surveillance required.*  Author: Catarina Hartshorn, MD 09/23/2022 11:51 AM  For on call review [email or web address].

## 2022-09-23 NOTE — Consult Note (Signed)
[facility] Consult  Reason for Consult: SBO  Referring Physician:  Dr. Estell Harpin ED   Chief Complaint   Abdominal Pain     HPI: Cheryl Swanson is a 89 y.o. female with reported nausea/vomiting and abdominal pain. She says she had her last BM today. She had a hysterectomy 30+ years ago and a SBO around that time. She has not had once since that time. She was worked up in the ED and found to have a SBO on CT scan. She was also found to be supratherapeutic on her coumadin.  Past Medical History:  Diagnosis Date   Glaucoma    Mitral valve disorder    Peptic ulcer    Rheumatoid arthritis(714.0)    Tricuspid valve disorder     Past Surgical History:  Procedure Laterality Date   ABDOMINAL HYSTERECTOMY  1971   HAND SURGERY  1980's   left    Family History  Problem Relation Age of Onset   Cancer Mother    Cancer Father    Cancer Brother    Cancer Brother    Cancer Son        prostate-remission    Social History   Tobacco Use   Smoking status: Never   Smokeless tobacco: Never  Vaping Use   Vaping Use: Never used  Substance Use Topics   Alcohol use: No   Drug use: No    Medications: I have reviewed the patient's current medications. Prior to Admission:  Medications Prior to Admission  Medication Sig Dispense Refill Last Dose   albuterol (PROVENTIL HFA;VENTOLIN HFA) 108 (90 Base) MCG/ACT inhaler Inhale 2 puffs into the lungs every 4 (four) hours as needed for wheezing or shortness of breath. 1 Inhaler 3 unk   calcium-vitamin D (OSCAL WITH D) 500-200 MG-UNIT per tablet Take 1 tablet by mouth 2 (two) times daily.   09/22/2022   carboxymethylcellulose (REFRESH PLUS) 0.5 % SOLN Place 1 drop into both eyes 2 (two) times daily as needed (dry eyes).   09/22/2022   dorzolamide-timolol (COSOPT) 2-0.5 % ophthalmic solution Place 1 drop into the right eye 2 (two) times daily.   09/22/2022   furosemide (LASIX) 40 MG tablet Take 40 mg by mouth daily.   09/22/2022    HYDROcodone-acetaminophen (NORCO/VICODIN) 5-325 MG tablet Take 1 tablet by mouth every 4 (four) hours as needed. (Patient taking differently: Take 2 tablets by mouth every 6 (six) hours as needed for moderate pain.) 6 tablet 0 09/22/2022   latanoprost (XALATAN) 0.005 % ophthalmic solution Place 1 drop into both eyes at bedtime.    09/22/2022   potassium chloride SA (KLOR-CON M) 20 MEQ tablet Take 20 mEq by mouth daily.   09/22/2022   simvastatin (ZOCOR) 20 MG tablet Take 20 mg by mouth every evening.    09/22/2022   warfarin (COUMADIN) 5 MG tablet Take 5 mg by mouth daily at 4 PM.   09/22/2022 at 1600   LORazepam (ATIVAN) 0.5 MG tablet Take 0.5-1 mg by mouth at bedtime. (Patient not taking: Reported on 09/23/2022)   Not Taking   warfarin (COUMADIN) 2.5 MG tablet Take 1 tablet (2.5 mg total) by mouth daily at 4 PM. (Patient not taking: Reported on 09/23/2022) 30 tablet 0 Not Taking   Scheduled: Continuous:  0.9 % NaCl with KCl 20 mEq / L 75 mL/hr at 09/23/22 1344   ZOX:[id number] **OR** acetaminophen, ondansetron **OR** ondansetron (ZOFRAN) IV  No Known Allergies   ROS:  A comprehensive review of systems  was negative except for: Gastrointestinal: positive for abdominal pain, nausea, and vomiting  Blood pressure 120/70, pulse 80, temperature 98.3 F (36.8 C), temperature source Oral, resp. rate 18, height 5\' 6"  (1.676 m), weight 54.4 kg, SpO2 100 %. Physical Exam Vitals reviewed.  HENT:     Head: Normocephalic.  Eyes:     Extraocular Movements: Extraocular movements intact.  Cardiovascular:     Rate and Rhythm: Normal rate and regular rhythm.  Pulmonary:     Effort: Pulmonary effort is normal.     Breath sounds: Normal breath sounds.  Abdominal:     General: There is distension.     Palpations: Abdomen is soft.     Tenderness: There is abdominal tenderness.     Hernia: No hernia is present.  Musculoskeletal:     Comments: Moves all extremities   Skin:    General: Skin is warm.   Neurological:     General: No focal deficit present.     Mental Status: She is alert and oriented to person, place, and time.  Psychiatric:        Mood and Affect: Mood normal.        Behavior: Behavior normal.     Results: Results for orders placed or performed during the hospital encounter of 09/23/22 (from the past 48 hour(s))  CBC with Differential     Status: Abnormal   Collection Time: 09/23/22  7:47 AM  Result Value Ref Range   WBC 6.2 4.0 - 10.5 K/uL   RBC 4.04 3.87 - 5.11 MIL/uL   Hemoglobin 12.1 12.0 - 15.0 g/dL   HCT 16.1 09.6 - 04.5 %   MCV 92.8 80.0 - 100.0 fL   MCH 30.0 26.0 - 34.0 pg   MCHC 32.3 30.0 - 36.0 g/dL   RDW 40.9 81.1 - 91.4 %   Platelets 423 (H) 150 - 400 K/uL   nRBC 0.0 0.0 - 0.2 %   Neutrophils Relative % 80 %   Neutro Abs 4.9 1.7 - 7.7 K/uL   Lymphocytes Relative 15 %   Lymphs Abs 0.9 0.7 - 4.0 K/uL   Monocytes Relative 5 %   Monocytes Absolute 0.3 0.1 - 1.0 K/uL   Eosinophils Relative 0 %   Eosinophils Absolute 0.0 0.0 - 0.5 K/uL   Basophils Relative 0 %   Basophils Absolute 0.0 0.0 - 0.1 K/uL   Immature Granulocytes 0 %   Abs Immature Granulocytes 0.02 0.00 - 0.07 K/uL    Comment: Performed at [facility], [address], [place], [place] [address]  Comprehensive metabolic panel     Status: Abnormal   Collection Time: 09/23/22  7:47 AM  Result Value Ref Range   Sodium 137 135 - 145 mmol/L   Potassium 3.4 (L) 3.5 - 5.1 mmol/L   Chloride 105 98 - 111 mmol/L   CO2 24 22 - 32 mmol/L   Glucose, Bld 114 (H) 70 - 99 mg/dL    Comment: Glucose reference range applies only to samples taken after fasting for at least 8 hours.   BUN 10 8 - 23 mg/dL   Creatinine, Ser 6.21 0.44 - 1.00 mg/dL   Calcium 8.9 8.9 - 30.8 mg/dL   Total Protein 7.0 6.5 - 8.1 g/dL   Albumin 3.0 (L) 3.5 - 5.0 g/dL   AST 19 15 - 41 U/L   ALT 10 0 - 44 U/L   Alkaline Phosphatase 54 38 - 126 U/L   Total Bilirubin 1.0 0.3 - 1.2 mg/dL  GFR, Estimated >60 >60 mL/min     Comment: (NOTE) Calculated using the CKD-EPI Creatinine Equation (2021)    Anion gap 8 5 - 15    Comment: Performed at [facility], [address] [place], [place] [address]  Protime-INR     Status: Abnormal   Collection Time: 09/23/22  7:47 AM  Result Value Ref Range   Prothrombin Time 42.8 (H) 11.4 - 15.2 seconds   INR 4.6 (HH) 0.8 - 1.2    Comment: CRITICAL RESULT CALLED TO, READ BACK BY AND VERIFIED WITH: Fredrich Birks AT [id number] ON [id number] BY S DALTON REPEATED TO VERIFY (NOTE) INR goal varies based on device and disease states. Performed at [facility], [address] [place], [place] [address]   Lipase, blood     Status: None   Collection Time: 09/23/22  7:47 AM  Result Value Ref Range   Lipase 39 11 - 51 U/L    Comment: Performed at [facility], [address] [place], [place] [address]   Personally reviewed- fecalized bowel in the pelvis and transition to  decompressed bowel  CT ABDOMEN PELVIS W CONTRAST  Result Date: 09/23/2022 CLINICAL DATA:  Mid abdominal pain, nausea, and vomiting. EXAM: CT ABDOMEN AND PELVIS WITH CONTRAST TECHNIQUE: Multidetector CT imaging of the abdomen and pelvis was performed using the standard protocol following bolus administration of intravenous contrast. RADIATION DOSE REDUCTION: This exam was performed according to the departmental dose-optimization program which includes automated exposure control, adjustment of the mA and/or kV according to patient size and/or use of iterative reconstruction technique. CONTRAST:  75mL OMNIPAQUE IOHEXOL 300 MG/ML  SOLN COMPARISON:  CT abdomen and pelvis 11/29/2021 FINDINGS: Lower chest: No acute abnormality. Hepatobiliary: No focal liver abnormality. Two small stones in the gallbladder. No biliary dilatation. Pancreas: Unremarkable. Spleen: Unremarkable. Adrenals/Urinary Tract: Unremarkable right adrenal gland. Unchanged left adrenal calcification for which no follow-up imaging is recommend. No renal calculi or  hydronephrosis. No suspicious renal mass. Unremarkable bladder. Stomach/Bowel: The stomach is mildly distended by fluid. There are numerous loops of fluid-filled, mildly dilated small bowel measuring up to 3 cm in diameter. There is a transition to decompressed distal small bowel in the pelvis with some of these pelvic small bowel loops appearing mildly thick-walled. The colon is nondilated. Vascular/Lymphatic: Abdominal aortic atherosclerosis without aneurysm. No enlarged lymph nodes. Reproductive: Status post hysterectomy.  No pelvic mass. Other: Small volume abdominal and pelvic ascites. No organized fluid collection. No pneumoperitoneum. Musculoskeletal: No suspicious osseous lesion. Asymmetrically advanced disc degeneration on the left at L4-5 and on the right at L5-S1 with grade 1 anterolisthesis at the former. IMPRESSION: 1. Fluid-filled dilated small bowel loops consistent with obstruction with transition point in the pelvis and mild inflammation of some pelvic small bowel loops. 2. Small volume ascites. 3. Cholelithiasis. 4.  Aortic Atherosclerosis (ICD10-I70.0). Electronically Signed   By: Sebastian Ache M.D.   On: 09/23/2022 10:04     Assessment & Plan:  Cheryl Swanson is a 89 y.o. female with SBO in the pelvis. She has had a prior hysterectomy and SBO.   NG, NPO Supra-therapeutic on coumadin, hold  Will manage conservatively for now, hold on any SBFT due to INR 4.6  Discussed trying to manage this nonoperatively and would only do surgery if absolutely necessary due to risk of more scarring   All questions were answered to the satisfaction of the patient and family.    Lucretia Roers 09/23/2022, 10:55 AM

## 2022-09-23 NOTE — Hospital Course (Signed)
89 year old female with a history of recurrent DVT/PE, glaucoma, hyperlipidemia, rheumatoid arthritis, COPD presenting with 1 day history of abdominal pain with nausea and vomiting.  The patient states that she has been in her usual state of health before she began developing the above symptoms in the late evening on 09/22/2022.  She had innumerable episodes of vomiting and continued to vomit throughout the night and into this morning.  Because of continued abdominal pain, she presented for further evaluation and treatment.  The patient states that she did have a small bowel movement on the evening of 09/22/2022 with a little bit of relief, but her symptoms subsequently returned. Patient denies fevers, chills, headache, chest pain, dyspnea, nausea, vomiting, diarrhea, dysuria, hematuria, hematochezia, and melena.  The patient denies any recent surgeries.  She denies any new medications in the past month.  She denies any over-the-counter medicines that are new. In the ED, the patient was afebrile and hemodynamically stable with oxygen saturation 100% room air.  BMP showed a sodium 137, potassium 3.4, bicarbonate 24, serum creatinine 0.56.  LFTs were unremarkable.  WBC 6.2, hemoglobin 12.1, platelets 423,000.  CT of the abdomen and pelvis showed fluid-filled dilated small bowel loops consistent with obstruction with transition point in the pelvis.  There was mild inflammation of the pelvic small bowel loops.  General surgery was consulted.  The patient was started on IV fluids.

## 2022-09-23 NOTE — ED Triage Notes (Signed)
Pt c/o mid abdominal pain that started lastnight, also c/o N/V that started lastnight as well

## 2022-09-24 ENCOUNTER — Inpatient Hospital Stay (HOSPITAL_COMMUNITY): Payer: Medicare Other

## 2022-09-24 DIAGNOSIS — R791 Abnormal coagulation profile: Secondary | ICD-10-CM | POA: Diagnosis not present

## 2022-09-24 DIAGNOSIS — K56609 Unspecified intestinal obstruction, unspecified as to partial versus complete obstruction: Secondary | ICD-10-CM | POA: Diagnosis not present

## 2022-09-24 LAB — BASIC METABOLIC PANEL
Anion gap: 8 (ref 5–15)
BUN: 9 mg/dL (ref 8–23)
CO2: 22 mmol/L (ref 22–32)
Calcium: 8.3 mg/dL — ABNORMAL LOW (ref 8.9–10.3)
Chloride: 106 mmol/L (ref 98–111)
Creatinine, Ser: 0.59 mg/dL (ref 0.44–1.00)
GFR, Estimated: >60 mL/min (ref >60)
Glucose, Bld: 75 mg/dL (ref 70–99)
Potassium: 3.5 mmol/L (ref 3.5–5.1)
Sodium: 136 mmol/L (ref 135–145)

## 2022-09-24 LAB — CBC
HCT: 37.8 % (ref 36.0–46.0)
Hemoglobin: 12.1 g/dL (ref 12.0–15.0)
MCH: 30.2 pg (ref 26.0–34.0)
MCHC: 32 g/dL (ref 30.0–36.0)
MCV: 94.3 fL (ref 80.0–100.0)
Platelets: 445 10*3/uL — ABNORMAL HIGH (ref 150–400)
RBC: 4.01 MIL/uL (ref 3.87–5.11)
RDW: 14.5 % (ref 11.5–15.5)
WBC: 6.5 10*3/uL (ref 4.0–10.5)
nRBC: 0 % (ref 0.0–0.2)

## 2022-09-24 LAB — PROTIME-INR
INR: 4.5 (ref 0.8–1.2)
Prothrombin Time: 42.5 seconds — ABNORMAL HIGH (ref 11.4–15.2)

## 2022-09-24 LAB — MAGNESIUM: Magnesium: 2.1 mg/dL (ref 1.7–2.4)

## 2022-09-24 LAB — PHOSPHORUS: Phosphorus: 2 mg/dL — ABNORMAL LOW (ref 2.5–4.6)

## 2022-09-24 MED ORDER — SODIUM PHOSPHATES 45 MMOLE/15ML IV SOLN
30.0000 mmol | Freq: Once | INTRAVENOUS | Status: AC
  Administered 2022-09-24: 30 mmol via INTRAVENOUS
  Filled 2022-09-24: qty 10

## 2022-09-24 MED ORDER — POTASSIUM CHLORIDE 10 MEQ/100ML IV SOLN
10.0000 meq | INTRAVENOUS | Status: AC
  Administered 2022-09-24 (×2): 10 meq via INTRAVENOUS
  Filled 2022-09-24 (×2): qty 100

## 2022-09-24 NOTE — Progress Notes (Signed)
PROGRESS NOTE  Cheryl Swanson:[id number] DOB: Oct 10, 1931 DOA: 09/23/2022 PCP: [name], MD  Brief History:  89 year old female with a history of recurrent DVT/PE, glaucoma, hyperlipidemia, rheumatoid arthritis, COPD presenting with 1 day history of abdominal pain with nausea and vomiting.  The patient states that she has been in her usual state of health before she began developing the above symptoms in the late evening on 09/22/2022.  She had innumerable episodes of vomiting and continued to vomit throughout the night and into this morning.  Because of continued abdominal pain, she presented for further evaluation and treatment.  The patient states that she did have a small bowel movement on the evening of 09/22/2022 with a little bit of relief, but her symptoms subsequently returned. Patient denies fevers, chills, headache, chest pain, dyspnea, nausea, vomiting, diarrhea, dysuria, hematuria, hematochezia, and melena.  The patient denies any recent surgeries.  She denies any new medications in the past month.  She denies any over-the-counter medicines that are new. In the ED, the patient was afebrile and hemodynamically stable with oxygen saturation 100% room air.  BMP showed a sodium 137, potassium 3.4, bicarbonate 24, serum creatinine 0.56.  LFTs were unremarkable.  WBC 6.2, hemoglobin 12.1, platelets 423,000.  CT of the abdomen and pelvis showed fluid-filled dilated small bowel loops consistent with obstruction with transition point in the pelvis.  There was mild inflammation of the pelvic small bowel loops.  General surgery was consulted.  The patient was started on IV fluids.    Assessment/Plan: Small bowel obstruction -09/23/2022 CT abdomen as discussed above -General surgery consulted - NG decompression -Start IV fluids -5/7--had small BM>>NG clamped -start ice chips -possibly remove NG 5/8   Supratherapeutic INR -Presented with INR 4.6 -No recent new  medications -Likely due to poor oral intake -As the patient is clinically stable without any signs of active bleed,, plan to allow INR to drift down unless the patient needs operative intervention sooner -Hold warfarin   Recurrent DVT/PE -Holding warfarin as discussed above -Plan IV heparin when INR is subtherapeutic   Rheumatoid arthritis -Patient states that her PCP stopped methotrexate about 5 months prior to this admission   Hyperlipidemia -Restart statin once able to tolerate p.o.         Family Communication:   daughters at bedside 5/7  Consultants:  general surgery  Code Status:  FULL   DVT Prophylaxis:  coumadin   Procedures: As Listed in Progress Note Above  Antibiotics: None       Subjective: Pt states abd pain is improving.  Had a small BM today.  Passing flatus.  Denies f/c, cp, n/v/d  Objective: Vitals:   09/23/22 1951 09/23/22 2345 09/24/22 0423 09/24/22 1300  BP: 129/76 123/73 137/82 125/71  Pulse: 88  99 88  Resp: 20 18 18 17   Temp: 98.6 F (37 C) 98.5 F (36.9 C) 98.2 F (36.8 C) 98.5 F (36.9 C)  TempSrc: Oral   Oral  SpO2: 98% 99% 98% 99%  Weight:      Height:        Intake/Output Summary (Last 24 hours) at 09/24/2022 1755 Last data filed at 09/24/2022 1500 Gross per 24 hour  Intake 1927.24 ml  Output 50 ml  Net 1877.24 ml   Weight change:  Exam:  General:  Pt is alert, follows commands appropriately, not in acute distress HEENT: No icterus, No thrush, No neck mass, [place]/AT Cardiovascular: RRR, S1/S2, no  rubs, no gallops Respiratory: CTA bilaterally, no wheezing, no crackles, no rhonchi Abdomen: Soft/+BS, mild periumbilical tender, non distended, no guarding Extremities: No edema, No lymphangitis, No petechiae, No rashes, no synovitis   Data Reviewed: I have personally reviewed following labs and imaging studies Basic Metabolic Panel: Recent Labs  Lab 09/19/22 1337 09/23/22 0747 09/24/22 0402 09/24/22 0429  NA 133*  137 136  --   K 3.6 3.4* 3.5  --   CL 101 105 106  --   CO2 25 24 22   --   GLUCOSE 104* 114* 75  --   BUN 9 10 9   --   CREATININE 0.60 0.56 0.59  --   CALCIUM 9.0 8.9 8.3*  --   MG  --   --   --  2.1  PHOS  --   --   --  2.0*   Liver Function Tests: Recent Labs  Lab 09/19/22 1337 09/23/22 0747  AST 21 19  ALT 10 10  ALKPHOS 59 54  BILITOT 1.0 1.0  PROT 7.0 7.0  ALBUMIN 3.0* 3.0*   Recent Labs  Lab 09/23/22 0747  LIPASE 39   No results for input(s): "AMMONIA" in the last 168 hours. Coagulation Profile: Recent Labs  Lab 09/19/22 1337 09/23/22 0747 09/24/22 0402  INR 3.6* 4.6* 4.5*   CBC: Recent Labs  Lab 09/19/22 1337 09/23/22 0747 09/24/22 0402  WBC 5.1 6.2 6.5  NEUTROABS 2.8 4.9  --   HGB 12.1 12.1 12.1  HCT 37.5 37.5 37.8  MCV 92.8 92.8 94.3  PLT 396 423* 445*   Cardiac Enzymes: No results for input(s): "CKTOTAL", "CKMB", "CKMBINDEX", "TROPONINI" in the last 168 hours. BNP: Invalid input(s): "POCBNP" CBG: No results for input(s): "GLUCAP" in the last 168 hours. HbA1C: No results for input(s): "HGBA1C" in the last 72 hours. Urine analysis:    Component Value Date/Time   COLORURINE RED (A) 11/29/2021 2206   APPEARANCEUR CLOUDY (A) 11/29/2021 2206   LABSPEC 1.011 11/29/2021 2206   PHURINE 7.0 11/29/2021 2206   GLUCOSEU NEGATIVE 11/29/2021 2206   HGBUR LARGE (A) 11/29/2021 2206   BILIRUBINUR NEGATIVE 11/29/2021 2206   KETONESUR NEGATIVE 11/29/2021 2206   PROTEINUR 100 (A) 11/29/2021 2206   NITRITE NEGATIVE 11/29/2021 2206   LEUKOCYTESUR NEGATIVE 11/29/2021 2206   Sepsis Labs: @LABRCNTIP (procalcitonin:4,lacticidven:4) )No results found for this or any previous visit (from the past 240 hour(s)).   Scheduled Meds: Continuous Infusions:  0.9 % NaCl with KCl 20 mEq / L 75 mL/hr at 09/24/22 1602   sodium phosphate 30 mmol in dextrose 5 % 250 mL infusion 30 mmol (09/24/22 1557)    Procedures/Studies: DG Abd 1 View  Result Date:  09/24/2022 CLINICAL DATA:  [id number] SBO (small bowel obstruction) (HCC) [id number] EXAM: ABDOMEN - 1 VIEW COMPARISON:  09/23/2022 CT FINDINGS: NG tube tip in the proximal stomach. No significant bowel dilatation by plain radiography. Air and stool noted throughout the nondistended colon. Degenerative changes of the lumbosacral spine and SI joints. No acute osseous finding. No abnormal calcifications. Aortoiliac atherosclerosis present. IMPRESSION: 1. NG tube tip in the proximal stomach. 2. Nonobstructive bowel gas pattern by plain radiography. Electronically Signed   By: Judie Petit.  Shick M.D.   On: 09/24/2022 13:37   DG Chest Portable 1 View  Result Date: 09/23/2022 CLINICAL DATA:  NGT placement EXAM: PORTABLE CHEST 1 VIEW COMPARISON:  CXR 07/30/22 FINDINGS: No pleural effusion. No pneumothorax. Normal cardiac and mediastinal contours. No focal airspace opacity. Enteric tube with the tip in  the proximal stomach and the side hole above the level of the GE junction. Recommend advancement. Contrast opacified renal collecting system. IMPRESSION: Enteric tube with the tip in the proximal stomach and the side hole above the level of the GE junction. Recommend advancement. Electronically Signed   By: Lorenza Cambridge M.D.   On: 09/23/2022 12:33   CT ABDOMEN PELVIS W CONTRAST  Result Date: 09/23/2022 CLINICAL DATA:  Mid abdominal pain, nausea, and vomiting. EXAM: CT ABDOMEN AND PELVIS WITH CONTRAST TECHNIQUE: Multidetector CT imaging of the abdomen and pelvis was performed using the standard protocol following bolus administration of intravenous contrast. RADIATION DOSE REDUCTION: This exam was performed according to the departmental dose-optimization program which includes automated exposure control, adjustment of the mA and/or kV according to patient size and/or use of iterative reconstruction technique. CONTRAST:  75mL OMNIPAQUE IOHEXOL 300 MG/ML  SOLN COMPARISON:  CT abdomen and pelvis 11/29/2021 FINDINGS: Lower chest: No acute  abnormality. Hepatobiliary: No focal liver abnormality. Two small stones in the gallbladder. No biliary dilatation. Pancreas: Unremarkable. Spleen: Unremarkable. Adrenals/Urinary Tract: Unremarkable right adrenal gland. Unchanged left adrenal calcification for which no follow-up imaging is recommend. No renal calculi or hydronephrosis. No suspicious renal mass. Unremarkable bladder. Stomach/Bowel: The stomach is mildly distended by fluid. There are numerous loops of fluid-filled, mildly dilated small bowel measuring up to 3 cm in diameter. There is a transition to decompressed distal small bowel in the pelvis with some of these pelvic small bowel loops appearing mildly thick-walled. The colon is nondilated. Vascular/Lymphatic: Abdominal aortic atherosclerosis without aneurysm. No enlarged lymph nodes. Reproductive: Status post hysterectomy.  No pelvic mass. Other: Small volume abdominal and pelvic ascites. No organized fluid collection. No pneumoperitoneum. Musculoskeletal: No suspicious osseous lesion. Asymmetrically advanced disc degeneration on the left at L4-5 and on the right at L5-S1 with grade 1 anterolisthesis at the former. IMPRESSION: 1. Fluid-filled dilated small bowel loops consistent with obstruction with transition point in the pelvis and mild inflammation of some pelvic small bowel loops. 2. Small volume ascites. 3. Cholelithiasis. 4.  Aortic Atherosclerosis (ICD10-I70.0). Electronically Signed   By: Sebastian Ache M.D.   On: 09/23/2022 10:04   ECHOCARDIOGRAM COMPLETE  Result Date: 09/20/2022    ECHOCARDIOGRAM REPORT   Patient Name:   SVETLANA ILLA Date of Exam: 09/20/2022 Medical Rec #:  [id number]      Height:       66.0 in Accession #:    [id number]     Weight:       120.0 lb Date of Birth:  1932-02-12       BSA:          1.610 m Patient Age:    89 years       BP:           129/68 mmHg Patient Gender: F              HR:           68 bpm. Exam Location:  Outpatient Procedure: 2D Echo, 3D Echo, Cardiac  Doppler, Color Doppler and Strain Analysis Indications:     R60.0 Lower extremity edema  History:         Patient has prior history of Echocardiogram examinations, most                  recent 03/13/2020. Risk Factors:Dyslipidemia. Pulmonary                  embolus.  Sonographer:  Sheralyn Boatman RDCS Referring Phys:  [id number] [name] Diagnosing Phys: Weston Brass MD  Sonographer Comments: Technically difficult study due to poor echo windows, suboptimal parasternal window, suboptimal apical window and suboptimal subcostal window. Extremely difficult windows. Patient with limited mobility of extremities due to RA. IMPRESSIONS  1. Left ventricular ejection fraction, by estimation, is 55%. The left ventricle has normal function. The left ventricle has no regional wall motion abnormalities. Left ventricular diastolic parameters are indeterminate. There is abnormal septal motion.  2. Right ventricular systolic function is normal. The right ventricular size is normal. There is mildly elevated pulmonary artery systolic pressure. The estimated right ventricular systolic pressure is 37.3 mmHg.  3. Mild postinflammatory appearance to mitral valve opening. Submitral chordal calcifications. The mitral valve is abnormal. Mild mitral valve regurgitation. Mild mitral stenosis. The mean mitral valve gradient is 5.0 mmHg with average heart rate of 71 bpm.  4. The aortic valve is grossly normal. There is mild calcification of the aortic valve. Aortic valve regurgitation is trivial.  5. The inferior vena cava is dilated in size with <50% respiratory variability, suggesting right atrial pressure of 15 mmHg. FINDINGS  Left Ventricle: Left ventricular ejection fraction, by estimation, is 55%. The left ventricle has normal function. The left ventricle has no regional wall motion abnormalities. Global longitudinal strain performed but not reported based on interpreter judgement due to suboptimal tracking. 3D ejection fraction reviewed  and evaluated as part of the interpretation. Alternate measurement of EF is felt to be most reflective of LV function. The left ventricular internal cavity size was normal in size. There  is no left ventricular hypertrophy. Abnormal septal motion. Left ventricular diastolic parameters are indeterminate. Right Ventricle: The right ventricular size is normal. No increase in right ventricular wall thickness. Right ventricular systolic function is normal. There is mildly elevated pulmonary artery systolic pressure. The tricuspid regurgitant velocity is 2.36  m/s, and with an assumed right atrial pressure of 15 mmHg, the estimated right ventricular systolic pressure is 37.3 mmHg. Left Atrium: Left atrial size was normal in size. Right Atrium: Right atrial size was normal in size. Pericardium: There is no evidence of pericardial effusion. Mitral Valve: Mild postinflammatory appearance to mitral valve opening. Submitral chordal calcifications. The mitral valve is abnormal. Mildly decreased mobility of the mitral valve leaflets. Mild mitral valve regurgitation. Mild mitral valve stenosis. MV peak gradient, 9.5 mmHg. The mean mitral valve gradient is 5.0 mmHg with average heart rate of 71 bpm. Tricuspid Valve: The tricuspid valve is normal in structure. Tricuspid valve regurgitation is mild. Aortic Valve: The aortic valve is grossly normal. There is mild calcification of the aortic valve. Aortic valve regurgitation is trivial. Aortic valve mean gradient measures 5.0 mmHg. Aortic valve peak gradient measures 9.0 mmHg. Aortic valve area, by VTI measures 2.23 cm. Pulmonic Valve: The pulmonic valve was not well visualized. Pulmonic valve regurgitation is not visualized. Aorta: The aortic root is normal in size and structure. Venous: The inferior vena cava is dilated in size with less than 50% respiratory variability, suggesting right atrial pressure of 15 mmHg. IAS/Shunts: No atrial level shunt detected by color flow Doppler.   LEFT VENTRICLE PLAX 2D LVIDd:         3.60 cm     Diastology LVIDs:         2.50 cm     LV e' medial:    6.15 cm/s LV PW:         0.80 cm     LV  E/e' medial:  20.3 LV IVS:        0.80 cm     LV e' lateral:   7.72 cm/s LVOT diam:     2.20 cm     LV E/e' lateral: 16.2 LV SV:         78 LV SV Index:   48 LVOT Area:     3.80 cm                             3D Volume EF: LV Volumes (MOD)           3D EF:        58 % LV vol d, MOD A2C: 50.0 ml LV EDV:       103 ml LV vol d, MOD A4C: 64.4 ml LV ESV:       43 ml LV vol s, MOD A2C: 18.0 ml LV SV:        60 ml LV vol s, MOD A4C: 27.2 ml LV SV MOD A2C:     32.0 ml LV SV MOD A4C:     64.4 ml LV SV MOD BP:      34.9 ml RIGHT VENTRICLE             IVC RV S prime:     11.60 cm/s  IVC diam: 2.70 cm TAPSE (M-mode): 2.2 cm LEFT ATRIUM             Index        RIGHT ATRIUM          Index LA diam:        3.20 cm 1.99 cm/m   RA Area:     6.00 cm LA Vol (A2C):   7.2 ml  4.49 ml/m   RA Volume:   9.33 ml  5.80 ml/m LA Vol (A4C):   17.3 ml 10.75 ml/m LA Biplane Vol: 11.8 ml 7.33 ml/m  AORTIC VALVE AV Area (Vmax):    2.50 cm AV Area (Vmean):   2.52 cm AV Area (VTI):     2.23 cm AV Vmax:           150.00 cm/s AV Vmean:          100.000 cm/s AV VTI:            0.347 m AV Peak Grad:      9.0 mmHg AV Mean Grad:      5.0 mmHg LVOT Vmax:         98.50 cm/s LVOT Vmean:        66.200 cm/s LVOT VTI:          0.204 m LVOT/AV VTI ratio: 0.59  AORTA Ao Root diam: 2.90 cm Ao Asc diam:  2.60 cm MITRAL VALVE                TRICUSPID VALVE MV Area (PHT): 2.50 cm     TR Peak grad:   22.3 mmHg MV Area VTI:   1.54 cm     TR Vmax:        236.00 cm/s MV Peak grad:  9.5 mmHg MV Mean grad:  5.0 mmHg     SHUNTS MV Vmax:       1.54 m/s     Systemic VTI:  0.20 m MV Vmean:      101.0 cm/s   Systemic Diam: 2.20 cm MV Decel Time: 303 msec MV E velocity: 125.00 cm/s MV  A velocity: 132.00 cm/s MV E/A ratio:  0.95 Weston Brass MD Electronically signed by Weston Brass MD Signature Date/Time:  09/20/2022/11:25:37 AM    Final (Updated)     Catarina Hartshorn, DO  [facility]  If 7PM-7AM, please contact night-coverage [email or web address] Password [email or web address] 09/24/2022, 5:55 PM   LOS: 1 day

## 2022-09-24 NOTE — Progress Notes (Signed)
Mobility Specialist Progress Note:   09/24/22 1139  Mobility  Activity Transferred from bed to chair  Level of Assistance Minimal assist, patient does 75% or more  Assistive Device Front wheel walker  Distance Ambulated (ft) 3 ft  Activity Response Tolerated well  Mobility Referral Yes  $Mobility charge 1 Mobility  Mobility Specialist Start Time (ACUTE ONLY) 1123  Mobility Specialist Stop Time (ACUTE ONLY) 1132  Mobility Specialist Time Calculation (min) (ACUTE ONLY) 9 min   Pt agreeable to mobility session, received in bed, family at bedside. Tolerated transfer well, asx throughout. Left pt in chair, alarm on, all needs met.   Feliciana Rossetti Mobility Specialist Please contact via Special educational needs teacher or  Rehab office at [phone]

## 2022-09-24 NOTE — Progress Notes (Signed)
Upon entering room to administer pt scheduled medications, NG tube noted to be hanging almost completely out of pt R nostril. NG tube pulled the remainder of the way out by charge nurse whom was also present in the room. Pt tolerated well. Ambulated to bathroom x1 assist with walker and now currently in bed. Low position and bed alarm on, call bell in reach.

## 2022-09-24 NOTE — TOC Progression Note (Signed)
  Transition of Care [facility]) Screening Note   Patient Details  Name: Cheryl Swanson Date of Birth: April 09, 1932   Transition of Care [facility]) CM/SW Contact:    Leitha Bleak, RN Phone Number: 09/24/2022, 1:34 PM  From home, independent, TOC following  Transition of Care Department [facility]) has reviewed patient and no TOC needs have been identified at this time. We will continue to monitor patient advancement through interdisciplinary progression rounds. If new patient transition needs arise, please place a TOC consult.      Barriers to Discharge: Continued Medical Work up  Expected Discharge Plan and Services         Social Determinants of Health (SDOH) Interventions SDOH Screenings   Food Insecurity: No Food Insecurity (09/23/2022)  Housing: Low Risk  (09/23/2022)  Transportation Needs: No Transportation Needs (09/23/2022)  Utilities: Not At Risk (09/23/2022)  Tobacco Use: Low Risk  (09/23/2022)    Readmission Risk Interventions     No data to display

## 2022-09-24 NOTE — Progress Notes (Addendum)
Subjective:  Cheryl Swanson is a 89 y/o with PMHx of rheumatoid arthritis, hyperlipidemia and recurrent DVT/PE who presented with a 1 day history of N/V and abdominal pain on 09/23/2022.   Overnight she has not had any abdominal pain, fever or chills. She had one episode of vomiting yesterday evening. She has not had a bowel movement since yesterday morning, but has been passing flatus intermittently throughout the night. She denies chest pain or SOB and overall states that she is feeling fine.   Objective: Vital signs in last 24 hours: Temp:  [98.2 F (36.8 C)-99.7 F (37.6 C)] 98.2 F (36.8 C) (05/07 0423) Pulse Rate:  [80-99] 99 (05/07 0423) Resp:  [18-24] 18 (05/07 0423) BP: (120-137)/(68-82) 137/82 (05/07 0423) SpO2:  [97 %-100 %] 98 % (05/07 0423) Last BM Date : 09/23/22 (per patient)  Intake/Output from previous day: 05/06 0701 - 05/07 0700 In: 941 [I.V.:941] Out: 50 [Emesis/NG output:50] Intake/Output this shift: No intake/output data recorded.  Physical Exam HENT:     Head: Normocephalic and atraumatic.  Cardiovascular:     Rate and Rhythm: Normal rate and regular rhythm.     Heart sounds: Normal heart sounds.  Pulmonary:     Effort: Pulmonary effort is normal.     Breath sounds: Normal breath sounds.  Abdominal:     General: Bowel sounds are increased. There is no distension.     Palpations: Abdomen is soft.     Tenderness: There is no abdominal tenderness.  Skin:    General: Skin is warm.  Neurological:     Mental Status: She is alert and oriented to person, place, and time.  Psychiatric:        Mood and Affect: Mood normal.      Lab Results:  Recent Labs    09/23/22 0747 09/24/22 0402  WBC 6.2 6.5  HGB 12.1 12.1  HCT 37.5 37.8  PLT 423* 445*   BMET Recent Labs    09/23/22 0747 09/24/22 0402  NA 137 136  K 3.4* 3.5  CL 105 106  CO2 24 22  GLUCOSE 114* 75  BUN 10 9  CREATININE 0.56 0.59  CALCIUM 8.9 8.3*   PT/INR Recent Labs     09/23/22 0747 09/24/22 0402  LABPROT 42.8* 42.5*  INR 4.6* 4.5*    Studies/Results: DG Chest Portable 1 View  Result Date: 09/23/2022 CLINICAL DATA:  NGT placement EXAM: PORTABLE CHEST 1 VIEW COMPARISON:  CXR 07/30/22 FINDINGS: No pleural effusion. No pneumothorax. Normal cardiac and mediastinal contours. No focal airspace opacity. Enteric tube with the tip in the proximal stomach and the side hole above the level of the GE junction. Recommend advancement. Contrast opacified renal collecting system. IMPRESSION: Enteric tube with the tip in the proximal stomach and the side hole above the level of the GE junction. Recommend advancement. Electronically Signed   By: Lorenza Cambridge M.D.   On: 09/23/2022 12:33   CT ABDOMEN PELVIS W CONTRAST  Result Date: 09/23/2022 CLINICAL DATA:  Mid abdominal pain, nausea, and vomiting. EXAM: CT ABDOMEN AND PELVIS WITH CONTRAST TECHNIQUE: Multidetector CT imaging of the abdomen and pelvis was performed using the standard protocol following bolus administration of intravenous contrast. RADIATION DOSE REDUCTION: This exam was performed according to the departmental dose-optimization program which includes automated exposure control, adjustment of the mA and/or kV according to patient size and/or use of iterative reconstruction technique. CONTRAST:  75mL OMNIPAQUE IOHEXOL 300 MG/ML  SOLN COMPARISON:  CT abdomen and pelvis 11/29/2021 FINDINGS:  Lower chest: No acute abnormality. Hepatobiliary: No focal liver abnormality. Two small stones in the gallbladder. No biliary dilatation. Pancreas: Unremarkable. Spleen: Unremarkable. Adrenals/Urinary Tract: Unremarkable right adrenal gland. Unchanged left adrenal calcification for which no follow-up imaging is recommend. No renal calculi or hydronephrosis. No suspicious renal mass. Unremarkable bladder. Stomach/Bowel: The stomach is mildly distended by fluid. There are numerous loops of fluid-filled, mildly dilated small bowel measuring up  to 3 cm in diameter. There is a transition to decompressed distal small bowel in the pelvis with some of these pelvic small bowel loops appearing mildly thick-walled. The colon is nondilated. Vascular/Lymphatic: Abdominal aortic atherosclerosis without aneurysm. No enlarged lymph nodes. Reproductive: Status post hysterectomy.  No pelvic mass. Other: Small volume abdominal and pelvic ascites. No organized fluid collection. No pneumoperitoneum. Musculoskeletal: No suspicious osseous lesion. Asymmetrically advanced disc degeneration on the left at L4-5 and on the right at L5-S1 with grade 1 anterolisthesis at the former. IMPRESSION: 1. Fluid-filled dilated small bowel loops consistent with obstruction with transition point in the pelvis and mild inflammation of some pelvic small bowel loops. 2. Small volume ascites. 3. Cholelithiasis. 4.  Aortic Atherosclerosis (ICD10-I70.0). Electronically Signed   By: Sebastian Ache M.D.   On: 09/23/2022 10:04    Anti-infectives: Anti-infectives (From admission, onward)    None       Assessment/Plan:  Cheryl Swanson is a 89 y/o with PMHx of rheumatoid arthritis, hyperlipidemia and recurrent DVT/PE who presented with a 1 day history of N/V and abdominal pain on 09/23/2022 and was admitted for SBO.   #SBO Patient's symptoms are reassuring that her SBO is resolving. Per hospitalist note, she had multiple episodes of N/V on 5/5, however she has only had one episode in the last 24 hours. NG Tube output of 50 mL overnight. She has been passing flatus, but she has not had a bowel movement since yesterday morning. She does not have fever or chills and is HDS - she is not a candidate for emergent surgery intervention. I think she can continue to be managed conservatively with NPO and NG Tube decompression. I would want her electrolytes to be repleted specifically potassium and magnesium to avoid ileus. Target goal for potassium that I would recommend is 4.0 and magnesium of 2.0.     LOS: 1 day    Denai Caba L [place] 09/24/2022

## 2022-09-24 NOTE — Progress Notes (Signed)
Patient slept most of the night. Assisted to the bedside commode x3. NG tube output is 50ml for this shift. No complaints of pain.

## 2022-09-24 NOTE — Progress Notes (Signed)
   09/24/22 1610  Provider Notification  Provider Name/Title Dr Thomes Dinning  Date Provider Notified 09/24/22  Time Provider Notified [phone]  Method of Notification Page  Notification Reason Critical Result  Test performed and critical result INR 4.5  Date Critical Result Received 09/24/22  Time Critical Result Received 0618

## 2022-09-25 DIAGNOSIS — R791 Abnormal coagulation profile: Secondary | ICD-10-CM | POA: Diagnosis not present

## 2022-09-25 DIAGNOSIS — K56609 Unspecified intestinal obstruction, unspecified as to partial versus complete obstruction: Secondary | ICD-10-CM | POA: Diagnosis not present

## 2022-09-25 LAB — BASIC METABOLIC PANEL
Anion gap: 7 (ref 5–15)
BUN: 8 mg/dL (ref 8–23)
CO2: 22 mmol/L (ref 22–32)
Calcium: 8.3 mg/dL — ABNORMAL LOW (ref 8.9–10.3)
Chloride: 106 mmol/L (ref 98–111)
Creatinine, Ser: 0.58 mg/dL (ref 0.44–1.00)
GFR, Estimated: >60 mL/min (ref >60)
Glucose, Bld: 72 mg/dL (ref 70–99)
Potassium: 3.6 mmol/L (ref 3.5–5.1)
Sodium: 135 mmol/L (ref 135–145)

## 2022-09-25 LAB — MAGNESIUM: Magnesium: 2 mg/dL (ref 1.7–2.4)

## 2022-09-25 LAB — PHOSPHORUS: Phosphorus: 2.2 mg/dL — ABNORMAL LOW (ref 2.5–4.6)

## 2022-09-25 LAB — PROTIME-INR
INR: 5.2 (ref 0.8–1.2)
Prothrombin Time: 47.8 seconds — ABNORMAL HIGH (ref 11.4–15.2)

## 2022-09-25 NOTE — Progress Notes (Signed)
Mobility Specialist Progress Note:    09/25/22 1033  Mobility  Activity Ambulated with assistance in hallway;Ambulated with assistance to bathroom;Transferred from bed to chair  Level of Assistance Contact guard assist, steadying assist  Assistive Device Front wheel walker  Distance Ambulated (ft) 200 ft  Activity Response Tolerated well  Mobility Referral Yes  $Mobility charge 1 Mobility  Mobility Specialist Start Time (ACUTE ONLY) 1003  Mobility Specialist Stop Time (ACUTE ONLY) 1023  Mobility Specialist Time Calculation (min) (ACUTE ONLY) 20 min   Pt agreeable to mobility session, received in bed, daughter at bedside. Ambulated in hallway via RW with CGA for safety. Tolerated well, asx throughout. Returned pt to room, assisted to bathroom, then transferred to chair. All needs met, call bell in reach, daughter still in room.   Feliciana Rossetti Mobility Specialist Please contact via Special educational needs teacher or  Rehab office at [phone]

## 2022-09-25 NOTE — ED Provider Notes (Signed)
[facility] MEDICAL SURGICAL UNIT Provider Note   CSN: [id number] Arrival date & time: 09/23/22  9147     History  Chief Complaint  Patient presents with   Abdominal Pain    Cheryl Swanson is a 89 y.o. female.  Patient complains of epigastric abdominal pain.  Patient has a history of COPD and is also on Coumadin for blood clots  The history is provided by the patient and medical records. No language interpreter was used.  Abdominal Pain Pain location:  Epigastric Pain quality: aching   Pain radiates to:  Does not radiate Pain severity:  Mild Onset quality:  Sudden Timing:  Constant Progression:  Worsening Chronicity:  New Context: not alcohol use   Relieved by:  Nothing Associated symptoms: no chest pain, no cough, no diarrhea, no fatigue and no hematuria        Home Medications Prior to Admission medications   Medication Sig Start Date End Date Taking? Authorizing Provider  albuterol (PROVENTIL HFA;VENTOLIN HFA) 108 (90 Base) MCG/ACT inhaler Inhale 2 puffs into the lungs every 4 (four) hours as needed for wheezing or shortness of breath. 07/13/15  Yes Eber Hong, MD  calcium-vitamin D (OSCAL WITH D) 500-200 MG-UNIT per tablet Take 1 tablet by mouth 2 (two) times daily.   Yes [provider]  carboxymethylcellulose (REFRESH PLUS) 0.5 % SOLN Place 1 drop into both eyes 2 (two) times daily as needed (dry eyes).   Yes [provider]  dorzolamide-timolol (COSOPT) 2-0.5 % ophthalmic solution Place 1 drop into the right eye 2 (two) times daily. 05/06/22  Yes [provider]  furosemide (LASIX) 40 MG tablet Take 40 mg by mouth daily.   Yes [provider]  HYDROcodone-acetaminophen (NORCO/VICODIN) 5-325 MG tablet Take 1 tablet by mouth every 4 (four) hours as needed. Patient taking differently: Take 2 tablets by mouth every 6 (six) hours as needed for moderate pain. 11/30/21  Yes Sabas Sous, MD  latanoprost (XALATAN) 0.005 % ophthalmic  solution Place 1 drop into both eyes at bedtime.    Yes [provider]  potassium chloride SA (KLOR-CON M) 20 MEQ tablet Take 20 mEq by mouth daily. 07/31/22  Yes [provider]  simvastatin (ZOCOR) 20 MG tablet Take 20 mg by mouth every evening.  10/05/12  Yes [provider]  warfarin (COUMADIN) 5 MG tablet Take 5 mg by mouth daily at 4 PM. 03/17/20  Yes [provider]  LORazepam (ATIVAN) 0.5 MG tablet Take 0.5-1 mg by mouth at bedtime. Patient not taking: Reported on 09/23/2022 03/07/20   [provider]  warfarin (COUMADIN) 2.5 MG tablet Take 1 tablet (2.5 mg total) by mouth daily at 4 PM. Patient not taking: Reported on 09/23/2022 08/19/19   Cleora Fleet, MD      Allergies    Patient has no known allergies.    Review of Systems   Review of Systems  Constitutional:  Negative for appetite change and fatigue.  HENT:  Negative for congestion, ear discharge and sinus pressure.   Eyes:  Negative for discharge.  Respiratory:  Negative for cough.   Cardiovascular:  Negative for chest pain.  Gastrointestinal:  Positive for abdominal pain. Negative for diarrhea.  Genitourinary:  Negative for frequency and hematuria.  Musculoskeletal:  Negative for back pain.  Skin:  Negative for rash.  Neurological:  Negative for seizures and headaches.  Psychiatric/Behavioral:  Negative for hallucinations.     Physical Exam Updated Vital Signs BP 108/72 (BP  Location: Left Arm)   Pulse 90   Temp 98.8 F (37.1 C) (Oral)   Resp 16   Ht 5\' 6"  (1.676 m)   Wt 54.4 kg   SpO2 99%   BMI 19.36 kg/m  Physical Exam Vitals and nursing note reviewed.  Constitutional:      Appearance: She is well-developed.  HENT:     Head: Normocephalic.     Nose: Nose normal.  Eyes:     General: No scleral icterus.    Conjunctiva/sclera: Conjunctivae normal.  Neck:     Thyroid: No thyromegaly.  Cardiovascular:     Rate and Rhythm: Normal rate and regular rhythm.      Heart sounds: No murmur heard.    No friction rub. No gallop.  Pulmonary:     Breath sounds: No stridor. No wheezing or rales.  Chest:     Chest wall: No tenderness.  Abdominal:     General: There is no distension.     Tenderness: There is abdominal tenderness. There is no rebound.  Musculoskeletal:        General: Normal range of motion.     Cervical back: Neck supple.  Lymphadenopathy:     Cervical: No cervical adenopathy.  Skin:    Findings: No erythema or rash.  Neurological:     Mental Status: She is oriented to person, place, and time.     Motor: No abnormal muscle tone.     Coordination: Coordination normal.  Psychiatric:        Behavior: Behavior normal.     ED Results / Procedures / Treatments   Labs (all labs ordered are listed, but only abnormal results are displayed) Labs Reviewed  CBC WITH DIFFERENTIAL/PLATELET - Abnormal; Notable for the following components:      Result Value   Platelets 423 (*)    All other components within normal limits  COMPREHENSIVE METABOLIC PANEL - Abnormal; Notable for the following components:   Potassium 3.4 (*)    Glucose, Bld 114 (*)    Albumin 3.0 (*)    All other components within normal limits  PROTIME-INR - Abnormal; Notable for the following components:   Prothrombin Time 42.8 (*)    INR 4.6 (*)    All other components within normal limits  BASIC METABOLIC PANEL - Abnormal; Notable for the following components:   Calcium 8.3 (*)    All other components within normal limits  CBC - Abnormal; Notable for the following components:   Platelets 445 (*)    All other components within normal limits  PROTIME-INR - Abnormal; Notable for the following components:   Prothrombin Time 42.5 (*)    INR 4.5 (*)    All other components within normal limits  PHOSPHORUS - Abnormal; Notable for the following components:   Phosphorus 2.0 (*)    All other components within normal limits  PROTIME-INR - Abnormal; Notable for the following  components:   Prothrombin Time 47.8 (*)    INR 5.2 (*)    All other components within normal limits  PHOSPHORUS - Abnormal; Notable for the following components:   Phosphorus 2.2 (*)    All other components within normal limits  BASIC METABOLIC PANEL - Abnormal; Notable for the following components:   Calcium 8.3 (*)    All other components within normal limits  LIPASE, BLOOD  MAGNESIUM  MAGNESIUM    EKG None  Radiology DG Abd 1 View  Result Date: 09/24/2022 CLINICAL DATA:  [id number] SBO (small bowel  obstruction) (HCC) [id number] EXAM: ABDOMEN - 1 VIEW COMPARISON:  09/23/2022 CT FINDINGS: NG tube tip in the proximal stomach. No significant bowel dilatation by plain radiography. Air and stool noted throughout the nondistended colon. Degenerative changes of the lumbosacral spine and SI joints. No acute osseous finding. No abnormal calcifications. Aortoiliac atherosclerosis present. IMPRESSION: 1. NG tube tip in the proximal stomach. 2. Nonobstructive bowel gas pattern by plain radiography. Electronically Signed   By: Judie Petit.  Shick M.D.   On: 09/24/2022 13:37    Procedures Procedures    Medications Ordered in ED Medications  0.9 % NaCl with KCl 20 mEq/ L  infusion ( Intravenous Rate/Dose Change 09/25/22 0843)  acetaminophen (TYLENOL) tablet 650 mg (has no administration in time range)    Or  acetaminophen (TYLENOL) suppository 650 mg (has no administration in time range)  ondansetron (ZOFRAN) tablet 4 mg ( Per Tube See Alternative 09/23/22 1517)    Or  ondansetron (ZOFRAN) injection 4 mg (4 mg Intravenous Given 09/23/22 1517)  pantoprazole (PROTONIX) injection 40 mg (40 mg Intravenous Given 09/23/22 0738)  sodium chloride 0.9 % bolus 500 mL (500 mLs Intravenous Bolus 09/23/22 0737)  iohexol (OMNIPAQUE) 300 MG/ML solution 75 mL (75 mLs Intravenous Contrast Given 09/23/22 0910)  ondansetron (ZOFRAN) injection 4 mg (4 mg Intravenous Given 09/23/22 1041)  sodium phosphate 30 mmol in dextrose 5 % 250 mL  infusion (30 mmol Intravenous New Bag/Given 09/24/22 1557)  potassium chloride 10 mEq in 100 mL IVPB (0 mEq Intravenous Stopped 09/24/22 1557)    ED Course/ Medical Decision Making/ A&P                             Medical Decision Making Amount and/or Complexity of Data Reviewed Labs: ordered. Radiology: ordered. ECG/medicine tests: ordered.  Risk Prescription drug management. Decision regarding hospitalization.  This patient presents to the ED for concern of abdominal pain, this involves an extensive number of treatment options, and is a complaint that carries with it a high risk of complications and morbidity.  The differential diagnosis includes diverticulitis, appendicitis   Co morbidities that complicate the patient evaluation  COPD   Additional history obtained:  Additional history obtained from patient External records from outside source obtained and reviewed including hospital record   Lab Tests:  I Ordered, and personally interpreted labs.  The pertinent results include: CBC and chemistries unremarkable   Imaging Studies ordered:  I ordered imaging studies including CT abdomen I independently visualized and interpreted imaging which showed bowel obstruction I agree with the radiologist interpretation   Cardiac Monitoring: / EKG:  The patient was maintained on a cardiac monitor.  I personally viewed and interpreted the cardiac monitored which showed an underlying rhythm of: Normal sinus rhythm   Consultations Obtained:  I requested consultation with the general surgery and hospitalist,  and discussed lab and imaging findings as well as pertinent plan - they recommend: Admit to hospitalist with general surgery consult   Problem List / ED Course / Critical interventions / Medication management  COPD and small bowel obstruction I ordered medication including normal saline for dehydration Reevaluation of the patient after these medicines showed that the  patient improved I have reviewed the patients home medicines and have made adjustments as needed   Social Determinants of Health:  None   Test / Admission - Considered:  None  Patient with a small bowel obstruction.  Patient will be admitted to medicine with general  surgery consulting.  General surgery is requesting an NG tube if he can be put down on the first pass without problems        Final Clinical Impression(s) / ED Diagnoses Final diagnoses:  None    Rx / DC Orders ED Discharge Orders     None         Bethann Berkshire, MD 09/25/22 [phone]

## 2022-09-25 NOTE — Progress Notes (Addendum)
Date and time results received: 09/25/22   Test: INR  Critical Value: 5.2  Name of Provider Notified: Thomes Dinning, MD  Orders Received? Or Actions Taken?: No new orders at this time.

## 2022-09-25 NOTE — Progress Notes (Signed)
Subjective: [name] is a 89 y/o with PMHx of rheumatoid arthritis, hyperlipidemia and recurrent DVT/PE who presented with a 1 day history of N/V and abdominal pain on 09/23/2022.   Overnight she has been feeling great. She denies fever or chills. She states that she has not had any N/V since the NG Tube was removed. She had two bowel movements overnight - one "really good" one that was mostly solid and one smaller one.   Objective: Vital signs in last 24 hours: Temp:  [98.1 F (36.7 C)-99.7 F (37.6 C)] 98.1 F (36.7 C) (05/08 0452) Pulse Rate:  [74-88] 74 (05/08 0452) Resp:  [17-20] 20 (05/08 0452) BP: (105-125)/(60-71) 105/60 (05/08 0452) SpO2:  [95 %-99 %] 97 % (05/08 0452) Last BM Date : 09/24/22  Intake/Output from previous day: 05/07 0701 - 05/08 0700 In: 986.2 [I.V.:953.5; IV Piggyback:32.7] Out: -  Intake/Output this shift: No intake/output data recorded.  Physical Exam HENT:     Head: Normocephalic and atraumatic.  Cardiovascular:     Rate and Rhythm: Normal rate and regular rhythm.     Heart sounds: Normal heart sounds.  Pulmonary:     Effort: Pulmonary effort is normal.     Breath sounds: Normal breath sounds.  Abdominal:     General: Bowel sounds are normal. There is no distension.     Palpations: Abdomen is soft.     Tenderness: There is no abdominal tenderness.  Skin:    General: Skin is warm and dry.  Neurological:     Mental Status: She is alert.  Psychiatric:        Mood and Affect: Mood normal.        Behavior: Behavior normal.      Lab Results:  Recent Labs    09/23/22 0747 09/24/22 0402  WBC 6.2 6.5  HGB 12.1 12.1  HCT 37.5 37.8  PLT 423* 445*   BMET Recent Labs    09/24/22 0402 09/25/22 0414  NA 136 135  K 3.5 3.6  CL 106 106  CO2 22 22  GLUCOSE 75 72  BUN 9 8  CREATININE 0.59 0.58  CALCIUM 8.3* 8.3*   PT/INR Recent Labs    09/24/22 0402 09/25/22 0414  LABPROT 42.5* 47.8*  INR 4.5* 5.2*     Studies/Results: DG Abd 1 View  Result Date: 09/24/2022 CLINICAL DATA:  [id number] SBO (small bowel obstruction) (HCC) [id number] EXAM: ABDOMEN - 1 VIEW COMPARISON:  09/23/2022 CT FINDINGS: NG tube tip in the proximal stomach. No significant bowel dilatation by plain radiography. Air and stool noted throughout the nondistended colon. Degenerative changes of the lumbosacral spine and SI joints. No acute osseous finding. No abnormal calcifications. Aortoiliac atherosclerosis present. IMPRESSION: 1. NG tube tip in the proximal stomach. 2. Nonobstructive bowel gas pattern by plain radiography. Electronically Signed   By: Judie Petit.  Shick M.D.   On: 09/24/2022 13:37   DG Chest Portable 1 View  Result Date: 09/23/2022 CLINICAL DATA:  NGT placement EXAM: PORTABLE CHEST 1 VIEW COMPARISON:  CXR 07/30/22 FINDINGS: No pleural effusion. No pneumothorax. Normal cardiac and mediastinal contours. No focal airspace opacity. Enteric tube with the tip in the proximal stomach and the side hole above the level of the GE junction. Recommend advancement. Contrast opacified renal collecting system. IMPRESSION: Enteric tube with the tip in the proximal stomach and the side hole above the level of the GE junction. Recommend advancement. Electronically Signed   By: Lorenza Cambridge M.D.   On: 09/23/2022 12:33  CT ABDOMEN PELVIS W CONTRAST  Result Date: 09/23/2022 CLINICAL DATA:  Mid abdominal pain, nausea, and vomiting. EXAM: CT ABDOMEN AND PELVIS WITH CONTRAST TECHNIQUE: Multidetector CT imaging of the abdomen and pelvis was performed using the standard protocol following bolus administration of intravenous contrast. RADIATION DOSE REDUCTION: This exam was performed according to the departmental dose-optimization program which includes automated exposure control, adjustment of the mA and/or kV according to patient size and/or use of iterative reconstruction technique. CONTRAST:  75mL OMNIPAQUE IOHEXOL 300 MG/ML  SOLN COMPARISON:  CT abdomen and  pelvis 11/29/2021 FINDINGS: Lower chest: No acute abnormality. Hepatobiliary: No focal liver abnormality. Two small stones in the gallbladder. No biliary dilatation. Pancreas: Unremarkable. Spleen: Unremarkable. Adrenals/Urinary Tract: Unremarkable right adrenal gland. Unchanged left adrenal calcification for which no follow-up imaging is recommend. No renal calculi or hydronephrosis. No suspicious renal mass. Unremarkable bladder. Stomach/Bowel: The stomach is mildly distended by fluid. There are numerous loops of fluid-filled, mildly dilated small bowel measuring up to 3 cm in diameter. There is a transition to decompressed distal small bowel in the pelvis with some of these pelvic small bowel loops appearing mildly thick-walled. The colon is nondilated. Vascular/Lymphatic: Abdominal aortic atherosclerosis without aneurysm. No enlarged lymph nodes. Reproductive: Status post hysterectomy.  No pelvic mass. Other: Small volume abdominal and pelvic ascites. No organized fluid collection. No pneumoperitoneum. Musculoskeletal: No suspicious osseous lesion. Asymmetrically advanced disc degeneration on the left at L4-5 and on the right at L5-S1 with grade 1 anterolisthesis at the former. IMPRESSION: 1. Fluid-filled dilated small bowel loops consistent with obstruction with transition point in the pelvis and mild inflammation of some pelvic small bowel loops. 2. Small volume ascites. 3. Cholelithiasis. 4.  Aortic Atherosclerosis (ICD10-I70.0). Electronically Signed   By: Sebastian Ache M.D.   On: 09/23/2022 10:04    Anti-infectives: Anti-infectives (From admission, onward)    None       Assessment/Plan: [name] is a 89 y/o with PMHx of rheumatoid arthritis, hyperlipidemia and recurrent DVT/PE who presented with a 1 day history of N/V and abdominal pain on 09/23/2022 and was admitted for SBO.   #SBO Patient did not have N/V after removing NG Tube which is reassuring. She is currently eating ice chips  without difficulty. She will not need surgery, as her SBO is resolving well on its own with conservative management as evidenced from her multiple bowel movements. I think she can be transitioned to soft diet today to see how she tolerates PO intake and monitored throughout the afternoon. From a surgical standpoint, I believe it is okay to sign off.    LOS: 2 days    Oscar La 09/25/2022

## 2022-09-25 NOTE — Progress Notes (Addendum)
PROGRESS NOTE  Cheryl Swanson ZOX:[id number] DOB: 15-Dec-1931 DOA: 09/23/2022 PCP: [name], MD  Brief History:  89 year old female with a history of recurrent DVT/PE, glaucoma, hyperlipidemia, rheumatoid arthritis, COPD presenting with 1 day history of abdominal pain with nausea and vomiting.  The patient states that she has been in her usual state of health before she began developing the above symptoms in the late evening on 09/22/2022.  She had innumerable episodes of vomiting and continued to vomit throughout the night and into this morning.  Because of continued abdominal pain, she presented for further evaluation and treatment.  The patient states that she did have a small bowel movement on the evening of 09/22/2022 with a little bit of relief, but her symptoms subsequently returned. Patient denies fevers, chills, headache, chest pain, dyspnea, nausea, vomiting, diarrhea, dysuria, hematuria, hematochezia, and melena.  The patient denies any recent surgeries.  She denies any new medications in the past month.  She denies any over-the-counter medicines that are new. In the ED, the patient was afebrile and hemodynamically stable with oxygen saturation 100% room air.  BMP showed a sodium 137, potassium 3.4, bicarbonate 24, serum creatinine 0.56.  LFTs were unremarkable.  WBC 6.2, hemoglobin 12.1, platelets 423,000.  CT of the abdomen and pelvis showed fluid-filled dilated small bowel loops consistent with obstruction with transition point in the pelvis.  There was mild inflammation of the pelvic small bowel loops.  General surgery was consulted.  The patient was started on IV fluids.    Assessment/Plan: Small bowel obstruction -09/23/2022 CT abdomen as discussed above -General surgery consulted - NG decompression -advancing diet as now having BMs    Supratherapeutic INR -Presented with INR 4.6 now up to 5.2 -no current bleeding complications -continue to hold warfarin -No recent  new medications -Likely due to poor oral intake but starting back diet 5/8 -follow PT/INR   Recurrent DVT/PE -Holding warfarin as discussed above   Rheumatoid arthritis -Patient states that her PCP stopped methotrexate about 5 months prior to this admission   Hyperlipidemia -Restart statin once able to tolerate p.o.    Family Communication:    Consultants:  general surgery  Code Status:  FULL   DVT Prophylaxis:  coumadin   Procedures: As Listed in Progress Note Above  Antibiotics: None  Subjective: C/o wanting to eat. Pt had 2 BMs since yesterday.   Objective: Vitals:   09/24/22 1300 09/24/22 2035 09/25/22 0452 09/25/22 1207  BP: 125/71 120/67 105/60 108/72  Pulse: 88 84 74 90  Resp: 17 18 20 16   Temp: 98.5 F (36.9 C) 99.7 F (37.6 C) 98.1 F (36.7 C) 98.8 F (37.1 C)  TempSrc: Oral Oral Oral Oral  SpO2: 99% 95% 97% 99%  Weight:      Height:        Intake/Output Summary (Last 24 hours) at 09/25/2022 1323 Last data filed at 09/25/2022 0741 Gross per 24 hour  Intake 411.39 ml  Output --  Net 411.39 ml   Weight change:  Exam:  General:  frail, elderly female, Pt is alert, follows commands appropriately, not in acute distress HEENT: No icterus, No thrush, No neck mass, [place]/AT Cardiovascular: normal S1/S2, no rubs, no gallops Respiratory: CTA bilaterally, no wheezing, no crackles, no rhonchi Abdomen: Soft/+BS, mild periumbilical tender, non distended, no guarding Extremities: No edema, No lymphangitis, No petechiae, No rashes, no synovitis  Data Reviewed: I have personally reviewed following labs and imaging studies  Basic Metabolic Panel: Recent Labs  Lab 09/19/22 1337 09/23/22 0747 09/24/22 0402 09/24/22 0429 09/25/22 0414  NA 133* 137 136  --  135  K 3.6 3.4* 3.5  --  3.6  CL 101 105 106  --  106  CO2 25 24 22   --  22  GLUCOSE 104* 114* 75  --  72  BUN 9 10 9   --  8  CREATININE 0.60 0.56 0.59  --  0.58  CALCIUM 9.0 8.9 8.3*  --  8.3*  MG  --    --   --  2.1 2.0  PHOS  --   --   --  2.0* 2.2*   Liver Function Tests: Recent Labs  Lab 09/19/22 1337 09/23/22 0747  AST 21 19  ALT 10 10  ALKPHOS 59 54  BILITOT 1.0 1.0  PROT 7.0 7.0  ALBUMIN 3.0* 3.0*   Recent Labs  Lab 09/23/22 0747  LIPASE 39   No results for input(s): "AMMONIA" in the last 168 hours. Coagulation Profile: Recent Labs  Lab 09/19/22 1337 09/23/22 0747 09/24/22 0402 09/25/22 0414  INR 3.6* 4.6* 4.5* 5.2*   CBC: Recent Labs  Lab 09/19/22 1337 09/23/22 0747 09/24/22 0402  WBC 5.1 6.2 6.5  NEUTROABS 2.8 4.9  --   HGB 12.1 12.1 12.1  HCT 37.5 37.5 37.8  MCV 92.8 92.8 94.3  PLT 396 423* 445*   Cardiac Enzymes: No results for input(s): "CKTOTAL", "CKMB", "CKMBINDEX", "TROPONINI" in the last 168 hours. BNP: Invalid input(s): "POCBNP" CBG: No results for input(s): "GLUCAP" in the last 168 hours. HbA1C: No results for input(s): "HGBA1C" in the last 72 hours. Urine analysis:    Component Value Date/Time   COLORURINE RED (A) 11/29/2021 2206   APPEARANCEUR CLOUDY (A) 11/29/2021 2206   LABSPEC 1.011 11/29/2021 2206   PHURINE 7.0 11/29/2021 2206   GLUCOSEU NEGATIVE 11/29/2021 2206   HGBUR LARGE (A) 11/29/2021 2206   BILIRUBINUR NEGATIVE 11/29/2021 2206   KETONESUR NEGATIVE 11/29/2021 2206   PROTEINUR 100 (A) 11/29/2021 2206   NITRITE NEGATIVE 11/29/2021 2206   LEUKOCYTESUR NEGATIVE 11/29/2021 2206    No results found for this or any previous visit (from the past 240 hour(s)).   Scheduled Meds: Continuous Infusions:  0.9 % NaCl with KCl 20 mEq / L 50 mL/hr at 09/25/22 1610    Procedures/Studies: DG Abd 1 View  Result Date: 09/24/2022 CLINICAL DATA:  [id number] SBO (small bowel obstruction) (HCC) [id number] EXAM: ABDOMEN - 1 VIEW COMPARISON:  09/23/2022 CT FINDINGS: NG tube tip in the proximal stomach. No significant bowel dilatation by plain radiography. Air and stool noted throughout the nondistended colon. Degenerative changes of the  lumbosacral spine and SI joints. No acute osseous finding. No abnormal calcifications. Aortoiliac atherosclerosis present. IMPRESSION: 1. NG tube tip in the proximal stomach. 2. Nonobstructive bowel gas pattern by plain radiography. Electronically Signed   By: Judie Petit.  Shick M.D.   On: 09/24/2022 13:37   DG Chest Portable 1 View  Result Date: 09/23/2022 CLINICAL DATA:  NGT placement EXAM: PORTABLE CHEST 1 VIEW COMPARISON:  CXR 07/30/22 FINDINGS: No pleural effusion. No pneumothorax. Normal cardiac and mediastinal contours. No focal airspace opacity. Enteric tube with the tip in the proximal stomach and the side hole above the level of the GE junction. Recommend advancement. Contrast opacified renal collecting system. IMPRESSION: Enteric tube with the tip in the proximal stomach and the side hole above the level of the GE junction. Recommend advancement. Electronically Signed   By:  Lorenza Cambridge M.D.   On: 09/23/2022 12:33   CT ABDOMEN PELVIS W CONTRAST  Result Date: 09/23/2022 CLINICAL DATA:  Mid abdominal pain, nausea, and vomiting. EXAM: CT ABDOMEN AND PELVIS WITH CONTRAST TECHNIQUE: Multidetector CT imaging of the abdomen and pelvis was performed using the standard protocol following bolus administration of intravenous contrast. RADIATION DOSE REDUCTION: This exam was performed according to the departmental dose-optimization program which includes automated exposure control, adjustment of the mA and/or kV according to patient size and/or use of iterative reconstruction technique. CONTRAST:  75mL OMNIPAQUE IOHEXOL 300 MG/ML  SOLN COMPARISON:  CT abdomen and pelvis 11/29/2021 FINDINGS: Lower chest: No acute abnormality. Hepatobiliary: No focal liver abnormality. Two small stones in the gallbladder. No biliary dilatation. Pancreas: Unremarkable. Spleen: Unremarkable. Adrenals/Urinary Tract: Unremarkable right adrenal gland. Unchanged left adrenal calcification for which no follow-up imaging is recommend. No renal  calculi or hydronephrosis. No suspicious renal mass. Unremarkable bladder. Stomach/Bowel: The stomach is mildly distended by fluid. There are numerous loops of fluid-filled, mildly dilated small bowel measuring up to 3 cm in diameter. There is a transition to decompressed distal small bowel in the pelvis with some of these pelvic small bowel loops appearing mildly thick-walled. The colon is nondilated. Vascular/Lymphatic: Abdominal aortic atherosclerosis without aneurysm. No enlarged lymph nodes. Reproductive: Status post hysterectomy.  No pelvic mass. Other: Small volume abdominal and pelvic ascites. No organized fluid collection. No pneumoperitoneum. Musculoskeletal: No suspicious osseous lesion. Asymmetrically advanced disc degeneration on the left at L4-5 and on the right at L5-S1 with grade 1 anterolisthesis at the former. IMPRESSION: 1. Fluid-filled dilated small bowel loops consistent with obstruction with transition point in the pelvis and mild inflammation of some pelvic small bowel loops. 2. Small volume ascites. 3. Cholelithiasis. 4.  Aortic Atherosclerosis (ICD10-I70.0). Electronically Signed   By: Sebastian Ache M.D.   On: 09/23/2022 10:04   ECHOCARDIOGRAM COMPLETE  Result Date: 09/20/2022    ECHOCARDIOGRAM REPORT   Patient Name:   JULLIA SILAS Date of Exam: 09/20/2022 Medical Rec #:  [id number]      Height:       66.0 in Accession #:    [id number]     Weight:       120.0 lb Date of Birth:  March 18, 1932       BSA:          1.610 m Patient Age:    89 years       BP:           129/68 mmHg Patient Gender: F              HR:           68 bpm. Exam Location:  Outpatient Procedure: 2D Echo, 3D Echo, Cardiac Doppler, Color Doppler and Strain Analysis Indications:     R60.0 Lower extremity edema  History:         Patient has prior history of Echocardiogram examinations, most                  recent 03/13/2020. Risk Factors:Dyslipidemia. Pulmonary                  embolus.  Sonographer:     Sheralyn Boatman RDCS Referring  Phys:  [id number] [name] Diagnosing Phys: Weston Brass MD  Sonographer Comments: Technically difficult study due to poor echo windows, suboptimal parasternal window, suboptimal apical window and suboptimal subcostal window. Extremely difficult windows. Patient with limited mobility of extremities due to RA. IMPRESSIONS  1. Left ventricular ejection fraction, by estimation, is 55%. The left ventricle has normal function. The left ventricle has no regional wall motion abnormalities. Left ventricular diastolic parameters are indeterminate. There is abnormal septal motion.  2. Right ventricular systolic function is normal. The right ventricular size is normal. There is mildly elevated pulmonary artery systolic pressure. The estimated right ventricular systolic pressure is 37.3 mmHg.  3. Mild postinflammatory appearance to mitral valve opening. Submitral chordal calcifications. The mitral valve is abnormal. Mild mitral valve regurgitation. Mild mitral stenosis. The mean mitral valve gradient is 5.0 mmHg with average heart rate of 71 bpm.  4. The aortic valve is grossly normal. There is mild calcification of the aortic valve. Aortic valve regurgitation is trivial.  5. The inferior vena cava is dilated in size with <50% respiratory variability, suggesting right atrial pressure of 15 mmHg. FINDINGS  Left Ventricle: Left ventricular ejection fraction, by estimation, is 55%. The left ventricle has normal function. The left ventricle has no regional wall motion abnormalities. Global longitudinal strain performed but not reported based on interpreter judgement due to suboptimal tracking. 3D ejection fraction reviewed and evaluated as part of the interpretation. Alternate measurement of EF is felt to be most reflective of LV function. The left ventricular internal cavity size was normal in size. There  is no left ventricular hypertrophy. Abnormal septal motion. Left ventricular diastolic parameters are indeterminate. Right  Ventricle: The right ventricular size is normal. No increase in right ventricular wall thickness. Right ventricular systolic function is normal. There is mildly elevated pulmonary artery systolic pressure. The tricuspid regurgitant velocity is 2.36  m/s, and with an assumed right atrial pressure of 15 mmHg, the estimated right ventricular systolic pressure is 37.3 mmHg. Left Atrium: Left atrial size was normal in size. Right Atrium: Right atrial size was normal in size. Pericardium: There is no evidence of pericardial effusion. Mitral Valve: Mild postinflammatory appearance to mitral valve opening. Submitral chordal calcifications. The mitral valve is abnormal. Mildly decreased mobility of the mitral valve leaflets. Mild mitral valve regurgitation. Mild mitral valve stenosis. MV peak gradient, 9.5 mmHg. The mean mitral valve gradient is 5.0 mmHg with average heart rate of 71 bpm. Tricuspid Valve: The tricuspid valve is normal in structure. Tricuspid valve regurgitation is mild. Aortic Valve: The aortic valve is grossly normal. There is mild calcification of the aortic valve. Aortic valve regurgitation is trivial. Aortic valve mean gradient measures 5.0 mmHg. Aortic valve peak gradient measures 9.0 mmHg. Aortic valve area, by VTI measures 2.23 cm. Pulmonic Valve: The pulmonic valve was not well visualized. Pulmonic valve regurgitation is not visualized. Aorta: The aortic root is normal in size and structure. Venous: The inferior vena cava is dilated in size with less than 50% respiratory variability, suggesting right atrial pressure of 15 mmHg. IAS/Shunts: No atrial level shunt detected by color flow Doppler.  LEFT VENTRICLE PLAX 2D LVIDd:         3.60 cm     Diastology LVIDs:         2.50 cm     LV e' medial:    6.15 cm/s LV PW:         0.80 cm     LV E/e' medial:  20.3 LV IVS:        0.80 cm     LV e' lateral:   7.72 cm/s LVOT diam:     2.20 cm     LV E/e' lateral: 16.2 LV SV:  78 LV SV Index:   48 LVOT  Area:     3.80 cm                             3D Volume EF: LV Volumes (MOD)           3D EF:        58 % LV vol d, MOD A2C: 50.0 ml LV EDV:       103 ml LV vol d, MOD A4C: 64.4 ml LV ESV:       43 ml LV vol s, MOD A2C: 18.0 ml LV SV:        60 ml LV vol s, MOD A4C: 27.2 ml LV SV MOD A2C:     32.0 ml LV SV MOD A4C:     64.4 ml LV SV MOD BP:      34.9 ml RIGHT VENTRICLE             IVC RV S prime:     11.60 cm/s  IVC diam: 2.70 cm TAPSE (M-mode): 2.2 cm LEFT ATRIUM             Index        RIGHT ATRIUM          Index LA diam:        3.20 cm 1.99 cm/m   RA Area:     6.00 cm LA Vol (A2C):   7.2 ml  4.49 ml/m   RA Volume:   9.33 ml  5.80 ml/m LA Vol (A4C):   17.3 ml 10.75 ml/m LA Biplane Vol: 11.8 ml 7.33 ml/m  AORTIC VALVE AV Area (Vmax):    2.50 cm AV Area (Vmean):   2.52 cm AV Area (VTI):     2.23 cm AV Vmax:           150.00 cm/s AV Vmean:          100.000 cm/s AV VTI:            0.347 m AV Peak Grad:      9.0 mmHg AV Mean Grad:      5.0 mmHg LVOT Vmax:         98.50 cm/s LVOT Vmean:        66.200 cm/s LVOT VTI:          0.204 m LVOT/AV VTI ratio: 0.59  AORTA Ao Root diam: 2.90 cm Ao Asc diam:  2.60 cm MITRAL VALVE                TRICUSPID VALVE MV Area (PHT): 2.50 cm     TR Peak grad:   22.3 mmHg MV Area VTI:   1.54 cm     TR Vmax:        236.00 cm/s MV Peak grad:  9.5 mmHg MV Mean grad:  5.0 mmHg     SHUNTS MV Vmax:       1.54 m/s     Systemic VTI:  0.20 m MV Vmean:      101.0 cm/s   Systemic Diam: 2.20 cm MV Decel Time: 303 msec MV E velocity: 125.00 cm/s MV A velocity: 132.00 cm/s MV E/A ratio:  0.95 Weston Brass MD Electronically signed by Weston Brass MD Signature Date/Time: 09/20/2022/11:25:37 AM    Final (Updated)     Standley Dakins, MD  [facility]  If 7PM-7AM, please contact night-coverage [email or web address] Password [email or web address] 09/25/2022, 1:23 PM  LOS: 2 days

## 2022-09-26 DIAGNOSIS — R791 Abnormal coagulation profile: Secondary | ICD-10-CM | POA: Diagnosis not present

## 2022-09-26 DIAGNOSIS — K56609 Unspecified intestinal obstruction, unspecified as to partial versus complete obstruction: Secondary | ICD-10-CM | POA: Diagnosis not present

## 2022-09-26 LAB — PROTIME-INR
INR: 4.4 (ref 0.8–1.2)
Prothrombin Time: 42 seconds — ABNORMAL HIGH (ref 11.4–15.2)

## 2022-09-26 MED ORDER — FUROSEMIDE 40 MG PO TABS: 40.0000 mg | ORAL_TABLET | Freq: Every day | ORAL | Status: DC

## 2022-09-26 MED ORDER — HYDROCODONE-ACETAMINOPHEN 5-325 MG PO TABS: 2.0000 | ORAL_TABLET | Freq: Four times a day (QID) | ORAL | Status: AC | PRN

## 2022-09-26 NOTE — Progress Notes (Signed)
Patient discharged from hospital via wheelchair. Daughter accompanying patient. Discharge information given to patient and daughter; education provided. Verbalizes understanding.

## 2022-09-26 NOTE — Discharge Summary (Signed)
Physician Discharge Summary  Cheryl Swanson:[id number] DOB: 16-Sep-1931 DOA: 09/23/2022  PCP: [name], MD  Admit date: 09/23/2022 Discharge date: 09/26/2022  Admitted From:  Home  Disposition: Home with Home Health   Recommendations for Outpatient Follow-up:  Follow up with PCP in 5 days Please check PT/INR and adjust warfarin dose as needed Hold warfarin until INR less than 3 Home health Nurse to check PT/INR on 5/11, 5/13 and report results to primary care provider Pt to hold warfarin (DO not take warfarin) until INR is less than 3.   Please follow up with primary care provider in 5 days to have PT/INR checked and to discuss restarting warfarin Please obtain phosphorus, CBC, BMP in 1-2 weeks Please order outpatient palliative medicine consultation   Home Health:  RN [facility] to check PT/INR on 5/11, 5/13)  Discharge Condition: STABLE   CODE STATUS: FULL   Diet: vitamin K consistent - soft foods recommended  Brief Hospitalization Summary: Please see all hospital notes, images, labs for full details of the hospitalization. ADMISSION PROVIDER HPI:  89 year old female with a history of recurrent DVT/PE, glaucoma, hyperlipidemia, rheumatoid arthritis, COPD presenting with 1 day history of abdominal pain with nausea and vomiting.  The patient states that she has been in her usual state of health before she began developing the above symptoms in the late evening on 09/22/2022.  She had innumerable episodes of vomiting and continued to vomit throughout the night and into this morning.  Because of continued abdominal pain, she presented for further evaluation and treatment.  The patient states that she did have a small bowel movement on the evening of 09/22/2022 with a little bit of relief, but her symptoms subsequently returned. Patient denies fevers, chills, headache, chest pain, dyspnea, nausea, vomiting, diarrhea, dysuria, hematuria, hematochezia, and melena.   The patient denies any recent  surgeries.  She denies any new medications in the past month.  She denies any over-the-counter medicines that are new. In the ED, the patient was afebrile and hemodynamically stable with oxygen saturation 100% room air.  BMP showed a sodium 137, potassium 3.4, bicarbonate 24, serum creatinine 0.56.  LFTs were unremarkable.  WBC 6.2, hemoglobin 12.1, platelets 423,000.  CT of the abdomen and pelvis showed fluid-filled dilated small bowel loops consistent with obstruction with transition point in the pelvis.  There was mild inflammation of the pelvic small bowel loops.  General surgery was consulted.  The patient was started on IV fluids.  HOSPITAL COURSE BY PROBLEM LIST  Small bowel obstruction - RESOLVED  -09/23/2022 CT abdomen as discussed above -General surgery consulted but surgery was not required  - NG decompression with good results  -advancing diet as now having BMs and tolerating diet well -discussed with surgery, ok to DC home today, follow up with PCP     Supratherapeutic INR -Presented with INR 4.6 now down to 4.4 with holding warfarin  -no current bleeding complications -continue to hold warfarin until INR<3 -arranged for home health RN to check PT/INR on 5/11, 5/13 and report to PCP -vitamin K consistent diet    Recurrent DVT/PE -Holding warfarin as discussed above due to INR 4.4    Rheumatoid arthritis -Patient states that her PCP stopped methotrexate about 5 months prior to this admission   Hyperlipidemia -Restart statin at discharge     Family Communication:     Consultants:  general surgery   Code Status:  FULL    DVT Prophylaxis:  warfarin     Discharge  Diagnoses:  Principal Problem:   Small bowel obstruction (HCC) Active Problems:   Rheumatoid arthritis (HCC)   Supratherapeutic INR   Discharge Instructions:  Allergies as of 09/26/2022   No Known Allergies      Medication List     STOP taking these medications    LORazepam 0.5 MG tablet Commonly  known as: ATIVAN   warfarin 2.5 MG tablet Commonly known as: COUMADIN   warfarin 5 MG tablet Commonly known as: COUMADIN       TAKE these medications    albuterol 108 (90 Base) MCG/ACT inhaler Commonly known as: VENTOLIN HFA Inhale 2 puffs into the lungs every 4 (four) hours as needed for wheezing or shortness of breath.   calcium-vitamin D 500-200 MG-UNIT tablet Commonly known as: OSCAL WITH D Take 1 tablet by mouth 2 (two) times daily.   carboxymethylcellulose 0.5 % Soln Commonly known as: REFRESH PLUS Place 1 drop into both eyes 2 (two) times daily as needed (dry eyes).   dorzolamide-timolol 2-0.5 % ophthalmic solution Commonly known as: COSOPT Place 1 drop into the right eye 2 (two) times daily.   furosemide 40 MG tablet Commonly known as: LASIX Take 1 tablet (40 mg total) by mouth daily. Start taking on: Sep 30, 2022 What changed: These instructions start on Sep 30, 2022. If you are unsure what to do until then, ask your doctor or other care provider.   HYDROcodone-acetaminophen 5-325 MG tablet Commonly known as: NORCO/VICODIN Take 2 tablets by mouth every 6 (six) hours as needed for moderate pain.   latanoprost 0.005 % ophthalmic solution Commonly known as: XALATAN Place 1 drop into both eyes at bedtime.   potassium chloride SA 20 MEQ tablet Commonly known as: KLOR-CON M Take 20 mEq by mouth daily.   simvastatin 20 MG tablet Commonly known as: ZOCOR Take 20 mg by mouth every evening.        Follow-up Information     [facility] Follow [facility].   Why: [facility] staff will call you to schedule in home nursing visits        [name], MD. Schedule an appointment as soon as possible for a visit in 5 day(s).   Specialty: Family Medicine Why: Hospital Follow Up, recheck labs, PT/INR Contact information: [address] [place] [address] [phone]                No Known Allergies Allergies as of  09/26/2022   No Known Allergies      Medication List     STOP taking these medications    LORazepam 0.5 MG tablet Commonly known as: ATIVAN   warfarin 2.5 MG tablet Commonly known as: COUMADIN   warfarin 5 MG tablet Commonly known as: COUMADIN       TAKE these medications    albuterol 108 (90 Base) MCG/ACT inhaler Commonly known as: VENTOLIN HFA Inhale 2 puffs into the lungs every 4 (four) hours as needed for wheezing or shortness of breath.   calcium-vitamin D 500-200 MG-UNIT tablet Commonly known as: OSCAL WITH D Take 1 tablet by mouth 2 (two) times daily.   carboxymethylcellulose 0.5 % Soln Commonly known as: REFRESH PLUS Place 1 drop into both eyes 2 (two) times daily as needed (dry eyes).   dorzolamide-timolol 2-0.5 % ophthalmic solution Commonly known as: COSOPT Place 1 drop into the right eye 2 (two) times daily.   furosemide 40 MG tablet Commonly known as: LASIX Take 1 tablet (40 mg total)  by mouth daily. Start taking on: Sep 30, 2022 What changed: These instructions start on Sep 30, 2022. If you are unsure what to do until then, ask your doctor or other care provider.   HYDROcodone-acetaminophen 5-325 MG tablet Commonly known as: NORCO/VICODIN Take 2 tablets by mouth every 6 (six) hours as needed for moderate pain.   latanoprost 0.005 % ophthalmic solution Commonly known as: XALATAN Place 1 drop into both eyes at bedtime.   potassium chloride SA 20 MEQ tablet Commonly known as: KLOR-CON M Take 20 mEq by mouth daily.   simvastatin 20 MG tablet Commonly known as: ZOCOR Take 20 mg by mouth every evening.        Procedures/Studies: DG Abd 1 View  Result Date: 09/24/2022 CLINICAL DATA:  [id number] SBO (small bowel obstruction) (HCC) [id number] EXAM: ABDOMEN - 1 VIEW COMPARISON:  09/23/2022 CT FINDINGS: NG tube tip in the proximal stomach. No significant bowel dilatation by plain radiography. Air and stool noted throughout the nondistended colon.  Degenerative changes of the lumbosacral spine and SI joints. No acute osseous finding. No abnormal calcifications. Aortoiliac atherosclerosis present. IMPRESSION: 1. NG tube tip in the proximal stomach. 2. Nonobstructive bowel gas pattern by plain radiography. Electronically Signed   By: Judie Petit.  Shick M.D.   On: 09/24/2022 13:37   DG Chest Portable 1 View  Result Date: 09/23/2022 CLINICAL DATA:  NGT placement EXAM: PORTABLE CHEST 1 VIEW COMPARISON:  CXR 07/30/22 FINDINGS: No pleural effusion. No pneumothorax. Normal cardiac and mediastinal contours. No focal airspace opacity. Enteric tube with the tip in the proximal stomach and the side hole above the level of the GE junction. Recommend advancement. Contrast opacified renal collecting system. IMPRESSION: Enteric tube with the tip in the proximal stomach and the side hole above the level of the GE junction. Recommend advancement. Electronically Signed   By: Lorenza Cambridge M.D.   On: 09/23/2022 12:33   CT ABDOMEN PELVIS W CONTRAST  Result Date: 09/23/2022 CLINICAL DATA:  Mid abdominal pain, nausea, and vomiting. EXAM: CT ABDOMEN AND PELVIS WITH CONTRAST TECHNIQUE: Multidetector CT imaging of the abdomen and pelvis was performed using the standard protocol following bolus administration of intravenous contrast. RADIATION DOSE REDUCTION: This exam was performed according to the departmental dose-optimization program which includes automated exposure control, adjustment of the mA and/or kV according to patient size and/or use of iterative reconstruction technique. CONTRAST:  75mL OMNIPAQUE IOHEXOL 300 MG/ML  SOLN COMPARISON:  CT abdomen and pelvis 11/29/2021 FINDINGS: Lower chest: No acute abnormality. Hepatobiliary: No focal liver abnormality. Two small stones in the gallbladder. No biliary dilatation. Pancreas: Unremarkable. Spleen: Unremarkable. Adrenals/Urinary Tract: Unremarkable right adrenal gland. Unchanged left adrenal calcification for which no follow-up  imaging is recommend. No renal calculi or hydronephrosis. No suspicious renal mass. Unremarkable bladder. Stomach/Bowel: The stomach is mildly distended by fluid. There are numerous loops of fluid-filled, mildly dilated small bowel measuring up to 3 cm in diameter. There is a transition to decompressed distal small bowel in the pelvis with some of these pelvic small bowel loops appearing mildly thick-walled. The colon is nondilated. Vascular/Lymphatic: Abdominal aortic atherosclerosis without aneurysm. No enlarged lymph nodes. Reproductive: Status post hysterectomy.  No pelvic mass. Other: Small volume abdominal and pelvic ascites. No organized fluid collection. No pneumoperitoneum. Musculoskeletal: No suspicious osseous lesion. Asymmetrically advanced disc degeneration on the left at L4-5 and on the right at L5-S1 with grade 1 anterolisthesis at the former. IMPRESSION: 1. Fluid-filled dilated small bowel loops consistent with obstruction with transition point  in the pelvis and mild inflammation of some pelvic small bowel loops. 2. Small volume ascites. 3. Cholelithiasis. 4.  Aortic Atherosclerosis (ICD10-I70.0). Electronically Signed   By: Sebastian Ache M.D.   On: 09/23/2022 10:04   ECHOCARDIOGRAM COMPLETE  Result Date: 09/20/2022    ECHOCARDIOGRAM REPORT   Patient Name:   Cheryl Swanson Date of Exam: 09/20/2022 Medical Rec #:  [id number]      Height:       66.0 in Accession #:    [id number]     Weight:       120.0 lb Date of Birth:  1931/07/16       BSA:          1.610 m Patient Age:    89 years       BP:           129/68 mmHg Patient Gender: F              HR:           68 bpm. Exam Location:  Outpatient Procedure: 2D Echo, 3D Echo, Cardiac Doppler, Color Doppler and Strain Analysis Indications:     R60.0 Lower extremity edema  History:         Patient has prior history of Echocardiogram examinations, most                  recent 03/13/2020. Risk Factors:Dyslipidemia. Pulmonary                  embolus.   Sonographer:     Sheralyn Boatman RDCS Referring Phys:  [id number] [name] Diagnosing Phys: Weston Brass MD  Sonographer Comments: Technically difficult study due to poor echo windows, suboptimal parasternal window, suboptimal apical window and suboptimal subcostal window. Extremely difficult windows. Patient with limited mobility of extremities due to RA. IMPRESSIONS  1. Left ventricular ejection fraction, by estimation, is 55%. The left ventricle has normal function. The left ventricle has no regional wall motion abnormalities. Left ventricular diastolic parameters are indeterminate. There is abnormal septal motion.  2. Right ventricular systolic function is normal. The right ventricular size is normal. There is mildly elevated pulmonary artery systolic pressure. The estimated right ventricular systolic pressure is 37.3 mmHg.  3. Mild postinflammatory appearance to mitral valve opening. Submitral chordal calcifications. The mitral valve is abnormal. Mild mitral valve regurgitation. Mild mitral stenosis. The mean mitral valve gradient is 5.0 mmHg with average heart rate of 71 bpm.  4. The aortic valve is grossly normal. There is mild calcification of the aortic valve. Aortic valve regurgitation is trivial.  5. The inferior vena cava is dilated in size with <50% respiratory variability, suggesting right atrial pressure of 15 mmHg. FINDINGS  Left Ventricle: Left ventricular ejection fraction, by estimation, is 55%. The left ventricle has normal function. The left ventricle has no regional wall motion abnormalities. Global longitudinal strain performed but not reported based on interpreter judgement due to suboptimal tracking. 3D ejection fraction reviewed and evaluated as part of the interpretation. Alternate measurement of EF is felt to be most reflective of LV function. The left ventricular internal cavity size was normal in size. There  is no left ventricular hypertrophy. Abnormal septal motion. Left ventricular  diastolic parameters are indeterminate. Right Ventricle: The right ventricular size is normal. No increase in right ventricular wall thickness. Right ventricular systolic function is normal. There is mildly elevated pulmonary artery systolic pressure. The tricuspid regurgitant velocity is 2.36  m/s, and with an assumed right  atrial pressure of 15 mmHg, the estimated right ventricular systolic pressure is 37.3 mmHg. Left Atrium: Left atrial size was normal in size. Right Atrium: Right atrial size was normal in size. Pericardium: There is no evidence of pericardial effusion. Mitral Valve: Mild postinflammatory appearance to mitral valve opening. Submitral chordal calcifications. The mitral valve is abnormal. Mildly decreased mobility of the mitral valve leaflets. Mild mitral valve regurgitation. Mild mitral valve stenosis. MV peak gradient, 9.5 mmHg. The mean mitral valve gradient is 5.0 mmHg with average heart rate of 71 bpm. Tricuspid Valve: The tricuspid valve is normal in structure. Tricuspid valve regurgitation is mild. Aortic Valve: The aortic valve is grossly normal. There is mild calcification of the aortic valve. Aortic valve regurgitation is trivial. Aortic valve mean gradient measures 5.0 mmHg. Aortic valve peak gradient measures 9.0 mmHg. Aortic valve area, by VTI measures 2.23 cm. Pulmonic Valve: The pulmonic valve was not well visualized. Pulmonic valve regurgitation is not visualized. Aorta: The aortic root is normal in size and structure. Venous: The inferior vena cava is dilated in size with less than 50% respiratory variability, suggesting right atrial pressure of 15 mmHg. IAS/Shunts: No atrial level shunt detected by color flow Doppler.  LEFT VENTRICLE PLAX 2D LVIDd:         3.60 cm     Diastology LVIDs:         2.50 cm     LV e' medial:    6.15 cm/s LV PW:         0.80 cm     LV E/e' medial:  20.3 LV IVS:        0.80 cm     LV e' lateral:   7.72 cm/s LVOT diam:     2.20 cm     LV E/e' lateral:  16.2 LV SV:         78 LV SV Index:   48 LVOT Area:     3.80 cm                             3D Volume EF: LV Volumes (MOD)           3D EF:        58 % LV vol d, MOD A2C: 50.0 ml LV EDV:       103 ml LV vol d, MOD A4C: 64.4 ml LV ESV:       43 ml LV vol s, MOD A2C: 18.0 ml LV SV:        60 ml LV vol s, MOD A4C: 27.2 ml LV SV MOD A2C:     32.0 ml LV SV MOD A4C:     64.4 ml LV SV MOD BP:      34.9 ml RIGHT VENTRICLE             IVC RV S prime:     11.60 cm/s  IVC diam: 2.70 cm TAPSE (M-mode): 2.2 cm LEFT ATRIUM             Index        RIGHT ATRIUM          Index LA diam:        3.20 cm 1.99 cm/m   RA Area:     6.00 cm LA Vol (A2C):   7.2 ml  4.49 ml/m   RA Volume:   9.33 ml  5.80 ml/m LA Vol (A4C):   17.3 ml 10.75  ml/m LA Biplane Vol: 11.8 ml 7.33 ml/m  AORTIC VALVE AV Area (Vmax):    2.50 cm AV Area (Vmean):   2.52 cm AV Area (VTI):     2.23 cm AV Vmax:           150.00 cm/s AV Vmean:          100.000 cm/s AV VTI:            0.347 m AV Peak Grad:      9.0 mmHg AV Mean Grad:      5.0 mmHg LVOT Vmax:         98.50 cm/s LVOT Vmean:        66.200 cm/s LVOT VTI:          0.204 m LVOT/AV VTI ratio: 0.59  AORTA Ao Root diam: 2.90 cm Ao Asc diam:  2.60 cm MITRAL VALVE                TRICUSPID VALVE MV Area (PHT): 2.50 cm     TR Peak grad:   22.3 mmHg MV Area VTI:   1.54 cm     TR Vmax:        236.00 cm/s MV Peak grad:  9.5 mmHg MV Mean grad:  5.0 mmHg     SHUNTS MV Vmax:       1.54 m/s     Systemic VTI:  0.20 m MV Vmean:      101.0 cm/s   Systemic Diam: 2.20 cm MV Decel Time: 303 msec MV E velocity: 125.00 cm/s MV A velocity: 132.00 cm/s MV E/A ratio:  0.95 Weston Brass MD Electronically signed by Weston Brass MD Signature Date/Time: 09/20/2022/11:25:37 AM    Final (Updated)      Subjective: Pt having multiple bowel movements.  Pt tolerating diet.  No bleeding events.    Discharge Exam: Vitals:   09/25/22 2031 09/26/22 0544  BP: 117/64 117/75  Pulse: (!) 101   Resp: 17 17  Temp: 98.2 F (36.8 C)  98.2 F (36.8 C)  SpO2: 98% 98%   Vitals:   09/25/22 0452 09/25/22 1207 09/25/22 2031 09/26/22 0544  BP: 105/60 108/72 117/64 117/75  Pulse: 74 90 (!) 101   Resp: 20 16 17 17   Temp: 98.1 F (36.7 C) 98.8 F (37.1 C) 98.2 F (36.8 C) 98.2 F (36.8 C)  TempSrc: Oral Oral Oral   SpO2: 97% 99% 98% 98%  Weight:      Height:       General: Pt is alert, awake, not in acute distress Cardiovascular: RRR, S1/S2 +, no rubs, no gallops Respiratory: CTA bilaterally, no wheezing, no rhonchi Abdominal: Soft, NT, ND, bowel sounds + Extremities: no edema, no cyanosis   The results of significant diagnostics from this hospitalization (including imaging, microbiology, ancillary and laboratory) are listed below for reference.     Microbiology: No results found for this or any previous visit (from the past 240 hour(s)).   Labs: BNP (last 3 results) Recent Labs    07/30/22 1553 09/19/22 1337  BNP 26.0 65.0   Basic Metabolic Panel: Recent Labs  Lab 09/19/22 1337 09/23/22 0747 09/24/22 0402 09/24/22 0429 09/25/22 0414  NA 133* 137 136  --  135  K 3.6 3.4* 3.5  --  3.6  CL 101 105 106  --  106  CO2 25 24 22   --  22  GLUCOSE 104* 114* 75  --  72  BUN 9 10 9   --  8  CREATININE 0.60 0.56 0.59  --  0.58  CALCIUM 9.0 8.9 8.3*  --  8.3*  MG  --   --   --  2.1 2.0  PHOS  --   --   --  2.0* 2.2*   Liver Function Tests: Recent Labs  Lab 09/19/22 1337 09/23/22 0747  AST 21 19  ALT 10 10  ALKPHOS 59 54  BILITOT 1.0 1.0  PROT 7.0 7.0  ALBUMIN 3.0* 3.0*   Recent Labs  Lab 09/23/22 0747  LIPASE 39   No results for input(s): "AMMONIA" in the last 168 hours. CBC: Recent Labs  Lab 09/19/22 1337 09/23/22 0747 09/24/22 0402  WBC 5.1 6.2 6.5  NEUTROABS 2.8 4.9  --   HGB 12.1 12.1 12.1  HCT 37.5 37.5 37.8  MCV 92.8 92.8 94.3  PLT 396 423* 445*   Cardiac Enzymes: No results for input(s): "CKTOTAL", "CKMB", "CKMBINDEX", "TROPONINI" in the last 168 hours. BNP: Invalid  input(s): "POCBNP" CBG: No results for input(s): "GLUCAP" in the last 168 hours. D-Dimer No results for input(s): "DDIMER" in the last 72 hours. Hgb A1c No results for input(s): "HGBA1C" in the last 72 hours. Lipid Profile No results for input(s): "CHOL", "HDL", "LDLCALC", "TRIG", "CHOLHDL", "LDLDIRECT" in the last 72 hours. Thyroid function studies No results for input(s): "TSH", "T4TOTAL", "T3FREE", "THYROIDAB" in the last 72 hours.  Invalid input(s): "FREET3" Anemia work up No results for input(s): "VITAMINB12", "FOLATE", "FERRITIN", "TIBC", "IRON", "RETICCTPCT" in the last 72 hours. Urinalysis    Component Value Date/Time   COLORURINE RED (A) 11/29/2021 2206   APPEARANCEUR CLOUDY (A) 11/29/2021 2206   LABSPEC 1.011 11/29/2021 2206   PHURINE 7.0 11/29/2021 2206   GLUCOSEU NEGATIVE 11/29/2021 2206   HGBUR LARGE (A) 11/29/2021 2206   BILIRUBINUR NEGATIVE 11/29/2021 2206   KETONESUR NEGATIVE 11/29/2021 2206   PROTEINUR 100 (A) 11/29/2021 2206   NITRITE NEGATIVE 11/29/2021 2206   LEUKOCYTESUR NEGATIVE 11/29/2021 2206   Sepsis Labs Recent Labs  Lab 09/19/22 1337 09/23/22 0747 09/24/22 0402  WBC 5.1 6.2 6.5   Microbiology No results found for this or any previous visit (from the past 240 hour(s)).  Time coordinating discharge: 41 mins   SIGNED:  Standley Dakins, MD  [facility] 09/26/2022, 10:05 AM How to contact the [facility] Attending or Consulting provider 7A - 7P or covering provider during after hours 7P -7A, for this patient?  Check the care team in [facility] and look for a) attending/consulting [facility] provider listed and b) the [facility] team listed Log into [email or web address] and use [place]'s universal password to access. If you do not have the password, please contact the hospital operator. Locate the [facility] provider you are looking for under [facility] and page to a number that you can be directly reached. If you still have difficulty reaching the provider, please  page the [facility] (Director on Call) for the Hospitalists listed on amion for assistance.

## 2022-09-26 NOTE — Discharge Instructions (Addendum)
Home health Nurse to check PT/INR on 5/11, 5/13 and report results to primary care provider Pt to hold warfarin (DO not take warfarin) until INR is less than 3.   Please follow up with primary care provider in 5 days to have PT/INR checked and to discuss restarting warfarin   IMPORTANT INFORMATION: PAY CLOSE ATTENTION   PHYSICIAN DISCHARGE INSTRUCTIONS  Follow with Primary care provider  [name], MD  and other consultants as instructed by your Hospitalist Physician  SEEK MEDICAL CARE OR RETURN TO EMERGENCY ROOM IF SYMPTOMS COME BACK, WORSEN OR NEW PROBLEM DEVELOPS   Please note: You were cared for by a hospitalist during your hospital stay. Every effort will be made to forward records to your primary care provider.  You can request that your primary care provider send for your hospital records if they have not received them.  Once you are discharged, your primary care physician will handle any further medical issues. Please note that NO REFILLS for any discharge medications will be authorized once you are discharged, as it is imperative that you return to your primary care physician (or establish a relationship with a primary care physician if you do not have one) for your post hospital discharge needs so that they can reassess your need for medications and monitor your lab values.  Please get a complete blood count and chemistry panel checked by your Primary MD at your next visit, and again as instructed by your Primary MD.  Get Medicines reviewed and adjusted: Please take all your medications with you for your next visit with your Primary MD  Laboratory/radiological data: Please request your Primary MD to go over all hospital tests and procedure/radiological results at the follow up, please ask your primary care provider to get all Hospital records sent to his/her office.  In some cases, they will be blood work, cultures and biopsy results pending at the time of your discharge.  Please request that your primary care provider follow up on these results.  If you are diabetic, please bring your blood sugar readings with you to your follow up appointment with primary care.    Please call and make your follow up appointments as soon as possible.    Also Note the following: If you experience worsening of your admission symptoms, develop shortness of breath, life threatening emergency, suicidal or homicidal thoughts you must seek medical attention immediately by calling 911 or calling your MD immediately  if symptoms less severe.  You must read complete instructions/literature along with all the possible adverse reactions/side effects for all the Medicines you take and that have been prescribed to you. Take any new Medicines after you have completely understood and accpet all the possible adverse reactions/side effects.   Do not drive when taking Pain medications or sleeping medications (Benzodiazepines)  Do not take more than prescribed Pain, Sleep and Anxiety Medications. It is not advisable to combine anxiety,sleep and pain medications without talking with your primary care practitioner  Special Instructions: If you have smoked or chewed Tobacco  in the last 2 yrs please stop smoking, stop any regular Alcohol  and or any Recreational drug use.  Wear Seat belts while driving.  Do not drive if taking any narcotic, mind altering or controlled substances or recreational drugs or alcohol.

## 2022-09-26 NOTE — Care Management Important Message (Signed)
Important Message  Patient Details  Name: Cheryl Swanson MRN: [id number] Date of Birth: 21-Nov-1931   Medicare Important Message Given:  Yes     Corey Harold 09/26/2022, 10:47 AM

## 2022-09-26 NOTE — Progress Notes (Signed)
[facility]  Doing well. Eating and having Bms.  BP 117/75 (BP Location: Left Arm)   Pulse (!) 101   Temp 98.2 F (36.8 C)   Resp 17   Ht 5\' 6"  (1.676 m)   Wt 54.4 kg   SpO2 98%   BMI 19.36 kg/m  Soft, nondistended, nontender  Patient with resolving SBO. Doing well.  Home today.  Follow up with PCP  Algis Greenhouse, MD [facility] [address], [place] [address] [phone] (office)

## 2022-09-26 NOTE — Progress Notes (Signed)
Date and time results received: 09/26/22    Test: INR Critical Value: 4.4  Name of Provider Notified: Zierle-Ghosh  Orders Received? Or Actions Taken?: No new orders.

## 2022-09-26 NOTE — TOC Transition Note (Signed)
Transition of Care [facility]) - CM/SW Discharge Note   Patient Details  Name: Cheryl Swanson MRN: [id number] Date of Birth: 29-Jan-1932  Transition of Care [facility]) CM/SW Contact:  Elliot Gault, LCSW Phone Number: 09/26/2022, 10:44 AM   Clinical Narrative:     Pt admitted from home and MD has entered dc orders with [facility]. Spoke with pt to assess and assist with dc plans.  Pt reports that she is independent in ADLs at home and she plans to return home at dc. Pt states her daughter is here to pick her up. Discussed HHRN for lab draws and pt is agreeable. CMS provider options reviewed. Pt did not have any preference. She was agreeable to referral to [facility].  Morrie Sheldon at [facility] that they can start [facility] on Saturday as requested for lab draw.   Pt states that she does not have any other TOC needs for dc.  Expected Discharge Plan: Home w Home Health Services Barriers to Discharge: Barriers Resolved   Patient Goals and CMS Choice Patient states their goals for this hospitalization and ongoing recovery are:: return home CMS Medicare.gov Compare Post Acute Care list provided to:: Patient Choice offered to / list presented to : Patient  Expected Discharge Plan and Services Expected Discharge Plan: Home w Home Health Services In-house Referral: Clinical Social Work   Post Acute Care Choice: Home Health Living arrangements for the past 2 months: Single Family Home Expected Discharge Date: 09/26/22                         HH Arranged: RN HH Agency: [facility] Date HH Agency Contacted: 09/26/22   Representative spoke with at [facility] Agency: Morrie Sheldon  Prior Living Arrangements/Services Living arrangements for the past 2 months: Single Family Home Lives with:: Self Patient language and need for interpreter reviewed:: Yes Do you feel safe going back to the place where you live?: Yes      Need for Family Participation in Patient Care: No (Comment)     Criminal  Activity/Legal Involvement Pertinent to Current Situation/Hospitalization: No - Comment as needed  Activities of Daily Living Home Assistive Devices/Equipment: Cane (specify quad or straight), Walker (specify type), Shower chair with back, Other (Comment) (lift chair) ADL Screening (condition at time of admission) Patient's cognitive ability adequate to safely complete daily activities?: Yes Is the patient deaf or have difficulty hearing?: No Does the patient have difficulty seeing, even when wearing glasses/contacts?: No Does the patient have difficulty concentrating, remembering, or making decisions?: No Patient able to express need for assistance with ADLs?: Yes Does the patient have difficulty dressing or bathing?: No Independently performs ADLs?: Yes (appropriate for developmental age) Does the patient have difficulty walking or climbing stairs?: Yes Weakness of Legs: Both Weakness of Arms/Hands: None  Permission Sought/Granted Permission sought to share information with : Facility Industrial/product designer granted to share information with : Yes, Verbal Permission Granted     Permission granted to share info w AGENCY: HH        Emotional Assessment Appearance:: Appears younger than stated age Attitude/Demeanor/Rapport: Engaged Affect (typically observed): Pleasant Orientation: : Oriented to Self, Oriented to Place, Oriented to  Time, Oriented to Situation Alcohol / Substance Use: Not Applicable Psych Involvement: No (comment)  Admission diagnosis:  Small bowel obstruction (HCC) [K56.609] Patient Active Problem List   Diagnosis Date Noted   Small bowel obstruction (HCC) 09/23/2022   Supratherapeutic INR 09/23/2022   Pulmonary embolism (  HCC) 08/16/2019   Fatigue 10/29/2012   Rheumatoid arthritis (HCC) 10/29/2012   PCP:  [name], MD Pharmacy:   [facility] [place],  - [address] [place] [address] Phone:  [phone] Fax: [phone]     Social Determinants of Health (SDOH) Interventions    Readmission Risk Interventions     No data to display           Final next level of care: Home w Home Health Services Barriers to Discharge: Barriers Resolved   Patient Goals and CMS Choice CMS Medicare.gov Compare Post Acute Care list provided to:: Patient Choice offered to / list presented to : Patient  Discharge Placement                         Discharge Plan and Services Additional resources added to the After Visit Summary for   In-house Referral: Clinical Social Work   Post Acute Care Choice: Home Health                    HH Arranged: RN [facility] Agency: [facility] Date [facility] Agency Contacted: 09/26/22   Representative spoke with at [facility] Agency: Morrie Sheldon  Social Determinants of Health (SDOH) Interventions SDOH Screenings   Food Insecurity: No Food Insecurity (09/23/2022)  Housing: Low Risk  (09/23/2022)  Transportation Needs: No Transportation Needs (09/23/2022)  Utilities: Not At Risk (09/23/2022)  Tobacco Use: Low Risk  (09/23/2022)     Readmission Risk Interventions     No data to display

## 2022-11-04 ENCOUNTER — Other Ambulatory Visit (HOSPITAL_COMMUNITY)
Admission: RE | Admit: 2022-11-04 | Discharge: 2022-11-04 | Disposition: A | Discharge status: Home / Self Care | Payer: Medicare Other | Source: Ambulatory Visit | Attending: Family Medicine | Admitting: Family Medicine

## 2022-11-04 DIAGNOSIS — Z7901 Long term (current) use of anticoagulants: Secondary | ICD-10-CM | POA: Diagnosis present

## 2022-11-04 LAB — PROTIME-INR
INR: 3 — ABNORMAL HIGH (ref 0.8–1.2)
Prothrombin Time: 31.3 seconds — ABNORMAL HIGH (ref 11.4–15.2)

## 2022-12-03 ENCOUNTER — Ambulatory Visit (INDEPENDENT_AMBULATORY_CARE_PROVIDER_SITE_OTHER): Payer: Medicare Other | Admitting: Internal Medicine

## 2022-12-03 ENCOUNTER — Encounter: Payer: Self-pay | Admitting: Internal Medicine

## 2022-12-03 VITALS — BP 146/75 | HR 74 | Ht 66.0 in | Wt 106.2 lb

## 2022-12-03 DIAGNOSIS — E785 Hyperlipidemia, unspecified: Secondary | ICD-10-CM | POA: Insufficient documentation

## 2022-12-03 DIAGNOSIS — Z7901 Long term (current) use of anticoagulants: Secondary | ICD-10-CM | POA: Diagnosis not present

## 2022-12-03 DIAGNOSIS — Z86711 Personal history of pulmonary embolism: Secondary | ICD-10-CM | POA: Diagnosis not present

## 2022-12-03 DIAGNOSIS — H409 Unspecified glaucoma: Secondary | ICD-10-CM | POA: Insufficient documentation

## 2022-12-03 DIAGNOSIS — Z8709 Personal history of other diseases of the respiratory system: Secondary | ICD-10-CM | POA: Insufficient documentation

## 2022-12-03 DIAGNOSIS — M057A Rheumatoid arthritis with rheumatoid factor of other specified site without organ or systems involvement: Secondary | ICD-10-CM

## 2022-12-03 DIAGNOSIS — I2694 Multiple subsegmental pulmonary emboli without acute cor pulmonale: Secondary | ICD-10-CM | POA: Diagnosis not present

## 2022-12-03 DIAGNOSIS — E782 Mixed hyperlipidemia: Secondary | ICD-10-CM

## 2022-12-03 NOTE — Progress Notes (Signed)
New Patient Office Visit  Subjective    Patient ID: Cheryl Swanson, female    DOB: 1931-06-22  Age: 89 y.o. MRN: [id number]  CC:  Chief Complaint  Patient presents with   Establish Care   HPI Cheryl Swanson presents to establish care.  She is an 89 year old woman who endorses a past medical history significant for rheumatoid arthritis, HLD, unprovoked PE/DVT, glaucoma, and COPD. Previously followed by Dr. Sudie Bailey.  Cheryl Swanson reports feeling fairly well today.  She endorses chronic bilateral knee pain but is otherwise asymptomatic and has no acute concerns to discuss today.  She is accompanied by her daughter today.  Cheryl Swanson retired from working at [employer].  She denies a history of tobacco, alcohol, and illicit drug use.  Her family medical history is significant for HTN, DM, colon cancer, and pancreatic cancer.  Chronic medical conditions and outstanding preventative care items discussed today are individually addressed in A/P below.  Outpatient Encounter Medications as of 12/03/2022  Medication Sig   calcium-vitamin D (OSCAL WITH D) 500-200 MG-UNIT per tablet Take 1 tablet by mouth 2 (two) times daily.   carboxymethylcellulose (REFRESH PLUS) 0.5 % SOLN Place 1 drop into both eyes 2 (two) times daily as needed (dry eyes).   dorzolamide-timolol (COSOPT) 2-0.5 % ophthalmic solution Place 1 drop into the right eye 2 (two) times daily.   folic acid (FOLVITE) 1 MG tablet 1 tablet Orally Once a day for 90 days   furosemide (LASIX) 40 MG tablet Take 1 tablet (40 mg total) by mouth daily.   HYDROcodone-acetaminophen (NORCO/VICODIN) 5-325 MG tablet Take 2 tablets by mouth every 6 (six) hours as needed for moderate pain.   latanoprost (XALATAN) 0.005 % ophthalmic solution Place 1 drop into both eyes at bedtime.    methotrexate (RHEUMATREX) 2.5 MG tablet Take 4 tabs once a week for 1 month, get labs, if ok, then increase to 6 tabs once a week for 1 month, get labs, if ok, then  increase to 8 tabs once a week. Orally weekly for 90 days   potassium chloride SA (KLOR-CON M) 20 MEQ tablet Take 20 mEq by mouth daily.   warfarin (COUMADIN) 5 MG tablet 1 tablet Orally Once a day   [DISCONTINUED] atorvastatin (LIPITOR) 20 MG tablet Take 20 mg by mouth daily.   B Complex Vitamins (VITAMIN B COMPLEX) TABS as directed Orally   Cholecalciferol (VITAMIN D3) 50 MCG (2000 UT) capsule 1 capsule Orally Once a day   [DISCONTINUED] albuterol (PROVENTIL HFA;VENTOLIN HFA) 108 (90 Base) MCG/ACT inhaler Inhale 2 puffs into the lungs every 4 (four) hours as needed for wheezing or shortness of breath. (Patient not taking: Reported on 12/03/2022)   [DISCONTINUED] LORazepam (ATIVAN) 0.5 MG tablet Take 0.5-1 mg by mouth at bedtime. (Patient not taking: Reported on 12/03/2022)   [DISCONTINUED] simvastatin (ZOCOR) 20 MG tablet Take 20 mg by mouth every evening.  (Patient not taking: Reported on 12/03/2022)   No facility-administered encounter medications on file as of 12/03/2022.    Past Medical History:  Diagnosis Date   Arthritis    COPD (chronic obstructive pulmonary disease) (HCC)    Glaucoma    Mitral valve disorder    Peptic ulcer    Rheumatoid arthritis(714.0)    Tricuspid valve disorder     Past Surgical History:  Procedure Laterality Date   ABDOMINAL HYSTERECTOMY  1971   HAND SURGERY  1980's   left    Family History  Problem Relation Age  of Onset   Cancer Mother    Cancer Father    Cancer Brother    Cancer Brother    Cancer Son        prostate-remission    Social History   Socioeconomic History   Marital status: Widowed    Spouse name: Not on file   Number of children: Not on file   Years of education: Not on file   Highest education level: Not on file  Occupational History   Not on file  Tobacco Use   Smoking status: Never   Smokeless tobacco: Never  Vaping Use   Vaping status: Never Used  Substance and Sexual Activity   Alcohol use: No   Drug use: No    Sexual activity: Not on file  Other Topics Concern   Not on file  Social History Narrative   Not on file   Social Determinants of Health   Financial Resource Strain: Not on file  Food Insecurity: No Food Insecurity (09/23/2022)   Hunger Vital Sign    Worried About Running Out of Food in the Last Year: Never true    Ran Out of Food in the Last Year: Never true  Transportation Needs: No Transportation Needs (09/23/2022)   PRAPARE - Administrator, Civil Service (Medical): No    Lack of Transportation (Non-Medical): No  Physical Activity: Not on file  Stress: Not on file  Social Connections: Not on file  Intimate Partner Violence: Not At Risk (09/23/2022)   Humiliation, Afraid, Rape, and Kick questionnaire    Fear of Current or Ex-Partner: No    Emotionally Abused: No    Physically Abused: No    Sexually Abused: No   Review of Systems  Constitutional:  Negative for chills and fever.  HENT:  Negative for sore throat.   Respiratory:  Negative for cough and shortness of breath.   Cardiovascular:  Negative for chest pain, palpitations and leg swelling.  Gastrointestinal:  Negative for abdominal pain, blood in stool, constipation, diarrhea, nausea and vomiting.  Genitourinary:  Negative for dysuria and hematuria.  Musculoskeletal:  Positive for joint pain (Chronic bilateral knee pain). Negative for myalgias.  Skin:  Negative for itching and rash.  Neurological:  Negative for dizziness and headaches.  Psychiatric/Behavioral:  Negative for depression and suicidal ideas.   All other systems reviewed and are negative.  Objective    BP (!) 146/75   Pulse 74   Ht 5\' 6"  (1.676 m)   Wt 106 lb 3.2 oz (48.2 kg)   SpO2 97%   BMI 17.14 kg/m   Physical Exam Vitals reviewed.  Constitutional:      General: She is not in acute distress.    Appearance: Normal appearance. She is not toxic-appearing.  HENT:     Head: Normocephalic and atraumatic.     Right Ear: External ear normal.      Left Ear: External ear normal.     Nose: Nose normal. No congestion or rhinorrhea.     Mouth/Throat:     Mouth: Mucous membranes are moist.     Pharynx: Oropharynx is clear. No oropharyngeal exudate or posterior oropharyngeal erythema.  Eyes:     General: No scleral icterus.    Extraocular Movements: Extraocular movements intact.     Conjunctiva/sclera: Conjunctivae normal.     Pupils: Pupils are equal, round, and reactive to light.  Cardiovascular:     Rate and Rhythm: Normal rate and regular rhythm.     Pulses:  Normal pulses.     Heart sounds: Normal heart sounds. No murmur heard.    No friction rub. No gallop.  Pulmonary:     Effort: Pulmonary effort is normal.     Breath sounds: Normal breath sounds. No wheezing, rhonchi or rales.  Abdominal:     General: Abdomen is flat. Bowel sounds are normal. There is no distension.     Palpations: Abdomen is soft.     Tenderness: There is no abdominal tenderness.  Musculoskeletal:        General: Deformity (Ulnar deviation of both hands as well as nodular deformities along the MCPs of each hand) present. No swelling. Normal range of motion.     Cervical back: Normal range of motion.     Right lower leg: No edema.     Left lower leg: No edema.  Lymphadenopathy:     Cervical: No cervical adenopathy.  Skin:    General: Skin is warm and dry.     Capillary Refill: Capillary refill takes less than 2 seconds.     Coloration: Skin is not jaundiced.  Neurological:     General: No focal deficit present.     Mental Status: She is alert and oriented to person, place, and time.  Psychiatric:        Mood and Affect: Mood normal.        Behavior: Behavior normal.    Assessment & Plan:   Problem List Items Addressed This Visit       Pulmonary embolism (HCC)    History of unprovoked recurrent DVT/PE.  She is currently prescribed warfarin 2.5 mg daily.  This was previously managed by her PCP.  INR last checked on 6/17.  She has had recent  issues with supratherapeutic INR. -Cardiology referral placed today for warfarin management      Rheumatoid arthritis (HCC)    History of RA.  She is currently prescribed methotrexate 2.5 mg weekly.  This has previously been managed by her PCP, however she has recently established care with rheumatology.  She is additionally prescribed Norco 10-325 mg 4 times daily as needed for pain relief, however she reports only taking Norco twice daily.  She is also prescribed prednisone 10 mg daily as needed. -PDMP reviewed and is appropriate -Further management per rheumatology      Hyperlipidemia    History of hyperlipidemia.  She is currently prescribed atorvastatin 20 mg daily.  Lipid panel last updated in October 2022.  No prior history of CVA or known CAD. -No indication to continue statin therapy in the absence of known cardiovascular disease based on her age.  No prior history of stroke/MI.  Through shared decision-making, atorvastatin has been discontinued today.      Glaucoma    Followed by ophthalmology.  She is currently prescribed Cosopt and latanoprost ophthalmic drops.      History of COPD    No PFTs on file for review.  Pulmonary exam is unremarkable.  She is asymptomatic currently.  She is prescribed albuterol but has never needed to use it.  Patient states she was told many years ago she had COPD.  No history of tobacco use.      Return in about 3 months (around 03/05/2023).   Billie Lade, MD

## 2022-12-03 NOTE — Assessment & Plan Note (Signed)
History of unprovoked recurrent DVT/PE.  She is currently prescribed warfarin 2.5 mg daily.  This was previously managed by her PCP.  INR last checked on 6/17.  She has had recent issues with supratherapeutic INR. -Cardiology referral placed today for warfarin management

## 2022-12-03 NOTE — Assessment & Plan Note (Signed)
Followed by ophthalmology.  She is currently prescribed Cosopt and latanoprost ophthalmic drops.

## 2022-12-03 NOTE — Assessment & Plan Note (Signed)
History of RA.  She is currently prescribed methotrexate 2.5 mg weekly.  This has previously been managed by her PCP, however she has recently established care with rheumatology.  She is additionally prescribed Norco 10-325 mg 4 times daily as needed for pain relief, however she reports only taking Norco twice daily.  She is also prescribed prednisone 10 mg daily as needed. -PDMP reviewed and is appropriate -Further management per rheumatology

## 2022-12-03 NOTE — Patient Instructions (Signed)
It was a pleasure to see you today.  Thank you for giving [place] the opportunity to be involved in your care.  Below is a brief recap of your visit and next steps.  We will plan to see you again in 3 months.  Summary You have established care today I have placed a referral to cardiology for warfarin management I do not see a reason why you need to continue medication for your cholesterol Follow up in 3 months

## 2022-12-03 NOTE — Assessment & Plan Note (Signed)
History of hyperlipidemia.  She is currently prescribed atorvastatin 20 mg daily.  Lipid panel last updated in October 2022.  No prior history of CVA or known CAD. -No indication to continue statin therapy in the absence of known cardiovascular disease based on her age.  No prior history of stroke/MI.  Through shared decision-making, atorvastatin has been discontinued today.

## 2022-12-03 NOTE — Assessment & Plan Note (Signed)
No PFTs on file for review.  Pulmonary exam is unremarkable.  She is asymptomatic currently.  She is prescribed albuterol but has never needed to use it.  Patient states she was told many years ago she had COPD.  No history of tobacco use.

## 2022-12-09 ENCOUNTER — Other Ambulatory Visit (HOSPITAL_COMMUNITY)
Admission: RE | Admit: 2022-12-09 | Discharge: 2022-12-09 | Disposition: A | Discharge status: Home / Self Care | Payer: Medicare Other | Source: Ambulatory Visit | Attending: Family Medicine | Admitting: Family Medicine

## 2022-12-09 DIAGNOSIS — Z7901 Long term (current) use of anticoagulants: Secondary | ICD-10-CM | POA: Diagnosis present

## 2022-12-09 LAB — PROTIME-INR
INR: 2.7 — ABNORMAL HIGH (ref 0.8–1.2)
Prothrombin Time: 28.6 seconds — ABNORMAL HIGH (ref 11.4–15.2)

## 2023-01-23 ENCOUNTER — Encounter: Payer: Self-pay | Admitting: Internal Medicine

## 2023-01-23 ENCOUNTER — Telehealth: Payer: Self-pay | Admitting: Internal Medicine

## 2023-01-23 ENCOUNTER — Ambulatory Visit: Payer: Medicare Other | Attending: Internal Medicine | Admitting: Internal Medicine

## 2023-01-23 VITALS — BP 134/60 | HR 86 | Ht 66.5 in | Wt 113.0 lb

## 2023-01-23 DIAGNOSIS — R791 Abnormal coagulation profile: Secondary | ICD-10-CM | POA: Diagnosis present

## 2023-01-23 NOTE — Telephone Encounter (Signed)
Patient states she is returning a call. I scheduled INR on 9/11 in [place]. Please advise if patient needs to be seen within a different time frame.

## 2023-01-23 NOTE — Progress Notes (Addendum)
Cardiology Office Note   Date:  01/23/2023   ID:  Cheryl, Swanson [birth year], MRN [id number]  PCP:  Billie Lade, MD  Cardiologist:   Dietrich Pates, MD   Pt presents to establish for coumadin monitoring   History of Present Illness: Cheryl Swanson is a 89 y.o. female with a history of HL, RA, PE/DVT(unprovoked in 2021), atherosclerosis, COPD, glaucoma     She was previous followed by Dr Sudie Bailey, now by Nancy Marus   Needs to have follow up for coumadin Rx    The pt says her breathing is OK   She denies CP  No dizziness  No palpitations   Troubled by RA, especially in knees   Current Meds  Medication Sig   B Complex Vitamins (VITAMIN B COMPLEX) TABS as directed Orally   calcium-vitamin D (OSCAL WITH D) 500-200 MG-UNIT per tablet Take 1 tablet by mouth 2 (two) times daily.   carboxymethylcellulose (REFRESH PLUS) 0.5 % SOLN Place 1 drop into both eyes 2 (two) times daily as needed (dry eyes).   Cholecalciferol (VITAMIN D3) 50 MCG (2000 UT) capsule 1 capsule Orally Once a day   dorzolamide-timolol (COSOPT) 2-0.5 % ophthalmic solution Place 1 drop into the right eye 2 (two) times daily.   folic acid (FOLVITE) 1 MG tablet 1 tablet Orally Once a day for 90 days   furosemide (LASIX) 40 MG tablet Take 1 tablet (40 mg total) by mouth daily.   HYDROcodone-acetaminophen (NORCO/VICODIN) 5-325 MG tablet Take 2 tablets by mouth every 6 (six) hours as needed for moderate pain.   latanoprost (XALATAN) 0.005 % ophthalmic solution Place 1 drop into both eyes at bedtime.    methotrexate (RHEUMATREX) 2.5 MG tablet Take 4 tabs once a week for 1 month, get labs, if ok, then increase to 6 tabs once a week for 1 month, get labs, if ok, then increase to 8 tabs once a week. Orally weekly for 90 days   potassium chloride SA (KLOR-CON M) 20 MEQ tablet Take 20 mEq by mouth daily.   warfarin (COUMADIN) 5 MG tablet Take 2.5 mg by mouth daily.     Allergies:   Patient has no known allergies.   Past Medical  History:  Diagnosis Date   Arthritis    COPD (chronic obstructive pulmonary disease) (HCC)    Glaucoma    Mitral valve disorder    Peptic ulcer    Rheumatoid arthritis(714.0)    Tricuspid valve disorder     Past Surgical History:  Procedure Laterality Date   ABDOMINAL HYSTERECTOMY  1971   HAND SURGERY  1980's   left     Social History:  The patient  reports that she has never smoked. She has never used smokeless tobacco. She reports that she does not drink alcohol and does not use drugs.   Family History:  The patient's family history includes Cancer in her brother, brother, father, mother, and son.    ROS:  Please see the history of present illness. All other systems are reviewed and  Negative to the above problem except as noted.    PHYSICAL EXAM: VS:  BP 134/60 (BP Location: Right Arm, Patient Position: Sitting, Cuff Size: Normal)   Pulse 86   Ht 5' 6.5" (1.689 m)   Wt 113 lb (51.3 kg)   SpO2 98%   BMI 17.97 kg/m   GEN: Well nourished, well developed, in no acute distress   Examined in wheelchair HEENT: normal  Neck: no  JVD, carotid bruits Cardiac: RRR; no murmurs  Tr LE edema  Respiratory:  clear to auscultation  GI: soft, nontender,  No hepatomegaly     EKG:  EKG is not ordered today.  Echo 2024   1. Left ventricular ejection fraction, by estimation, is 55%. The left  ventricle has normal function. The left ventricle has no regional wall  motion abnormalities. Left ventricular diastolic parameters are  indeterminate. There is abnormal septal motion.   2. Right ventricular systolic function is normal. The right ventricular  size is normal. There is mildly elevated pulmonary artery systolic  pressure. The estimated right ventricular systolic pressure is 37.3 mmHg.   3. Mild postinflammatory appearance to mitral valve opening. Submitral  chordal calcifications. The mitral valve is abnormal. Mild mitral valve  regurgitation. Mild mitral stenosis. The mean  mitral valve gradient is 5.0  mmHg with average heart rate of 71  bpm.   4. The aortic valve is grossly normal. There is mild calcification of the  aortic valve. Aortic valve regurgitation is trivial.   5. The inferior vena cava is dilated in size with <50% respiratory  variability, suggesting right atrial pressure of 15 mmHg.     Lipid Panel    Component Value Date/Time   CHOL 194 02/26/2021 1412   TRIG 61 02/26/2021 1412   HDL 72 02/26/2021 1412   CHOLHDL 2.7 02/26/2021 1412   VLDL 12 02/26/2021 1412   LDLCALC 110 (H) 02/26/2021 1412      Wt Readings from Last 3 Encounters:  01/23/23 113 lb (51.3 kg)  12/03/22 106 lb 3.2 oz (48.2 kg)  09/23/22 119 lb 14.9 oz (54.4 kg)      ASSESSMENT AND PLAN:  1Hx of DVT/PE   Unprovoked in past   Continue on coumadin  Will get today  Establish with [name]  Should get labs at [facility]  2  Atherosclerosis   Atherosclerosis of aorta noted on CT   Control risk factors   3  HL  Pt was on simvistatin  This was stopped   will review records   LDL when last checked was over 110    4 HTN  BP 134/60  Adequate for age    34  Mitral stenosis   Reviewed echo. MS is very mild   Follow   Current medicines are reviewed at length with the patient today.  The patient does not have concerns regarding medicines.  Signed, Dietrich Pates, MD  01/23/2023 4:09 PM    [facility] [address] [place], [place], [place]  [address] Phone: [phone]; Fax: [phone]

## 2023-01-23 NOTE — Patient Instructions (Signed)
Medication Instructions:  Your physician recommends that you continue on your current medications as directed. Please refer to the Current Medication list given to you today.  *If you need a refill on your cardiac medications before your next appointment, please call your pharmacy*   Lab Work: Your physician recommends that you return for lab work in: Today   If you have labs (blood work) drawn today and your tests are completely normal, you will receive your results only by: MyChart Message (if you have MyChart) OR A paper copy in the mail If you have any lab test that is abnormal or we need to change your treatment, we will call you to review the results.   Testing/Procedures: NONE    Follow-Up: At [facility], you and your health needs are our priority.  As part of our continuing mission to provide you with exceptional heart care, we have created designated Provider Care Teams.  These Care Teams include your primary Cardiologist (physician) and Advanced Practice Providers (APPs -  Physician Assistants and Nurse Practitioners) who all work together to provide you with the care you need, when you need it.  We recommend signing up for the patient portal called "MyChart".  Sign up information is provided on this After Visit Summary.  MyChart is used to connect with patients for Virtual Visits (Telemedicine).  Patients are able to view lab/test results, encounter notes, upcoming appointments, etc.  Non-urgent messages can be sent to your provider as well.   To learn more about what you can do with MyChart, go to [email or web address].    Your next appointment:   1 year(s)  Provider:   You may see Dietrich Pates, MD or one of the following Advanced Practice Providers on your designated Care Team:   Randall An, PA-C  Jacolyn Reedy, PA-C     Other Instructions Thank you for choosing [facility]!

## 2023-01-24 ENCOUNTER — Telehealth: Payer: Self-pay

## 2023-01-24 LAB — PROTIME-INR
INR: 2 — ABNORMAL HIGH (ref 0.9–1.2)
Prothrombin Time: 20.9 s — ABNORMAL HIGH (ref 9.1–12.0)

## 2023-01-24 NOTE — Telephone Encounter (Signed)
Received anticoagulation enrollment for pt. Called pt to make her aware she has a Coumadin Clinic appt on 01/29/23 to have INR checked at our [facility] location. Pt verbalized understating and states she has been on Coumadin for years. Her current dose is Warfarin 2.5mg  daily (1/2 5mg  tablet). Pt previously went to [facility] for INR monitoring; however, it has not be within range and is now going to be monitored by [facility]. Pt appreciated the call and states she will be at Coumadin Clinic on 9/11 in [place].

## 2023-01-24 NOTE — Telephone Encounter (Signed)
Time frame acceptable for INR.

## 2023-01-27 ENCOUNTER — Other Ambulatory Visit: Payer: Self-pay

## 2023-01-27 DIAGNOSIS — I2694 Multiple subsegmental pulmonary emboli without acute cor pulmonale: Secondary | ICD-10-CM

## 2023-01-27 DIAGNOSIS — R791 Abnormal coagulation profile: Secondary | ICD-10-CM

## 2023-01-28 ENCOUNTER — Ambulatory Visit (INDEPENDENT_AMBULATORY_CARE_PROVIDER_SITE_OTHER): Payer: Medicare Other

## 2023-01-28 VITALS — Ht 66.5 in | Wt 111.0 lb

## 2023-01-28 DIAGNOSIS — Z789 Other specified health status: Secondary | ICD-10-CM | POA: Diagnosis not present

## 2023-01-28 DIAGNOSIS — Z741 Need for assistance with personal care: Secondary | ICD-10-CM

## 2023-01-28 DIAGNOSIS — Z0000 Encounter for general adult medical examination without abnormal findings: Secondary | ICD-10-CM | POA: Diagnosis not present

## 2023-01-28 NOTE — Patient Instructions (Addendum)
Cheryl Swanson , Thank you for taking time to come for your Medicare Wellness Visit. I appreciate your ongoing commitment to your health goals. Please review the following plan we discussed and let me know if I can assist you in the future.   Referrals/Orders/Follow-Ups/Clinician Recommendations:  A referral has been placed for you to see if there are any additional resources to help you with one of the following:     []   Transportation Needs   []   Utility Needs   []   Food Insecurity   [x]  Assistance with daily activities such as bath, dressing, and managing your medications   []  Housing Insecurity If you haven't heard from anyone within the next 7 business days, please call them and let them know a referral has been placed  Concierge Line: [phone]   This is a list of the screening recommended for you and due dates:  Health Maintenance  Topic Date Due   Zoster (Shingles) Vaccine (1 of 2) Never done   Pneumonia Vaccine (2 of 2 - PCV) 02/18/2013   DTaP/Tdap/Td vaccine (2 - Tdap) 10/23/2016   Flu Shot  12/19/2022   COVID-19 Vaccine (4 - 2023-24 season) 01/19/2023   Medicare Annual Wellness Visit  01/28/2024   DEXA scan (bone density measurement)  Completed   HPV Vaccine  Aged Out    Advanced directives: (In Chart) A copy of your advanced directives are scanned into your chart should your provider ever need it.  Next Medicare Annual Wellness Visit scheduled for next year: Yes Novemer 12, 2025 at 8:30 am telephone visit  Preventive Care 65 Years and Older, Female Preventive care refers to lifestyle choices and visits with your health care provider that can promote health and wellness. Preventive care visits are also called wellness exams. What can I expect for my preventive care visit? Counseling Your health care provider may ask you questions about your: Medical history, including: Past medical problems. Family medical history. Pregnancy and menstrual history. History of  falls. Current health, including: Memory and ability to understand (cognition). Emotional well-being. Home life and relationship well-being. Sexual activity and sexual health. Lifestyle, including: Alcohol, nicotine or tobacco, and drug use. Access to firearms. Diet, exercise, and sleep habits. Work and work Astronomer. Sunscreen use. Safety issues such as seatbelt and bike helmet use. Physical exam Your health care provider will check your: Height and weight. These may be used to calculate your BMI (body mass index). BMI is a measurement that tells if you are at a healthy weight. Waist circumference. This measures the distance around your waistline. This measurement also tells if you are at a healthy weight and may help predict your risk of certain diseases, such as type 2 diabetes and high blood pressure. Heart rate and blood pressure. Body temperature. Skin for abnormal spots. What immunizations do I need?  Vaccines are usually given at various ages, according to a schedule. Your health care provider will recommend vaccines for you based on your age, medical history, and lifestyle or other factors, such as travel or where you work. What tests do I need? Screening Your health care provider may recommend screening tests for certain conditions. This may include: Lipid and cholesterol levels. Hepatitis C test. Hepatitis B test. HIV (human immunodeficiency virus) test. STI (sexually transmitted infection) testing, if you are at risk. Lung cancer screening. Colorectal cancer screening. Diabetes screening. This is done by checking your blood sugar (glucose) after you have not eaten for a while (fasting). Mammogram. Talk with your  health care provider about how often you should have regular mammograms. BRCA-related cancer screening. This may be done if you have a family history of breast, ovarian, tubal, or peritoneal cancers. Bone density scan. This is done to screen for  osteoporosis. Talk with your health care provider about your test results, treatment options, and if necessary, the need for more tests. Follow these instructions at home: Eating and drinking  Eat a diet that includes fresh fruits and vegetables, whole grains, lean protein, and low-fat dairy products. Limit your intake of foods with high amounts of sugar, saturated fats, and salt. Take vitamin and mineral supplements as recommended by your health care provider. Do not drink alcohol if your health care provider tells you not to drink. If you drink alcohol: Limit how much you have to 0-1 drink a day. Know how much alcohol is in your drink. In the [place] one drink equals one 12 oz bottle of beer (355 mL), one 5 oz glass of wine (148 mL), or one 1 oz glass of hard liquor (44 mL). Lifestyle Brush your teeth every morning and night with fluoride toothpaste. Floss one time each day. Exercise for at least 30 minutes 5 or more days each week. Do not use any products that contain nicotine or tobacco. These products include cigarettes, chewing tobacco, and vaping devices, such as e-cigarettes. If you need help quitting, ask your health care provider. Do not use drugs. If you are sexually active, practice safe sex. Use a condom or other form of protection in order to prevent STIs. Take aspirin only as told by your health care provider. Make sure that you understand how much to take and what form to take. Work with your health care provider to find out whether it is safe and beneficial for you to take aspirin daily. Ask your health care provider if you need to take a cholesterol-lowering medicine (statin). Find healthy ways to manage stress, such as: Meditation, yoga, or listening to music. Journaling. Talking to a trusted person. Spending time with friends and family. Minimize exposure to UV radiation to reduce your risk of skin cancer. Safety Always wear your seat belt while driving or riding in a  vehicle. Do not drive: If you have been drinking alcohol. Do not ride with someone who has been drinking. When you are tired or distracted. While texting. If you have been using any mind-altering substances or drugs. Wear a helmet and other protective equipment during sports activities. If you have firearms in your house, make sure you follow all gun safety procedures. What's next? Visit your health care provider once a year for an annual wellness visit. Ask your health care provider how often you should have your eyes and teeth checked. Stay up to date on all vaccines. This information is not intended to replace advice given to you by your health care provider. Make sure you discuss any questions you have with your health care provider. Document Revised: 11/01/2020 Document Reviewed: 11/01/2020 Elsevier Patient Education  2024 [employer]. Understanding Your Risk for Falls Millions of people have serious injuries from falls each year. It is important to understand your risk of falling. Talk with your health care provider about your risk and what you can do to lower it. If you do have a serious fall, make sure to tell your provider. Falling once raises your risk of falling again. How can falls affect me? Serious injuries from falls are common. These include: Broken bones, such as hip fractures. Head  injuries, such as traumatic brain injuries (TBI) or concussions. A fear of falling can cause you to avoid activities and stay at home. This can make your muscles weaker and raise your risk for a fall. What can increase my risk? There are a number of risk factors that increase your risk for falling. The more risk factors you have, the higher your risk of falling. Serious injuries from a fall happen most often to people who are older than 87 years old. Teenagers and young adults ages 97-29 are also at higher risk. Common risk factors include: Weakness in the lower body. Being generally weak or  confused due to long-term (chronic) illness. Dizziness or balance problems. Poor vision. Medicines that cause dizziness or drowsiness. These may include: Medicines for your blood pressure, heart, anxiety, insomnia, or swelling (edema). Pain medicines. Muscle relaxants. Other risk factors include: Drinking alcohol. Having had a fall in the past. Having foot pain or wearing improper footwear. Working at a dangerous job. Having any of the following in your home: Tripping hazards, such as floor clutter or loose rugs. Poor lighting. Pets. Having dementia or memory loss. What actions can I take to lower my risk of falling?     Physical activity Stay physically fit. Do strength and balance exercises. Consider taking a regular class to build strength and balance. Yoga and tai chi are good options. Vision Have your eyes checked every year and your prescription for glasses or contacts updated as needed. Shoes and walking aids Wear non-skid shoes. Wear shoes that have rubber soles and low heels. Do not wear high heels. Do not walk around the house in socks or slippers. Use a cane or walker as told by your provider. Home safety Attach secure railings on both sides of your stairs. Install grab bars for your bathtub, shower, and toilet. Use a non-skid mat in your bathtub or shower. Attach bath mats securely with double-sided, non-slip rug tape. Use good lighting in all rooms. Keep a flashlight near your bed. Make sure there is a clear path from your bed to the bathroom. Use night-lights. Do not use throw rugs. Make sure all carpeting is taped or tacked down securely. Remove all clutter from walkways and stairways, including extension cords. Repair uneven or broken steps and floors. Avoid walking on icy or slippery surfaces. Walk on the grass instead of on icy or slick sidewalks. Use ice melter to get rid of ice on walkways in the winter. Use a cordless phone. Questions to ask your health  care provider Can you help me check my risk for a fall? Do any of my medicines make me more likely to fall? Should I take a vitamin D supplement? What exercises can I do to improve my strength and balance? Should I make an appointment to have my vision checked? Do I need a bone density test to check for weak bones (osteoporosis)? Would it help to use a cane or a walker? Where to find more information Centers for Disease Control and Prevention, [employer]: TonerPromos.no Community-Based Fall Prevention Programs: TonerPromos.no [employer] on Aging: BaseRingTones.pl Contact a health care provider if: You fall at home. You are afraid of falling at home. You feel weak, drowsy, or dizzy. This information is not intended to replace advice given to you by your health care provider. Make sure you discuss any questions you have with your health care provider. Document Revised: 01/07/2022 Document Reviewed: 01/07/2022 Elsevier Patient Education  2024 [employer]. Managing Pain Without Opioids Opioids are  strong medicines used to treat moderate to severe pain. For some people, especially those who have long-term (chronic) pain, opioids may not be the best choice for pain management due to: Side effects like nausea, constipation, and sleepiness. The risk of addiction (opioid use disorder). The longer you take opioids, the greater your risk of addiction. Pain that lasts for more than 3 months is called chronic pain. Managing chronic pain usually requires more than one approach and is often provided by a team of health care providers working together (multidisciplinary approach). Pain management may be done at a pain management center or pain clinic. How to manage pain without the use of opioids Use non-opioid medicines Non-opioid medicines for pain may include: Over-the-counter or prescription non-steroidal anti-inflammatory drugs (NSAIDs). These may be the first medicines used for pain. They work well for muscle  and bone pain, and they reduce swelling. Acetaminophen. This over-the-counter medicine may work well for milder pain but not swelling. Antidepressants. These may be used to treat chronic pain. A certain type of antidepressant (tricyclics) is often used. These medicines are given in lower doses for pain than when used for depression. Anticonvulsants. These are usually used to treat seizures but may also reduce nerve (neuropathic) pain. Muscle relaxants. These relieve pain caused by sudden muscle tightening (spasms). You may also use a pain medicine that is applied to the skin as a patch, cream, or gel (topical analgesic), such as a numbing medicine. These may cause fewer side effects than medicines taken by mouth. Do certain therapies as directed Some therapies can help with pain management. They include: Physical therapy. You will do exercises to gain strength and flexibility. A physical therapist may teach you exercises to move and stretch parts of your body that are weak, stiff, or painful. You can learn these exercises at physical therapy visits and practice them at home. Physical therapy may also involve: Massage. Heat wraps or applying heat or cold to affected areas. Electrical signals that interrupt pain signals (transcutaneous electrical nerve stimulation, TENS). Weak lasers that reduce pain and swelling (low-level laser therapy). Signals from your body that help you learn to regulate pain (biofeedback). Occupational therapy. This helps you to learn ways to function at home and work with less pain. Recreational therapy. This involves trying new activities or hobbies, such as a physical activity or drawing. Mental health therapy, including: Cognitive behavioral therapy (CBT). This helps you learn coping skills for dealing with pain. Acceptance and commitment therapy (ACT) to change the way you think and react to pain. Relaxation therapies, including muscle relaxation exercises and  mindfulness-based stress reduction. Pain management counseling. This may be individual, family, or group counseling.  Receive medical treatments Medical treatments for pain management include: Nerve block injections. These may include a pain blocker and anti-inflammatory medicines. You may have injections: Near the spine to relieve chronic back or neck pain. Into joints to relieve back or joint pain. Into nerve areas that supply a painful area to relieve body pain. Into muscles (trigger point injections) to relieve some painful muscle conditions. A medical device placed near your spine to help block pain signals and relieve nerve pain or chronic back pain (spinal cord stimulation device). Acupuncture. Follow these instructions at home Medicines Take over-the-counter and prescription medicines only as told by your health care provider. If you are taking pain medicine, ask your health care providers about possible side effects to watch out for. Do not drive or use heavy machinery while taking prescription opioid pain medicine.  Lifestyle  Do not use drugs or alcohol to reduce pain. If you drink alcohol, limit how much you have to: 0-1 drink a day for women who are not pregnant. 0-2 drinks a day for men. Know how much alcohol is in a drink. In the [place] one drink equals one 12 oz bottle of beer (355 mL), one 5 oz glass of wine (148 mL), or one 1 oz glass of hard liquor (44 mL). Do not use any products that contain nicotine or tobacco. These products include cigarettes, chewing tobacco, and vaping devices, such as e-cigarettes. If you need help quitting, ask your health care provider. Eat a healthy diet and maintain a healthy weight. Poor diet and excess weight may make pain worse. Eat foods that are high in fiber. These include fresh fruits and vegetables, whole grains, and beans. Limit foods that are high in fat and processed sugars, such as fried and sweet foods. Exercise regularly.  Exercise lowers stress and may help relieve pain. Ask your health care provider what activities and exercises are safe for you. If your health care provider approves, join an exercise class that combines movement and stress reduction. Examples include yoga and tai chi. Get enough sleep. Lack of sleep may make pain worse. Lower stress as much as possible. Practice stress reduction techniques as told by your therapist. General instructions Work with all your pain management providers to find the treatments that work best for you. You are an important member of your pain management team. There are many things you can do to reduce pain on your own. Consider joining an online or in-person support group for people who have chronic pain. Keep all follow-up visits. This is important. Where to find more information You can find more information about managing pain without opioids from: American Academy of Pain Medicine: painmed.org Institute for Chronic Pain: instituteforchronicpain.org American Chronic Pain Association: theacpa.org Contact a health care provider if: You have side effects from pain medicine. Your pain gets worse or does not get better with treatments or home therapy. You are struggling with anxiety or depression. Summary Many types of pain can be managed without opioids. Chronic pain may respond better to pain management without opioids. Pain is best managed when you and a team of health care providers work together. Pain management without opioids may include non-opioid medicines, medical treatments, physical therapy, mental health therapy, and lifestyle changes. Tell your health care providers if your pain gets worse or is not being managed well enough. This information is not intended to replace advice given to you by your health care provider. Make sure you discuss any questions you have with your health care provider. Document Revised: 08/16/2020 Document Reviewed:  08/16/2020 Elsevier Patient Education  2024 [employer].

## 2023-01-28 NOTE — Progress Notes (Signed)
 Because this visit was a virtual/telehealth visit,  certain criteria was not obtained, such a blood pressure, CBG if patient is a diabetic, and timed get up and go. Any medications not marked as "taking" was not mentioned during the medication reconciliation part of the visit. Any vitals not documented were not able to be obtained due to this being a telehealth visit. Vitals that have been documented are verbally provided by the patient.  Patient was unable to self-report a recent blood pressure reading due to a lack of equipment at home via telehealth.  Subjective:   Cheryl Swanson is a 89 y.o. female who presents for an Initial Medicare Annual Wellness Visit.  Visit Complete: Virtual  I connected with  Cheryl Swanson on 01/28/23 by a audio enabled telemedicine application and verified that I am speaking with the correct person using two identifiers.  Patient Location: Home  Provider Location: Home Office  I discussed the limitations of evaluation and management by telemedicine. The patient expressed understanding and agreed to proceed.  Patient Medicare AWV questionnaire was completed by the patient on na; I have confirmed that all information answered by patient is correct and no changes since this date.  Review of Systems     Cardiac Risk Factors include: advanced age (>55men, >67 women);dyslipidemia;hypertension;sedentary lifestyle     Objective:    Today's Vitals   01/28/23 0827 01/28/23 0832  Weight: 111 lb (50.3 kg)   Height: 5' 6.5" (1.689 m)   PainSc:  7    Body mass index is 17.65 kg/m.     01/28/2023    8:27 AM 09/23/2022    7:45 AM 11/29/2021    5:59 PM 10/02/2021    1:53 PM 04/04/2020    3:15 PM 09/28/2019    3:35 PM 09/02/2019    2:30 PM  Advanced Directives  Does Patient Have a Medical Advance Directive? Yes No No No No No No  Type of Estate agent of [place];Living will        Does patient want to make changes to medical advance  directive? No - Patient declined        Copy of Healthcare Power of Attorney in Chart? Yes - validated most recent copy scanned in chart (See row information)        Would patient like information on creating a medical advance directive?  No - Patient declined  No - Patient declined No - Patient declined No - Patient declined No - Patient declined    Current Medications (verified) Outpatient Encounter Medications as of 01/28/2023  Medication Sig   B Complex Vitamins (VITAMIN B COMPLEX) TABS as directed Orally   calcium-vitamin D (OSCAL WITH D) 500-200 MG-UNIT per tablet Take 1 tablet by mouth 2 (two) times daily.   carboxymethylcellulose (REFRESH PLUS) 0.5 % SOLN Place 1 drop into both eyes 2 (two) times daily as needed (dry eyes).   Cholecalciferol (VITAMIN D3) 50 MCG (2000 UT) capsule 1 capsule Orally Once a day   dorzolamide-timolol (COSOPT) 2-0.5 % ophthalmic solution Place 1 drop into the right eye 2 (two) times daily.   folic acid (FOLVITE) 1 MG tablet 1 tablet Orally Once a day for 90 days   furosemide (LASIX) 40 MG tablet Take 1 tablet (40 mg total) by mouth daily.   HYDROcodone-acetaminophen (NORCO/VICODIN) 5-325 MG tablet Take 2 tablets by mouth every 6 (six) hours as needed for moderate pain.   latanoprost (XALATAN) 0.005 % ophthalmic solution Place 1 drop into both  eyes at bedtime.    methotrexate (RHEUMATREX) 2.5 MG tablet Take 4 tabs once a week for 1 month, get labs, if ok, then increase to 6 tabs once a week for 1 month, get labs, if ok, then increase to 8 tabs once a week. Orally weekly for 90 days   potassium chloride SA (KLOR-CON M) 20 MEQ tablet Take 20 mEq by mouth daily.   warfarin (COUMADIN) 5 MG tablet Take 2.5 mg by mouth daily.   No facility-administered encounter medications on file as of 01/28/2023.    Allergies (verified) Patient has no known allergies.   History: Past Medical History:  Diagnosis Date   Arthritis    COPD (chronic obstructive pulmonary  disease) (HCC)    Glaucoma    Mitral valve disorder    Peptic ulcer    Rheumatoid arthritis(714.0)    Tricuspid valve disorder    Past Surgical History:  Procedure Laterality Date   ABDOMINAL HYSTERECTOMY  1971   HAND SURGERY  1980's   left   Family History  Problem Relation Age of Onset   Cancer Mother    Cancer Father    Cancer Brother    Cancer Brother    Cancer Son        prostate-remission   Social History   Socioeconomic History   Marital status: Widowed    Spouse name: Not on file   Number of children: Not on file   Years of education: Not on file   Highest education level: Not on file  Occupational History   Not on file  Tobacco Use   Smoking status: Never   Smokeless tobacco: Never  Vaping Use   Vaping status: Never Used  Substance and Sexual Activity   Alcohol use: No   Drug use: No   Sexual activity: Not on file  Other Topics Concern   Not on file  Social History Narrative   Not on file   Social Determinants of Health   Financial Resource Strain: Low Risk  (01/28/2023)   Overall Financial Resource Strain (CARDIA)    Difficulty of Paying Living Expenses: Not hard at all  Food Insecurity: No Food Insecurity (01/28/2023)   Hunger Vital Sign    Worried About Running Out of Food in the Last Year: Never true    Ran Out of Food in the Last Year: Never true  Transportation Needs: No Transportation Needs (01/28/2023)   PRAPARE - Administrator, Civil Service (Medical): No    Lack of Transportation (Non-Medical): No  Physical Activity: Insufficiently Active (01/28/2023)   Exercise Vital Sign    Days of Exercise per Week: 2 days    Minutes of Exercise per Session: 20 min  Stress: No Stress Concern Present (01/28/2023)   [employer] of Occupational Health - Occupational Stress Questionnaire    Feeling of Stress : Not at all  Social Connections: Moderately Isolated (01/28/2023)   Social Connection and Isolation Panel [NHANES]    Frequency  of Communication with Friends and Family: More than three times a week    Frequency of Social Gatherings with Friends and Family: More than three times a week    Attends Religious Services: More than 4 times per year    Active Member of [employer] or Organizations: No    Attends Banker Meetings: Never    Marital Status: Widowed    Tobacco Counseling Counseling given: Yes   Clinical Intake:  Pre-visit preparation completed: Yes  Pain : 0-10 Pain  Score: 7  Pain Type: Chronic pain Pain Location: Knee Pain Orientation: Right, Left Pain Descriptors / Indicators: Aching Pain Onset: More than a month ago Pain Frequency: Intermittent (pt states the pain is worse when she has to bend her knees to get up or sit down)     BMI - recorded: 17.65 Nutritional Status: BMI <19  Underweight Nutritional Risks: None Diabetes: No  How often do you need to have someone help you when you read instructions, pamphlets, or other written materials from your doctor or pharmacy?: 1 - Never  Interpreter Needed?: No  Information entered by ::  Saralee Bolick, CMA   Activities of Daily Living    01/28/2023    8:44 AM 09/23/2022   12:28 PM  In your present state of health, do you have any difficulty performing the following activities:  Hearing? 0 0  Vision? 0 0  Difficulty concentrating or making decisions? 0 0  Walking or climbing stairs? 1 1  Comment due to chronic pain and she uses a walker or cane   Dressing or bathing? 1 0  Comment needs assistant with bathing   Doing errands, shopping? 1 0  Comment someone drives her   Preparing Food and eating ? Y   Comment patient's daughter helps prepare food for her   Using the Toilet? N   In the past six months, have you accidently leaked urine? N   Do you have problems with loss of bowel control? N   Managing your Medications? N   Managing your Finances? N   Housekeeping or managing your Housekeeping? Y   Comment needs help with cleaning  and patients daughter helps as much as she can.     Patient Care Team: Billie Lade, MD as PCP - General (Internal Medicine) Pricilla Riffle, MD as PCP - Cardiology (Cardiology) Annie Paras, MD as Consulting Physician (Rheumatology) Joellyn Rued, MD (Ophthalmology) Roma Schanz, PA-C as Physician Assistant (Orthopedic Surgery)  Indicate any recent Medical Services you may have received from other than [facility] providers in the past year (date may be approximate).     Assessment:   This is a routine wellness examination for Truly.  Hearing/Vision screen Hearing Screening - Comments:: Patient states she can hear very well out of left ear but has decreased hearing in right ear. She declines referral today for testing Vision Screening - Comments:: Patient states they wear reading glasses only. The patient is up to date with yearly eye exams    Goals Addressed             This Visit's Progress    Patient Stated       Remain as active and independent as I can       Depression Screen    01/28/2023    8:39 AM 12/03/2022    3:07 PM  PHQ 2/9 Scores  PHQ - 2 Score 0 0    Fall Risk    01/28/2023    8:44 AM 12/03/2022    3:07 PM  Fall Risk   Falls in the past year? 0 0  Number falls in past yr: 0 0  Injury with Fall? 0 0  Risk for fall due to : Impaired balance/gait;Orthopedic patient;Impaired mobility   Follow up Education provided;Falls prevention discussed     MEDICARE RISK AT HOME: Medicare Risk at Home Any stairs in or around the home?: No If so, are there any without handrails?: No Home free of loose throw rugs in  walkways, pet beds, electrical cords, etc?: Yes Adequate lighting in your home to reduce risk of falls?: Yes Life alert?: No Use of a cane, walker or w/c?: Yes Grab bars in the bathroom?: Yes Shower chair or bench in shower?: Yes Elevated toilet seat or a handicapped toilet?: Yes  TIMED UP AND GO:  Was the test performed? No     Cognitive Function:        01/28/2023    8:37 AM  6CIT Screen  What Year? 0 points  What month? 0 points  What time? 0 points  Count back from 20 0 points  Months in reverse 0 points  Repeat phrase 6 points  Total Score 6 points    Immunizations Immunization History  Administered Date(s) Administered   Influenza-Unspecified 01/19/2020   Moderna Sars-Covid-2 Vaccination 06/26/2019, 07/27/2019, 03/24/2020   Pneumococcal Polysaccharide-23 02/19/2012   Td 10/24/2006    TDAP status: Due, Education has been provided regarding the importance of this vaccine. Advised may receive this vaccine at local pharmacy or Health Dept. Aware to provide a copy of the vaccination record if obtained from local pharmacy or Health Dept. Verbalized acceptance and understanding.  Flu Vaccine status: Due, Education has been provided regarding the importance of this vaccine. Advised may receive this vaccine at local pharmacy or Health Dept. Aware to provide a copy of the vaccination record if obtained from local pharmacy or Health Dept. Verbalized acceptance and understanding.  Pneumococcal vaccine status: Due, Education has been provided regarding the importance of this vaccine. Advised may receive this vaccine at local pharmacy or Health Dept. Aware to provide a copy of the vaccination record if obtained from local pharmacy or Health Dept. Verbalized acceptance and understanding.  Covid-19 vaccine status: Information provided on how to obtain vaccines.   Qualifies for Shingles Vaccine? Yes   Zostavax completed No   Shingrix Completed?: No.    Education has been provided regarding the importance of this vaccine. Patient has been advised to call insurance company to determine out of pocket expense if they have not yet received this vaccine. Advised may also receive vaccine at local pharmacy or Health Dept. Verbalized acceptance and understanding.  Screening Tests Health Maintenance  Topic Date Due    Medicare Annual Wellness (AWV)  Never done   Zoster Vaccines- Shingrix (1 of 2) Never done   Pneumonia Vaccine 26+ Years old (2 of 2 - PCV) 02/18/2013   DTaP/Tdap/Td (2 - Tdap) 10/23/2016   INFLUENZA VACCINE  12/19/2022   COVID-19 Vaccine (4 - 2023-24 season) 01/19/2023   DEXA SCAN  Completed   HPV VACCINES  Aged Out    Health Maintenance  Health Maintenance Due  Topic Date Due   Medicare Annual Wellness (AWV)  Never done   Zoster Vaccines- Shingrix (1 of 2) Never done   Pneumonia Vaccine 55+ Years old (2 of 2 - PCV) 02/18/2013   DTaP/Tdap/Td (2 - Tdap) 10/23/2016   INFLUENZA VACCINE  12/19/2022   COVID-19 Vaccine (4 - 2023-24 season) 01/19/2023    Colorectal cancer screening: No longer required.   Mammogram status: No longer required due to age.  Bone Density Screening: Not age appropriate for this patient.   Lung Cancer Screening: (Low Dose CT Chest recommended if Age 5-80 years, 20 pack-year currently smoking OR have quit w/in 15years.) does not qualify.   Lung Cancer Screening Referral: na  Additional Screening:  Hepatitis C Screening: does not qualify;    Vision Screening: Recommended annual ophthalmology exams for early detection  of glaucoma and other disorders of the eye. Is the patient up to date with their annual eye exam?  Yes  Who is the provider or what is the name of the office in which the patient attends annual eye exams? Dr. Charise Killian,. [place] and DR. Youlanda Roys in [place] If pt is not established with a provider, would they like to be referred to a provider to establish care? No .   Dental Screening: Recommended annual dental exams for proper oral hygiene  Diabetic Foot Exam: na  Community Resource Referral / Chronic Care Management: CRR required this visit?  Yes   CCM required this visit?  No     Plan:     I have personally reviewed and noted the following in the patient's chart:   Medical and social history Use of alcohol, tobacco  or illicit drugs  Current medications and supplements including opioid prescriptions. Patient is currently taking opioid prescriptions. Information provided to patient regarding non-opioid alternatives. Patient advised to discuss non-opioid treatment plan with their provider. Functional ability and status Nutritional status Physical activity Advanced directives List of other physicians Hospitalizations, surgeries, and ER visits in previous 12 months Vitals Screenings to include cognitive, depression, and falls Referrals and appointments  In addition, I have reviewed and discussed with patient certain preventive protocols, quality metrics, and best practice recommendations. A written personalized care plan for preventive services as well as general preventive health recommendations were provided to patient.     Jordan Hawks Ryen Heitmeyer, CMA   01/28/2023   After Visit Summary: (Mail) Due to this being a telephonic visit, the after visit summary with patients personalized plan was offered to patient via mail   Nurse Notes: Community Resource Referral placed for patient today to see if any additional resources are available to have someone come out and assist patient with ADL's and light house keeping

## 2023-01-29 ENCOUNTER — Ambulatory Visit: Payer: Medicare Other | Attending: Internal Medicine | Admitting: *Deleted

## 2023-01-29 DIAGNOSIS — I2782 Chronic pulmonary embolism: Secondary | ICD-10-CM

## 2023-01-29 DIAGNOSIS — Z5181 Encounter for therapeutic drug level monitoring: Secondary | ICD-10-CM

## 2023-01-29 DIAGNOSIS — I824Y1 Acute embolism and thrombosis of unspecified deep veins of right proximal lower extremity: Secondary | ICD-10-CM

## 2023-01-29 DIAGNOSIS — R791 Abnormal coagulation profile: Secondary | ICD-10-CM

## 2023-01-29 DIAGNOSIS — I2609 Other pulmonary embolism with acute cor pulmonale: Secondary | ICD-10-CM | POA: Diagnosis present

## 2023-01-29 LAB — POCT INR: INR: 2.2 (ref 2.0–3.0)

## 2023-01-29 NOTE — Patient Instructions (Addendum)
Continue warfarin 1/2 tablet daily Recheck in 3 weeks For questions call [phone] Warfarin refills should come from Dr Dietrich Pates

## 2023-01-30 ENCOUNTER — Encounter: Payer: Medicare Other | Admitting: Internal Medicine

## 2023-02-10 ENCOUNTER — Telehealth: Payer: Self-pay | Admitting: Internal Medicine

## 2023-02-10 NOTE — Telephone Encounter (Signed)
Patient called has a rash now for a week now she does not feel like it is a medication reaction and she has not been outside.  Ask could provider call something into her pharmacy like a cream to rub on it to stop the itching, had it once before with Dr Sudie Bailey office before and gave her some type of red pill.  Patient is 89 years old and can not get out to come in the office this week. Patient asked for a call back if so [phone] Pharmacy: [facility]

## 2023-02-10 NOTE — Telephone Encounter (Signed)
Patient will try to get son to take a picture and come show [place]

## 2023-02-11 MED ORDER — TRIAMCINOLONE ACETONIDE 0.1 % EX CREA: 1.0000 | TOPICAL_CREAM | Freq: Two times a day (BID) | CUTANEOUS | 0 refills | Status: AC

## 2023-02-11 NOTE — Telephone Encounter (Signed)
Patient's son came by with pictures of Ms. Niebuhr's rash. There are excoriated lesions on each arm. She has tried hydrocortisone cream without significant improvement. I have recommend Benadryl cream and will also prescribe triamcinolone 0.1% cream for as needed application.

## 2023-02-12 ENCOUNTER — Telehealth: Payer: Self-pay | Admitting: *Deleted

## 2023-02-12 NOTE — Progress Notes (Signed)
Care Coordination   Note   02/12/2023 Name: Cheryl Swanson MRN: [id number] DOB: April 22, 1932  Cheryl Swanson is a 89 y.o. year old female who sees Durwin Nora, Lucina Mellow, MD for primary care. I reached out to Julianne Handler by phone today in response to a referral  Follow up plan:  Telephone appointment with care coordination team member scheduled for:  02/18/2023  Encounter Outcome:  Patient Scheduled from referral   Burman Nieves, [facility] Care Coordination Care Guide Direct Dial: [phone]

## 2023-02-18 ENCOUNTER — Ambulatory Visit: Payer: Self-pay

## 2023-02-18 NOTE — Patient Outreach (Signed)
  Care Coordination   Initial Visit Note   02/18/2023 Name: Cheryl Swanson MRN: [id number] DOB: 1931/10/15  Cheryl Swanson is a 89 y.o. year old female who sees Cheryl Swanson, Cheryl Mellow, MD for primary care. I spoke with  Cheryl Swanson by phone today.  What matters to the patients health and wellness today?  Assistance with In-Home needs    Goals Addressed             This Visit's Progress    Care Coordination Activities        Care Coordination Interventions: Patient stated that she needs a caregiver in th home. Patients daughter will assist with needs in the home but is not always available. Patient declined Meals on Wheels stating that her daughter does assist with meals Patient declined and transportation issues and stated that the family will assist. SW discussed with patient about Medicaid and the patient does not qualify due to over income. SW will Tax inspector on ADTS caregiver program.        SDOH assessments and interventions completed:  Yes  SDOH Interventions Today    Flowsheet Row Most Recent Value  SDOH Interventions   Food Insecurity Interventions Intervention Not Indicated  [Patient declined referral for meals on wheels]  Transportation Interventions Intervention Not Indicated  [Patient stated that the family provides transportation]        Care Coordination Interventions:  Yes, provided   Interventions Today    Flowsheet Row Most Recent Value  General Interventions   General Interventions Discussed/Reviewed General Interventions Discussed, Community Resources  Education Interventions   Education Provided Provided Printed Education  [SW will mail informtion to patient for ADTS]  Nutrition Interventions   Nutrition Discussed/Reviewed Nutrition Discussed  [Patient stated that the family assist with meals]        Follow up plan: Follow up call scheduled for 03/11/2023 at !;30 pm    Encounter Outcome:  Patient Visit Completed   Cheryl Swanson, BSW, CDP [facility]  [facility], [facility] Social Worker Direct Dial: [phone]  Fax: [phone]

## 2023-02-18 NOTE — Patient Instructions (Signed)
Visit Information  Thank you for taking time to visit with me today. Please don't hesitate to contact me if I can be of assistance to you.   Following are the goals we discussed today:   Goals Addressed             This Visit's Progress    Care Coordination Activities        Care Coordination Interventions: Patient stated that she needs a caregiver in th home. Patients daughter will assist with needs in the home but is not always available. Patient declined Meals on Wheels stating that her daughter does assist with meals Patient declined and transportation issues and stated that the family will assist. SW discussed with patient about Medicaid and the patient does not qualify due to over income. SW will mail resource information on ADTS caregiver program.        Our next appointment is by telephone on 03/11/2023 at 1:30 pm  Please call the care guide team at [phone] if you need to cancel or reschedule your appointment.   If you are experiencing a Mental Health or Behavioral Health Crisis or need someone to talk to, please call 1-800-273-TALK (toll free, 24 hour hotline) call the [facility]: [phone]  The patient verbalized understanding of instructions, educational materials, and care plan provided today and DECLINED offer to receive copy of patient instructions, educational materials, and care plan.   Bevelyn Ngo, BSW, CDP [facility]  [facility], [facility] Social Worker Direct Dial: [phone]  Fax: [phone]

## 2023-02-19 ENCOUNTER — Ambulatory Visit: Payer: Medicare Other | Attending: Internal Medicine | Admitting: *Deleted

## 2023-02-19 DIAGNOSIS — Z5181 Encounter for therapeutic drug level monitoring: Secondary | ICD-10-CM | POA: Diagnosis not present

## 2023-02-19 DIAGNOSIS — I2782 Chronic pulmonary embolism: Secondary | ICD-10-CM | POA: Diagnosis present

## 2023-02-19 DIAGNOSIS — I2609 Other pulmonary embolism with acute cor pulmonale: Secondary | ICD-10-CM

## 2023-02-19 LAB — POCT INR
INR: 2.4 (ref 2.0–3.0)
INR: 2.4 (ref 2.0–3.0)

## 2023-02-19 MED ORDER — WARFARIN SODIUM 5 MG PO TABS: 2.5000 mg | ORAL_TABLET | Freq: Every day | ORAL | 3 refills | Status: DC

## 2023-02-19 NOTE — Patient Instructions (Signed)
Continue warfarin 1/2 tablet daily Recheck in 4 weeks

## 2023-03-04 ENCOUNTER — Encounter: Payer: Self-pay | Admitting: Internal Medicine

## 2023-03-04 ENCOUNTER — Ambulatory Visit (INDEPENDENT_AMBULATORY_CARE_PROVIDER_SITE_OTHER): Payer: Medicare Other | Admitting: Internal Medicine

## 2023-03-04 VITALS — BP 148/72 | HR 75 | Ht 66.0 in | Wt 110.0 lb

## 2023-03-04 DIAGNOSIS — Z23 Encounter for immunization: Secondary | ICD-10-CM | POA: Diagnosis not present

## 2023-03-04 DIAGNOSIS — Z7901 Long term (current) use of anticoagulants: Secondary | ICD-10-CM

## 2023-03-04 DIAGNOSIS — E785 Hyperlipidemia, unspecified: Secondary | ICD-10-CM

## 2023-03-04 DIAGNOSIS — I2694 Multiple subsegmental pulmonary emboli without acute cor pulmonale: Secondary | ICD-10-CM

## 2023-03-04 DIAGNOSIS — Z86711 Personal history of pulmonary embolism: Secondary | ICD-10-CM | POA: Diagnosis not present

## 2023-03-04 DIAGNOSIS — E559 Vitamin D deficiency, unspecified: Secondary | ICD-10-CM | POA: Diagnosis not present

## 2023-03-04 DIAGNOSIS — M057A Rheumatoid arthritis with rheumatoid factor of other specified site without organ or systems involvement: Secondary | ICD-10-CM

## 2023-03-04 MED ORDER — WARFARIN SODIUM 5 MG PO TABS: 2.5000 mg | ORAL_TABLET | Freq: Every day | ORAL | 3 refills | Status: DC

## 2023-03-04 NOTE — Progress Notes (Signed)
Established Patient Office Visit  Subjective   Patient ID: Cheryl Swanson, female    DOB: 1932-03-18  Age: 89 y.o. MRN: [id number]  Chief Complaint  Patient presents with   Follow-up    Knee pain from rheumatoid arthritis and trouble w/ vision.    Cheryl Swanson returns to care today for routine follow-up.  She was last evaluated by me on 7/16 as a new patient presenting to establish care.  Atorvastatin was discontinued and a referral was placed to cardiology for warfarin management.  In the interim, the patient established care with cardiology (Dr. Tenny Craw) on 9/5.  There have otherwise been no acute interval events.  Cheryl Swanson reports feeling well today.  She endorses chronic joint pain but is otherwise asymptomatic and has no acute concerns to discuss.  Past Medical History:  Diagnosis Date   Arthritis    COPD (chronic obstructive pulmonary disease) (HCC)    Glaucoma    Mitral valve disorder    Peptic ulcer    Rheumatoid arthritis(714.0)    Tricuspid valve disorder    Past Surgical History:  Procedure Laterality Date   ABDOMINAL HYSTERECTOMY  1971   HAND SURGERY  1980's   left   Social History   Tobacco Use   Smoking status: Never   Smokeless tobacco: Never  Vaping Use   Vaping status: Never Used  Substance Use Topics   Alcohol use: No   Drug use: No   Family History  Problem Relation Age of Onset   Cancer Mother    Cancer Father    Cancer Brother    Cancer Brother    Cancer Son        prostate-remission   No Known Allergies  Review of Systems  Musculoskeletal:  Positive for joint pain (Chronic, unchanged).  All other systems reviewed and are negative.    Objective:     BP (!) 148/72 (BP Location: Left Arm, Patient Position: Sitting, Cuff Size: Normal)   Pulse 75   Ht 5\' 6"  (1.676 m)   Wt 110 lb (49.9 kg)   SpO2 96%   BMI 17.75 kg/m  BP Readings from Last 3 Encounters:  03/04/23 (!) 148/72  01/23/23 134/60  12/03/22 (!) 146/75   Physical  Exam Vitals reviewed.  Constitutional:      General: She is not in acute distress.    Appearance: Normal appearance. She is not toxic-appearing.  HENT:     Head: Normocephalic and atraumatic.     Right Ear: External ear normal.     Left Ear: External ear normal.     Nose: Nose normal. No congestion or rhinorrhea.     Mouth/Throat:     Mouth: Mucous membranes are moist.     Pharynx: Oropharynx is clear. No oropharyngeal exudate or posterior oropharyngeal erythema.  Eyes:     General: No scleral icterus.    Extraocular Movements: Extraocular movements intact.     Conjunctiva/sclera: Conjunctivae normal.     Pupils: Pupils are equal, round, and reactive to light.  Cardiovascular:     Rate and Rhythm: Normal rate and regular rhythm.     Pulses: Normal pulses.     Heart sounds: Normal heart sounds. No murmur heard.    No friction rub. No gallop.  Pulmonary:     Effort: Pulmonary effort is normal.     Breath sounds: Normal breath sounds. No wheezing, rhonchi or rales.  Abdominal:     General: Abdomen is flat. Bowel sounds are normal.  There is no distension.     Palpations: Abdomen is soft.     Tenderness: There is no abdominal tenderness.  Musculoskeletal:        General: Deformity (Ulnar deviation of both hands as well as nodular deformities along the MCPs of each hand) present. No swelling. Normal range of motion.     Cervical back: Normal range of motion.     Right lower leg: No edema.     Left lower leg: No edema.  Lymphadenopathy:     Cervical: No cervical adenopathy.  Skin:    General: Skin is warm and dry.     Capillary Refill: Capillary refill takes less than 2 seconds.     Coloration: Skin is not jaundiced.  Neurological:     General: No focal deficit present.     Mental Status: She is alert and oriented to person, place, and time.  Psychiatric:        Mood and Affect: Mood normal.        Behavior: Behavior normal.   Last CBC Lab Results  Component Value Date    WBC 3.9 03/04/2023   HGB 13.6 03/04/2023   HCT 42.3 03/04/2023   MCV 92 03/04/2023   MCH 29.4 03/04/2023   RDW 15.9 (H) 03/04/2023   PLT 353 03/04/2023   Last metabolic panel Lab Results  Component Value Date   GLUCOSE 84 03/04/2023   NA 140 03/04/2023   K 4.1 03/04/2023   CL 104 03/04/2023   CO2 24 03/04/2023   BUN 10 03/04/2023   CREATININE 0.72 03/04/2023   GFRNONAA >60 09/25/2022   CALCIUM 10.1 03/04/2023   PHOS 2.2 (L) 09/25/2022   PROT 7.0 03/04/2023   ALBUMIN 4.0 03/04/2023   LABGLOB 3.0 03/04/2023   BILITOT 0.9 03/04/2023   ALKPHOS 84 03/04/2023   AST 20 03/04/2023   ALT 6 03/04/2023   ANIONGAP 7 09/25/2022   Last lipids Lab Results  Component Value Date   CHOL 224 (H) 03/04/2023   HDL 78 03/04/2023   LDLCALC 133 (H) 03/04/2023   TRIG 76 03/04/2023   CHOLHDL 2.9 03/04/2023   Last thyroid functions Lab Results  Component Value Date   TSH 2.680 03/04/2023   Last vitamin D Lab Results  Component Value Date   VD25OH 42.4 03/04/2023   Last vitamin B12 and Folate Lab Results  Component Value Date   VITAMINB12 727 03/04/2023   FOLATE >20.0 03/04/2023     Assessment & Plan:   Problem List Items Addressed This Visit       Pulmonary embolism [facility])    Cardiology referral placed at her last appointment for warfarin management.  Evaluated by cardiology on 9/5.  Reports that her dose has not changed.  Requesting warfarin refill today.      Rheumatoid arthritis (HCC) - Primary    Remains on methotrexate.  Symptoms are stable.  Followed by [facility] rheumatology.      Hyperlipidemia    Atorvastatin was discontinued at her last appointment in the absence of known cardiovascular disease.  Repeat lipid panel ordered today.      Need for influenza vaccination    Influenza vaccine administered today      Return in about 3 months (around 06/04/2023).   Billie Lade, MD

## 2023-03-04 NOTE — Patient Instructions (Signed)
It was a pleasure to see you today.  Thank you for giving [place] the opportunity to be involved in your care.  Below is a brief recap of your visit and next steps.  We will plan to see you again in 3 months.  Summary No medication changes today Repeat labs ordered Flu shot today Follow up in 3 months

## 2023-03-05 LAB — LIPID PANEL
Chol/HDL Ratio: 2.9 {ratio} (ref 0.0–4.4)
Cholesterol, Total: 224 mg/dL — ABNORMAL HIGH (ref 100–199)
HDL: 78 mg/dL (ref >39)
LDL Chol Calc (NIH): 133 mg/dL — ABNORMAL HIGH (ref 0–99)
Triglycerides: 76 mg/dL (ref 0–149)
VLDL Cholesterol Cal: 13 mg/dL (ref 5–40)

## 2023-03-05 LAB — CMP14+EGFR
ALT: 6 [IU]/L (ref 0–32)
AST: 20 [IU]/L (ref 0–40)
Albumin: 4 g/dL (ref 3.7–4.7)
Alkaline Phosphatase: 84 [IU]/L (ref 44–121)
BUN/Creatinine Ratio: 14 (ref 12–28)
BUN: 10 mg/dL (ref 8–27)
Bilirubin Total: 0.9 mg/dL (ref 0.0–1.2)
CO2: 24 mmol/L (ref 20–29)
Calcium: 10.1 mg/dL (ref 8.7–10.3)
Chloride: 104 mmol/L (ref 96–106)
Creatinine, Ser: 0.72 mg/dL (ref 0.57–1.00)
Globulin, Total: 3 g/dL (ref 1.5–4.5)
Glucose: 84 mg/dL (ref 70–99)
Potassium: 4.1 mmol/L (ref 3.5–5.2)
Sodium: 140 mmol/L (ref 134–144)
Total Protein: 7 g/dL (ref 6.0–8.5)
eGFR: 80 mL/min/{1.73_m2} (ref >59)

## 2023-03-05 LAB — CBC WITH DIFFERENTIAL/PLATELET
Basophils Absolute: 0.1 10*3/uL (ref 0.0–0.2)
Basos: 2 %
EOS (ABSOLUTE): 0.2 10*3/uL (ref 0.0–0.4)
Eos: 5 %
Hematocrit: 42.3 % (ref 34.0–46.6)
Hemoglobin: 13.6 g/dL (ref 11.1–15.9)
Immature Grans (Abs): 0 10*3/uL (ref 0.0–0.1)
Immature Granulocytes: 0 %
Lymphocytes Absolute: 1.4 10*3/uL (ref 0.7–3.1)
Lymphs: 37 %
MCH: 29.4 pg (ref 26.6–33.0)
MCHC: 32.2 g/dL (ref 31.5–35.7)
MCV: 92 fL (ref 79–97)
Monocytes Absolute: 0.5 10*3/uL (ref 0.1–0.9)
Monocytes: 14 %
Neutrophils Absolute: 1.7 10*3/uL (ref 1.4–7.0)
Neutrophils: 42 %
Platelets: 353 10*3/uL (ref 150–450)
RBC: 4.62 x10E6/uL (ref 3.77–5.28)
RDW: 15.9 % — ABNORMAL HIGH (ref 11.7–15.4)
WBC: 3.9 10*3/uL (ref 3.4–10.8)

## 2023-03-05 LAB — VITAMIN D 25 HYDROXY (VIT D DEFICIENCY, FRACTURES): Vit D, 25-Hydroxy: 42.4 ng/mL (ref 30.0–100.0)

## 2023-03-05 LAB — TSH+FREE T4
Free T4: 0.99 ng/dL (ref 0.82–1.77)
TSH: 2.68 u[IU]/mL (ref 0.450–4.500)

## 2023-03-05 LAB — B12 AND FOLATE PANEL
Folate: >20 ng/mL (ref >3.0)
Vitamin B-12: 727 pg/mL (ref 232–1245)

## 2023-03-05 MED ORDER — ATORVASTATIN CALCIUM 20 MG PO TABS: 20.0000 mg | ORAL_TABLET | Freq: Every day | ORAL | 3 refills | Status: DC

## 2023-03-10 ENCOUNTER — Telehealth: Payer: Self-pay | Admitting: Internal Medicine

## 2023-03-10 NOTE — Telephone Encounter (Signed)
Only has a couple days left of warfarin.  According to Epic warfarin was sent to [facility] on 03/04/23 by Dr Darletta Moll office. She will call Pharmacy request refill.

## 2023-03-10 NOTE — Telephone Encounter (Signed)
Pt c/o medication issue:  1. Name of Medication:  warfarin (COUMADIN) 5 MG tablet  2. How are you currently taking this medication (dosage and times per day)?   3. Are you having a reaction (difficulty breathing--STAT)?   4. What is your medication issue?   Patient would like to review medication instructions.

## 2023-03-11 ENCOUNTER — Ambulatory Visit: Payer: Self-pay | Admitting: Licensed Clinical Social Worker

## 2023-03-11 NOTE — Patient Instructions (Signed)
Visit Information  Thank you for taking time to visit with me today. Please don't hesitate to contact me if I can be of assistance to you.   Following are the goals we discussed today:   Goals Addressed             This Visit's Progress    Care Coordination Activities       Care Coordination Interventions: Patient stated that she would like to have a little help in the home during the day, her daughter stays with her at night and gets home around 4:30 pm daily. The Patient stated that her son and daughter both assist with transportation to medical appointments. The SW has requested for community resource information for ADTS to be mailed to the patient, the SW will follow back up with the patient in about 2 weeks.         Our next appointment is by telephone on 03/25/2023 at 1:00-1:30pm  Please call the care guide team at [phone] if you need to cancel or reschedule your appointment.   If you are experiencing a Mental Health or Behavioral Health Crisis or need someone to talk to, please call the Suicide and Crisis Lifeline: 988 go to [facility] [address], [place] [phone]) call 911  The patient verbalized understanding of instructions, educational materials, and care plan provided today and DECLINED offer to receive copy of patient instructions, educational materials, and care plan.

## 2023-03-11 NOTE — Patient Outreach (Signed)
  Care Coordination   Follow Up Visit Note   03/11/2023 Name: Cheryl Swanson MRN: [id number] DOB: 1931-11-12  Cheryl Swanson is a 89 y.o. year old female who sees Durwin Nora, Lucina Mellow, MD for primary care. I spoke with  Julianne Handler by phone today.  What matters to the patients health and wellness today?  Extra assistance in the home  during the day   Goals Addressed             This Visit's Progress    Care Coordination Activities       Care Coordination Interventions: Patient stated that she would like to have a little help in the home during the day, her daughter stays with her at night and gets home around 4:30 pm daily. The Patient stated that her son and daughter both assist with transportation to medical appointments. The SW has requested for community resource information for ADTS to be mailed to the patient, the SW will follow back up with the patient in about 2 weeks.         SDOH assessments and interventions completed:  Yes  SDOH Interventions Today    Flowsheet Row Most Recent Value  SDOH Interventions   Food Insecurity Interventions Intervention Not Indicated  Housing Interventions Intervention Not Indicated  Transportation Interventions Intervention Not Indicated  Social Connections Interventions Other (Comment)  [Sw mailed information on ADTS]        Care Coordination Interventions:  Yes, provided  Interventions Today    Flowsheet Row Most Recent Value  General Interventions   General Interventions Discussed/Reviewed General Interventions Discussed, Community Resources  [Pt is in need of assistance in the home during the day to clean, not a major need because the family does help, SW previously mailed information on ADTS]        Follow up plan: Follow up call scheduled for 03/25/2023 at 1:00pm    Encounter Outcome:  Patient Visit Completed  Jeanie Cooks, PhD [facility], [facility] Social Worker Direct  Dial: [phone]  Fax: [phone]

## 2023-03-19 ENCOUNTER — Ambulatory Visit: Payer: Medicare Other | Attending: Internal Medicine | Admitting: *Deleted

## 2023-03-19 DIAGNOSIS — I2609 Other pulmonary embolism with acute cor pulmonale: Secondary | ICD-10-CM | POA: Insufficient documentation

## 2023-03-19 DIAGNOSIS — I2782 Chronic pulmonary embolism: Secondary | ICD-10-CM | POA: Diagnosis not present

## 2023-03-19 DIAGNOSIS — Z5181 Encounter for therapeutic drug level monitoring: Secondary | ICD-10-CM | POA: Insufficient documentation

## 2023-03-19 LAB — POCT INR: INR: 1.9 — AB (ref 2.0–3.0)

## 2023-03-19 NOTE — Patient Instructions (Signed)
Take warfarin 1 tablet tonight then resume 1/2 tablet daily Recheck in 4 wks

## 2023-03-25 ENCOUNTER — Ambulatory Visit: Payer: Self-pay | Admitting: Licensed Clinical Social Worker

## 2023-03-25 NOTE — Patient Outreach (Signed)
  Care Coordination   Follow Up Visit Note   03/25/2023 Name: Cheryl Swanson MRN: [id number] DOB: 05-23-1931  Cheryl Swanson is a 89 y.o. year old female who sees Durwin Nora, Lucina Mellow, MD for primary care. I spoke with  Julianne Handler by phone today.  What matters to the patients health and wellness today?  HHA and ADTS needs follow up    Goals Addressed             This Visit's Progress    COMPLETED: Care Coordination Activities       Care Coordination Interventions: Patient stated that she would like to have a little help in the home during the day, her daughter stays with her at night and gets home around 4:30 pm daily. The Patient stated that her son and daughter both assist with transportation to medical appointments. The SW has requested for community resource information for ADTS to be mailed to the patient, the SW will follow back up with the patient in about 2 weeks.         SDOH assessments and interventions completed:  Yes Interventions Today    Flowsheet Row Most Recent Value  General Interventions   General Interventions Discussed/Reviewed General Interventions Discussed, General Interventions Reviewed  [Patient stated that she is doing alright and with the help of her daughter things are ok now. SW encourged patient to contact PCP if the SW is needed in the future]           Care Coordination Interventions:  Yes, provided   Follow up plan: No further intervention required.   Encounter Outcome:  Patient Visit Completed   Jeanie Cooks, PhD [facility], [facility] Social Worker Direct Dial: [phone]  Fax: [phone]

## 2023-03-25 NOTE — Patient Instructions (Signed)
Visit Information  Thank you for taking time to visit with me today. Please don't hesitate to contact me if I can be of assistance to you.   Following are the goals we discussed today:   Goals Addressed             This Visit's Progress    COMPLETED: Care Coordination Activities       Care Coordination Interventions: Patient stated that she would like to have a little help in the home during the day, her daughter stays with her at night and gets home around 4:30 pm daily. The Patient stated that her son and daughter both assist with transportation to medical appointments. The SW has requested for community resource information for ADTS to be mailed to the patient, the SW will follow back up with the patient in about 2 weeks.          Please call the care guide team at [phone] if you need to cancel or reschedule your appointment.   If you are experiencing a Mental Health or Behavioral Health Crisis or need someone to talk to, please call the Suicide and Crisis Lifeline: 988 go to [facility] [address] [place] [phone]) call 911  The patient verbalized understanding of instructions, educational materials, and care plan provided today and DECLINED offer to receive copy of patient instructions, educational materials, and care plan.   Jeanie Cooks, PhD [facility], [facility] Social Worker Direct Dial: [phone]  Fax: [phone]

## 2023-04-06 DIAGNOSIS — Z23 Encounter for immunization: Secondary | ICD-10-CM | POA: Insufficient documentation

## 2023-04-06 NOTE — Assessment & Plan Note (Signed)
Influenza vaccine administered today.

## 2023-04-06 NOTE — Assessment & Plan Note (Signed)
Atorvastatin was discontinued at her last appointment in the absence of known cardiovascular disease.  Repeat lipid panel ordered today.

## 2023-04-06 NOTE — Assessment & Plan Note (Signed)
Remains on methotrexate.  Symptoms are stable.  Followed by [facility] rheumatology.

## 2023-04-06 NOTE — Assessment & Plan Note (Signed)
Cardiology referral placed at her last appointment for warfarin management.  Evaluated by cardiology on 9/5.  Reports that her dose has not changed.  Requesting warfarin refill today.

## 2023-04-15 ENCOUNTER — Ambulatory Visit: Payer: Medicare Other | Attending: Internal Medicine | Admitting: *Deleted

## 2023-04-15 DIAGNOSIS — I2782 Chronic pulmonary embolism: Secondary | ICD-10-CM | POA: Insufficient documentation

## 2023-04-15 DIAGNOSIS — I2609 Other pulmonary embolism with acute cor pulmonale: Secondary | ICD-10-CM | POA: Diagnosis not present

## 2023-04-15 DIAGNOSIS — Z5181 Encounter for therapeutic drug level monitoring: Secondary | ICD-10-CM | POA: Insufficient documentation

## 2023-04-15 LAB — POCT INR: INR: 2.8 (ref 2.0–3.0)

## 2023-04-15 NOTE — Patient Instructions (Signed)
Continue warfarin 1/2 tablet daily Recheck in 5 wks

## 2023-05-14 IMAGING — MR MR LUMBAR SPINE W/O CM
5 series · 31 of 48 positions shown · non-contrast
Comparison: May 18, 2020

CLINICAL DATA: Muscle weakness, rheumatoid arthritis in remission

EXAM:
MRI LUMBAR SPINE WITHOUT CONTRAST
TECHNIQUE: Multiplanar, multisequence MR imaging of the lumbar spine was
performed. No intravenous contrast was administered.

[Series 5: T2 · sagittal · 4.0mm · 0.68mm/px · 7 of 15 slices shown (1 of 2)]
[im 1/15]
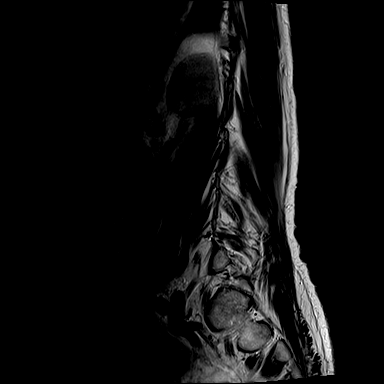
[im 3/15]
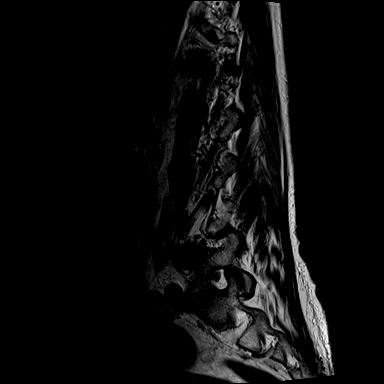
[im 5/15]
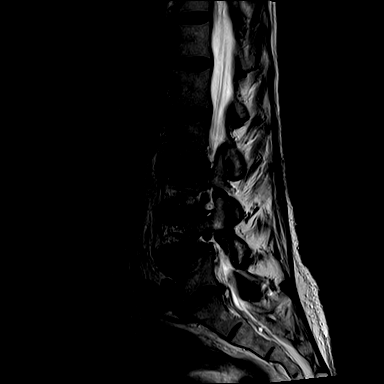
[im 8/15]
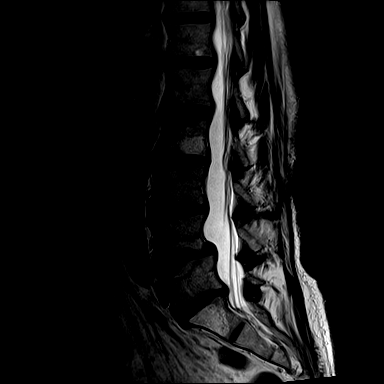
[im 10/15]
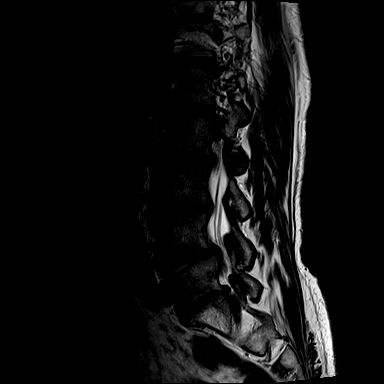
[im 12/15]
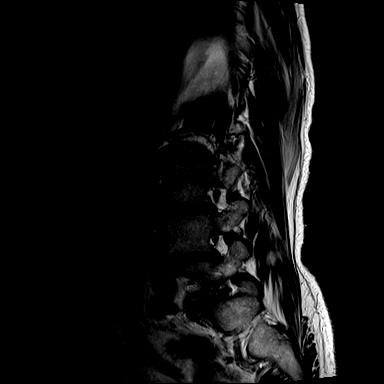
[im 15/15]
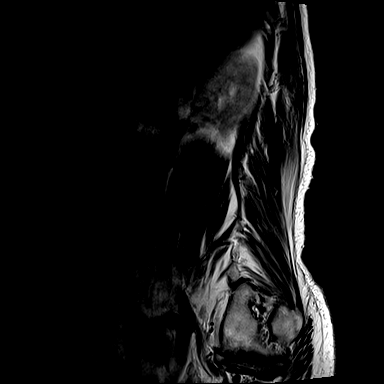

[Series 6: T1 · sagittal · 4.0mm · 0.81mm/px · 7 of 15 slices shown (1 of 2)]
[im 1/15]
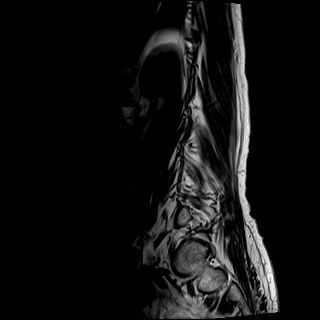
[im 3/15]
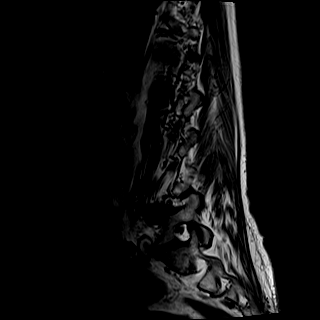
[im 5/15]
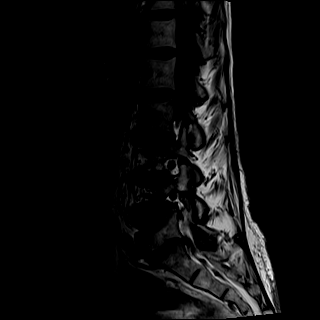
[im 8/15]
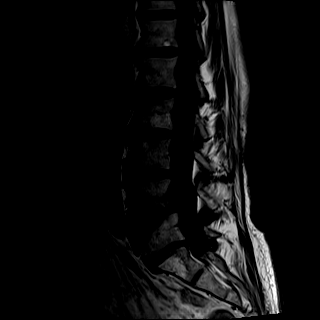
[im 10/15]
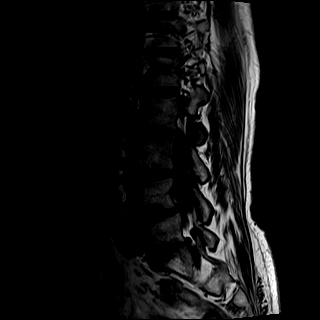
[im 12/15]
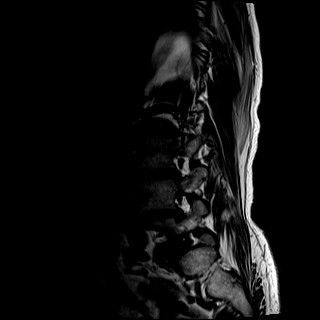
[im 15/15]
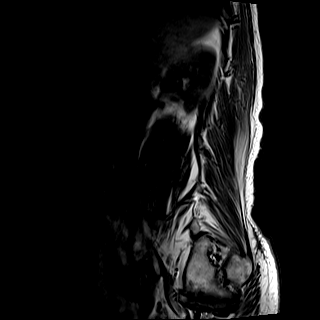

[Series 7: STIR · sagittal · 4.0mm · 0.51mm/px · 1 of 15 slices shown]
[im 1/15]
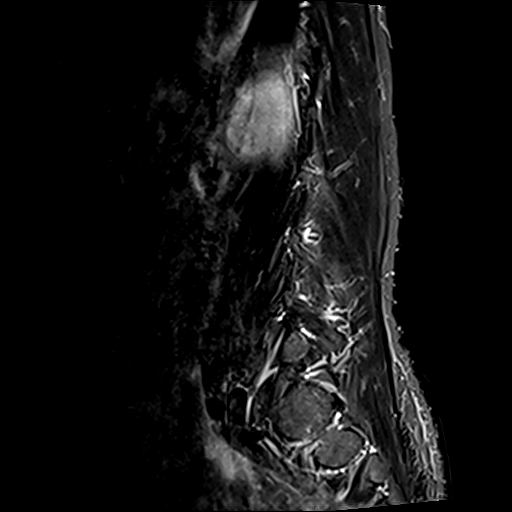

[Series 8: T2 · axial · 4.0mm · 0.70mm/px · z∈[-108,+104]mm · 8 of 34 slices shown (2 of 2)]
[im 1/34]
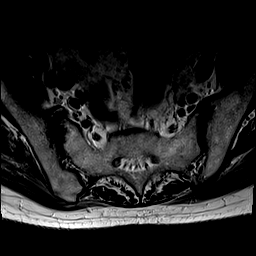
[im 6/34]
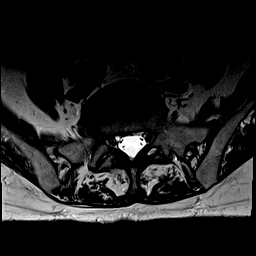
[im 11/34]
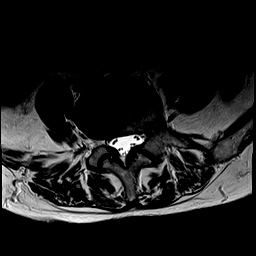
[im 16/34]
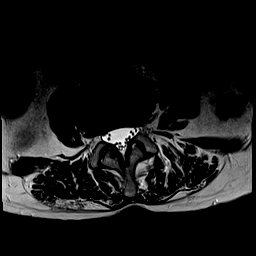
[im 18/34]
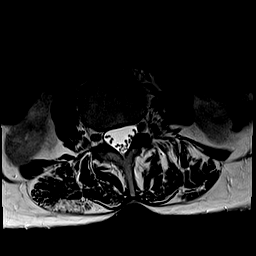
[im 23/34]
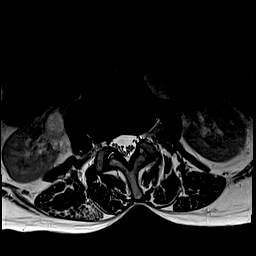
[im 28/34]
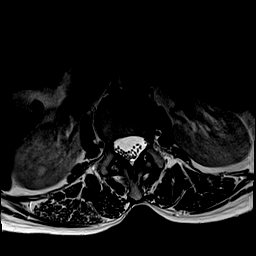
[im 34/34]
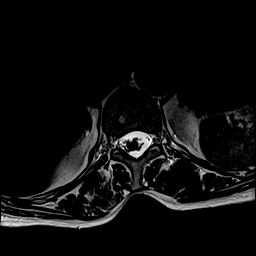

[Series 9: T1 · axial · 4.0mm · 0.35mm/px · z∈[-108,+104]mm · 8 of 34 slices shown (2 of 2)]
[im 1/34]
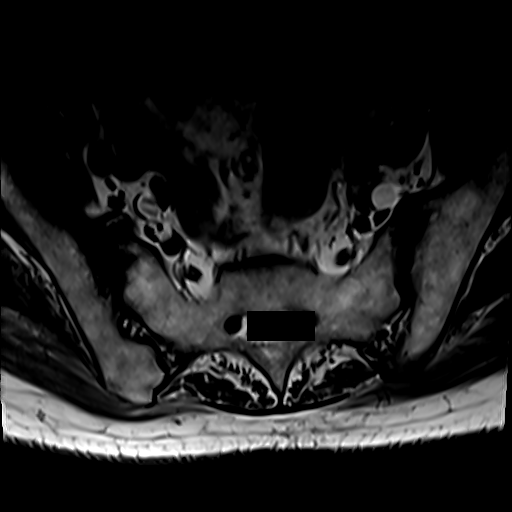
[im 6/34]
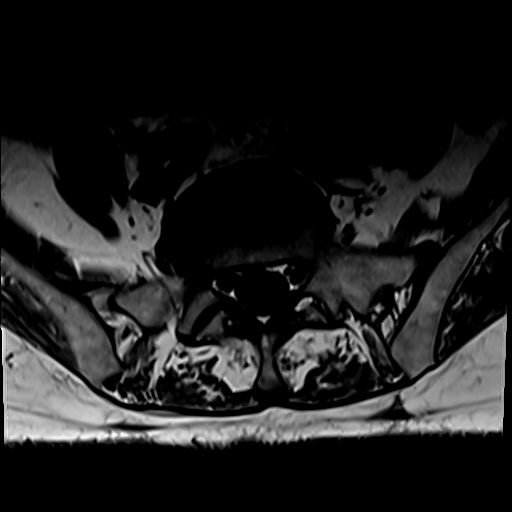
[im 11/34]
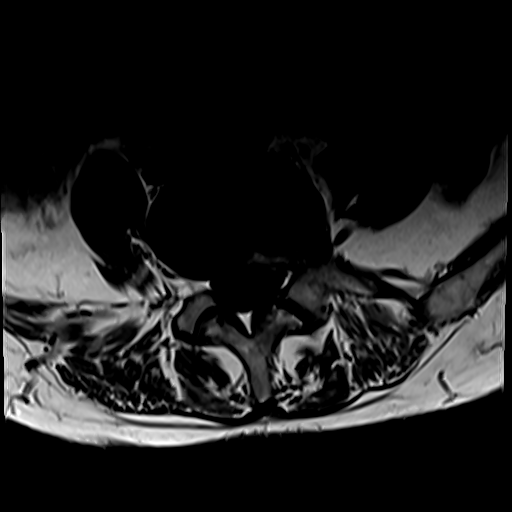
[im 16/34]
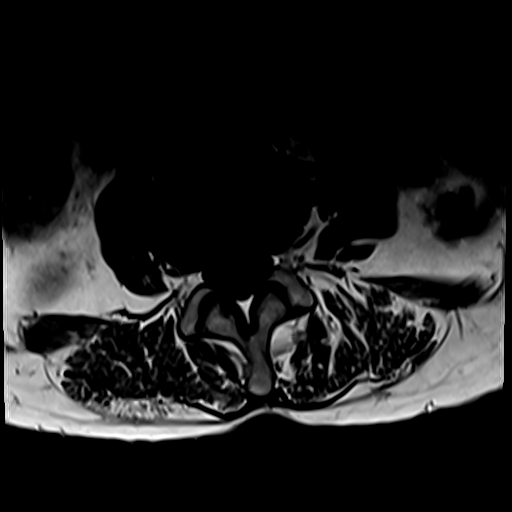
[im 18/34]
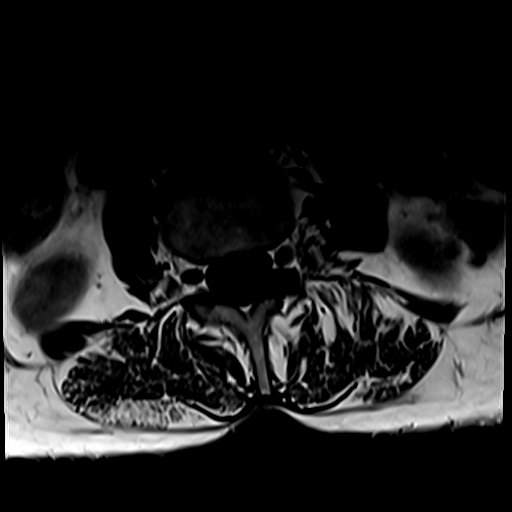
[im 23/34]
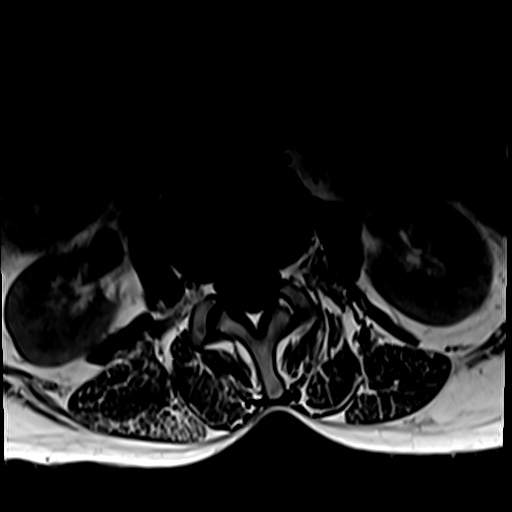
[im 28/34]
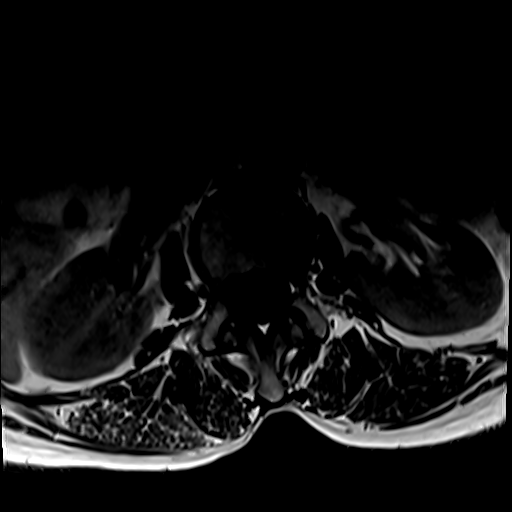
[im 34/34]
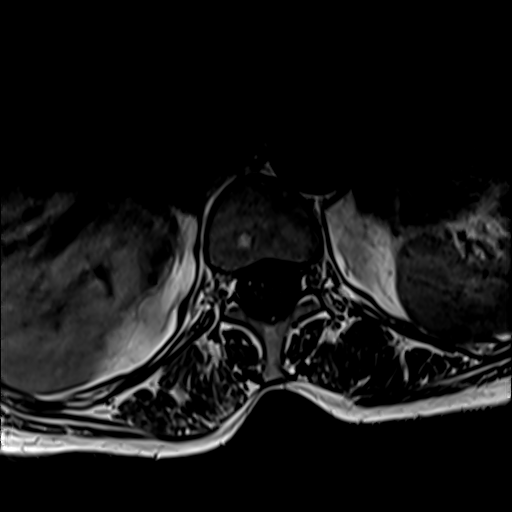

[31 of 48 positions shown; findings below may reference images not displayed]

FINDINGS: Segmentation:  Standard.

Alignment: Stable including anterolisthesis at L4-L5 and
dextrocurvature.

Vertebrae: Vertebral body heights are similar. Vertebral body
hemangiomas are present at L2 and S1. Degenerative endplate marrow
changes are present at L4-L5.

Conus medullaris and cauda equina: Conus extends to the L1 level.
Conus and cauda equina appear normal.

Paraspinal and other soft tissues: Unremarkable.

Disc levels:

L1-L2:  No canal or foraminal stenosis.

L2-L3: Disc bulge slightly eccentric to the right. Mild facet
arthropathy and ligamentum flavum infolding. No canal or foraminal
stenosis.

L3-L4: Disc bulge. Mild facet arthropathy and ligamentum flavum
infolding. No canal or foraminal stenosis.

L4-L5: Anterolisthesis with uncovering of disc bulge. Mild facet
arthropathy and ligamentum flavum infolding. No canal stenosis.
Slight effacement subarticular recesses. No right foraminal
stenosis. Mild left foraminal stenosis.

L5-S1: Disc bulge with endplate osteophytic ridging and superimposed
right foraminal/far lateral protrusion. No canal or left foraminal
stenosis. Moderate right foraminal stenosis with disc/osteophyte
potentially compressing the exiting nerve root.
IMPRESSION: Multilevel degenerative changes as detailed above similar to 4040
examination. No high-grade canal stenosis. Foraminal narrowing is
greatest on the right at L5-S1.

## 2023-05-16 ENCOUNTER — Other Ambulatory Visit: Payer: Self-pay | Admitting: Internal Medicine

## 2023-05-16 ENCOUNTER — Telehealth: Payer: Self-pay

## 2023-05-16 MED ORDER — WARFARIN SODIUM 5 MG PO TABS: 2.5000 mg | ORAL_TABLET | Freq: Every day | ORAL | 3 refills | Status: DC

## 2023-05-16 NOTE — Telephone Encounter (Signed)
Copied from CRM [id number]. Topic: Clinical - Medication Refill >> May 16, 2023 10:13 AM Antony Haste wrote: Most Recent Primary Care Visit:  Provider: Christel Mormon E  Department: [facility] CARE  Visit Type: OFFICE VISIT  Date: 03/04/2023  Medication: warfarin (COUMADIN) 5 MG tablet  Has the patient contacted their pharmacy? Yes (Agent: If no, request that the patient contact the pharmacy for the refill. If patient does not wish to contact the pharmacy document the reason why and proceed with request.) (Agent: If yes, when and what did the pharmacy advise?)  Is this the correct pharmacy for this prescription? Yes If no, delete pharmacy and type the correct one.  This is the patient's preferred pharmacy:  [facility] - [place], [place] - [address] [place] [address] Phone: [phone] Fax: [phone]   Has the prescription been filled recently? No  Is the patient out of the medication? Yes  Has the patient been seen for an appointment in the last year OR does the patient have an upcoming appointment? Yes  Can we respond through MyChart? Yes  Agent: Please be advised that Rx refills may take up to 3 business days. We ask that you follow-up with your pharmacy.

## 2023-05-16 NOTE — Telephone Encounter (Signed)
Refills sent to pharmacy. 

## 2023-05-23 ENCOUNTER — Ambulatory Visit: Payer: Medicare Other | Attending: Internal Medicine | Admitting: *Deleted

## 2023-05-23 DIAGNOSIS — I2609 Other pulmonary embolism with acute cor pulmonale: Secondary | ICD-10-CM | POA: Insufficient documentation

## 2023-05-23 DIAGNOSIS — Z5181 Encounter for therapeutic drug level monitoring: Secondary | ICD-10-CM | POA: Diagnosis not present

## 2023-05-23 DIAGNOSIS — I2782 Chronic pulmonary embolism: Secondary | ICD-10-CM | POA: Diagnosis not present

## 2023-05-23 LAB — POCT INR: INR: 2.4 (ref 2.0–3.0)

## 2023-05-23 NOTE — Patient Instructions (Signed)
 Continue warfarin 1/2 tablet daily Recheck in 6 wks

## 2023-06-05 ENCOUNTER — Ambulatory Visit (INDEPENDENT_AMBULATORY_CARE_PROVIDER_SITE_OTHER): Payer: Medicare Other | Admitting: Internal Medicine

## 2023-06-05 ENCOUNTER — Encounter: Payer: Self-pay | Admitting: Internal Medicine

## 2023-06-05 VITALS — BP 131/70 | HR 81 | Ht 66.0 in | Wt 109.6 lb

## 2023-06-05 DIAGNOSIS — E782 Mixed hyperlipidemia: Secondary | ICD-10-CM

## 2023-06-05 DIAGNOSIS — M057A Rheumatoid arthritis with rheumatoid factor of other specified site without organ or systems involvement: Secondary | ICD-10-CM

## 2023-06-05 DIAGNOSIS — I2694 Multiple subsegmental pulmonary emboli without acute cor pulmonale: Secondary | ICD-10-CM | POA: Diagnosis not present

## 2023-06-05 NOTE — Patient Instructions (Signed)
It was a pleasure to see you today.  Thank you for giving [place] the opportunity to be involved in your care.  Below is a brief recap of your visit and next steps.  We will plan to see you again in 3 months.  Summary No medication changes today We will plan for follow up and repeat labs in 3 months

## 2023-06-05 NOTE — Assessment & Plan Note (Signed)
Lipid panel updated in October.  Total cholesterol 224 and LDL 133.  Atorvastatin 20 mg daily was resumed in light of this result.  Repeat lipid panel at follow-up in 3 months.

## 2023-06-05 NOTE — Progress Notes (Signed)
Established Patient Office Visit  Subjective   Patient ID: Cheryl Swanson, female    DOB: [birth year]  Age: 89 y.o. MRN: [id number]  Chief Complaint  Patient presents with   Care Management    Three month follow up   Cheryl Swanson returns to care today for routine follow-up.  She was last evaluated by me in October 2024.  No medication changes were made at that time, repeat labs were ordered, and 35-month follow-up was arranged.  Atorvastatin was ultimately restarted due to a significant increase in LDL after stopping atorvastatin previously.  In the interim, she has been evaluated by ophthalmology and rheumatology for follow-up.  There have otherwise been no acute interval events.  Cheryl Swanson reports feeling fairly well today.  She endorses chronic musculoskeletal pain but is otherwise asymptomatic and has no acute concerns to discuss.  She reports that she has recently started Orencia infusions.  Past Medical History:  Diagnosis Date   Arthritis    COPD (chronic obstructive pulmonary disease) (HCC)    Glaucoma    Mitral valve disorder    Peptic ulcer    Rheumatoid arthritis(714.0)    Tricuspid valve disorder    Past Surgical History:  Procedure Laterality Date   ABDOMINAL HYSTERECTOMY  1971   HAND SURGERY  1980's   left   Social History   Tobacco Use   Smoking status: Never   Smokeless tobacco: Never  Vaping Use   Vaping status: Never Used  Substance Use Topics   Alcohol use: No   Drug use: No   Family History  Problem Relation Age of Onset   Cancer Mother    Cancer Father    Cancer Brother    Cancer Brother    Cancer Son        prostate-remission   No Known Allergies  Review of Systems  Musculoskeletal:  Positive for joint pain (Chronic, unchanged).  All other systems reviewed and are negative.     Objective:     BP 131/70 (BP Location: Left Arm, Patient Position: Sitting, Cuff Size: Normal)   Pulse 81   Ht 5\' 6"  (1.676 m)   Wt 109 lb 9.6 oz (49.7 kg)    SpO2 96%   BMI 17.69 kg/m  BP Readings from Last 3 Encounters:  06/05/23 131/70  03/04/23 (!) 148/72  01/23/23 134/60   Physical Exam Vitals reviewed.  Constitutional:      General: She is not in acute distress.    Appearance: Normal appearance. She is not toxic-appearing.  HENT:     Head: Normocephalic and atraumatic.     Right Ear: External ear normal.     Left Ear: External ear normal.     Nose: Nose normal. No congestion or rhinorrhea.     Mouth/Throat:     Mouth: Mucous membranes are moist.     Pharynx: Oropharynx is clear. No oropharyngeal exudate or posterior oropharyngeal erythema.  Eyes:     General: No scleral icterus.    Extraocular Movements: Extraocular movements intact.     Conjunctiva/sclera: Conjunctivae normal.     Pupils: Pupils are equal, round, and reactive to light.  Cardiovascular:     Rate and Rhythm: Normal rate and regular rhythm.     Pulses: Normal pulses.     Heart sounds: Normal heart sounds. No murmur heard.    No friction rub. No gallop.  Pulmonary:     Effort: Pulmonary effort is normal.     Breath sounds: Normal breath  sounds. No wheezing, rhonchi or rales.  Abdominal:     General: Abdomen is flat. Bowel sounds are normal. There is no distension.     Palpations: Abdomen is soft.     Tenderness: There is no abdominal tenderness.  Musculoskeletal:        General: Deformity (Ulnar deviation of both hands as well as nodular deformities along the MCPs of each hand) present. No swelling. Normal range of motion.     Cervical back: Normal range of motion.     Right lower leg: No edema.     Left lower leg: No edema.  Lymphadenopathy:     Cervical: No cervical adenopathy.  Skin:    General: Skin is warm and dry.     Capillary Refill: Capillary refill takes less than 2 seconds.     Coloration: Skin is not jaundiced.  Neurological:     General: No focal deficit present.     Mental Status: She is alert and oriented to person, place, and time.   Psychiatric:        Mood and Affect: Mood normal.        Behavior: Behavior normal.   Last CBC Lab Results  Component Value Date   WBC 3.9 03/04/2023   HGB 13.6 03/04/2023   HCT 42.3 03/04/2023   MCV 92 03/04/2023   MCH 29.4 03/04/2023   RDW 15.9 (H) 03/04/2023   PLT 353 03/04/2023   Last metabolic panel Lab Results  Component Value Date   GLUCOSE 84 03/04/2023   NA 140 03/04/2023   K 4.1 03/04/2023   CL 104 03/04/2023   CO2 24 03/04/2023   BUN 10 03/04/2023   CREATININE 0.72 03/04/2023   EGFR 80 03/04/2023   CALCIUM 10.1 03/04/2023   PHOS 2.2 (L) 09/25/2022   PROT 7.0 03/04/2023   ALBUMIN 4.0 03/04/2023   LABGLOB 3.0 03/04/2023   BILITOT 0.9 03/04/2023   ALKPHOS 84 03/04/2023   AST 20 03/04/2023   ALT 6 03/04/2023   ANIONGAP 7 09/25/2022   Last lipids Lab Results  Component Value Date   CHOL 224 (H) 03/04/2023   HDL 78 03/04/2023   LDLCALC 133 (H) 03/04/2023   TRIG 76 03/04/2023   CHOLHDL 2.9 03/04/2023   Last thyroid functions Lab Results  Component Value Date   TSH 2.680 03/04/2023   Last vitamin D Lab Results  Component Value Date   VD25OH 42.4 03/04/2023   Last vitamin B12 and Folate Lab Results  Component Value Date   VITAMINB12 727 03/04/2023   FOLATE >20.0 03/04/2023     Assessment & Plan:   Problem List Items Addressed This Visit       Pulmonary embolism (HCC) - Primary   She has establish care with the Coumadin clinic for warfarin management.  INR therapeutic when assessed earlier this month.  No recent change in warfarin dose.      Rheumatoid arthritis (HCC)   Followed by [facility] Rheumatology.  Currently prescribed methotrexate and reports that Orencia infusions were recently added.      Hyperlipidemia   Lipid panel updated in October.  Total cholesterol 224 and LDL 133.  Atorvastatin 20 mg daily was resumed in light of this result.  Repeat lipid panel at follow-up in 3 months.       Return in about 3 months (around  09/03/2023).   Billie Lade, MD

## 2023-06-05 NOTE — Assessment & Plan Note (Signed)
She has establish care with the Coumadin clinic for warfarin management.  INR therapeutic when assessed earlier this month.  No recent change in warfarin dose.

## 2023-06-05 NOTE — Assessment & Plan Note (Signed)
Followed by [facility] Rheumatology.  Currently prescribed methotrexate and reports that Orencia infusions were recently added.

## 2023-07-03 ENCOUNTER — Ambulatory Visit: Payer: Medicare Other | Attending: Internal Medicine | Admitting: *Deleted

## 2023-07-03 DIAGNOSIS — Z5181 Encounter for therapeutic drug level monitoring: Secondary | ICD-10-CM

## 2023-07-03 DIAGNOSIS — I824Y1 Acute embolism and thrombosis of unspecified deep veins of right proximal lower extremity: Secondary | ICD-10-CM | POA: Diagnosis present

## 2023-07-03 DIAGNOSIS — I2782 Chronic pulmonary embolism: Secondary | ICD-10-CM

## 2023-07-03 DIAGNOSIS — R791 Abnormal coagulation profile: Secondary | ICD-10-CM | POA: Insufficient documentation

## 2023-07-03 DIAGNOSIS — I2609 Other pulmonary embolism with acute cor pulmonale: Secondary | ICD-10-CM

## 2023-07-03 LAB — POCT INR: INR: 2.8 (ref 2.0–3.0)

## 2023-07-03 NOTE — Patient Instructions (Signed)
Continue warfarin 1/2 tablet daily Recheck in 6 wks

## 2023-08-04 ENCOUNTER — Other Ambulatory Visit: Payer: Self-pay

## 2023-08-04 ENCOUNTER — Telehealth: Payer: Self-pay | Admitting: Internal Medicine

## 2023-08-04 MED ORDER — POTASSIUM CHLORIDE CRYS ER 20 MEQ PO TBCR: 20.0000 meq | EXTENDED_RELEASE_TABLET | Freq: Every day | ORAL | 0 refills | Status: DC

## 2023-08-04 NOTE — Telephone Encounter (Signed)
 Refill sent to pharmacy.

## 2023-08-04 NOTE — Telephone Encounter (Signed)
 Prescription Request  08/04/2023  LOV: 06/05/2023  What is the name of the medication or equipment? potassium chloride SA (KLOR-CON M) 20 MEQ tablet [[id number]]    Have you contacted your pharmacy to request a refill? Yes   Which pharmacy would you like this sent to?  [facility] - [place], [place] - [address] [place] [employer] [place] [address] Phone: [phone] Fax: [phone]    Patient notified that their request is being sent to the clinical staff for review and that they should receive a response within 2 business days.   Please advise at [facility] [phone]

## 2023-08-18 ENCOUNTER — Ambulatory Visit: Attending: Internal Medicine | Admitting: *Deleted

## 2023-08-18 DIAGNOSIS — I2609 Other pulmonary embolism with acute cor pulmonale: Secondary | ICD-10-CM | POA: Insufficient documentation

## 2023-08-18 DIAGNOSIS — Z5181 Encounter for therapeutic drug level monitoring: Secondary | ICD-10-CM | POA: Insufficient documentation

## 2023-08-18 DIAGNOSIS — I2782 Chronic pulmonary embolism: Secondary | ICD-10-CM | POA: Insufficient documentation

## 2023-08-18 LAB — POCT INR: INR: 1.5 — AB (ref 2.0–3.0)

## 2023-08-18 NOTE — Patient Instructions (Signed)
 Take warfarin 1 tablet tonight and tomorrow night then resume 1/2 tablet daily Recheck in 3 wks

## 2023-09-03 ENCOUNTER — Ambulatory Visit (INDEPENDENT_AMBULATORY_CARE_PROVIDER_SITE_OTHER): Payer: Medicare Other | Admitting: Internal Medicine

## 2023-09-03 ENCOUNTER — Encounter: Payer: Self-pay | Admitting: Internal Medicine

## 2023-09-03 VITALS — BP 135/82 | HR 76 | Ht 66.0 in | Wt 109.6 lb

## 2023-09-03 DIAGNOSIS — M057A Rheumatoid arthritis with rheumatoid factor of other specified site without organ or systems involvement: Secondary | ICD-10-CM | POA: Diagnosis not present

## 2023-09-03 DIAGNOSIS — H409 Unspecified glaucoma: Secondary | ICD-10-CM | POA: Diagnosis not present

## 2023-09-03 DIAGNOSIS — E782 Mixed hyperlipidemia: Secondary | ICD-10-CM | POA: Diagnosis not present

## 2023-09-03 DIAGNOSIS — I2694 Multiple subsegmental pulmonary emboli without acute cor pulmonale: Secondary | ICD-10-CM | POA: Diagnosis not present

## 2023-09-03 NOTE — Patient Instructions (Signed)
 It was a pleasure to see you today.  Thank you for giving us  the opportunity to be involved in your care.  Below is a brief recap of your visit and next steps.  We will plan to see you again in 3 months.  Summary No medication changes today Repeat cholesterol panel ordered Follow up in 3 months

## 2023-09-03 NOTE — Assessment & Plan Note (Addendum)
 Closely followed by coumadin clinic. Remains on Warfarin.

## 2023-09-03 NOTE — Assessment & Plan Note (Signed)
 Symptoms are stable.  She remains on methotrexate and Orencia infusions.  Recently seen by rheumatology for follow-up.

## 2023-09-03 NOTE — Progress Notes (Signed)
 Established Patient Office Visit  Subjective   Patient ID: Cheryl Swanson, female    DOB: [birth year]  Age: 89 y.o. MRN: [id number]  Chief Complaint  Patient presents with   Care Management    Three month follow up   Cheryl Swanson returns today for routine follow-up.  She was last evaluated by me on 1/16.  No medication changes were made at that time and 96-month follow-up was arranged.  She has been seen by ophthalmology and rheumatology in the interim.  There have otherwise been no acute interval events.  Cheryl Swanson reports feeling well today and has no acute concerns to discuss.  Past Medical History:  Diagnosis Date   Arthritis    COPD (chronic obstructive pulmonary disease) (HCC)    Glaucoma    Mitral valve disorder    Peptic ulcer    Rheumatoid arthritis(714.0)    Tricuspid valve disorder    Past Surgical History:  Procedure Laterality Date   ABDOMINAL HYSTERECTOMY  1971   HAND SURGERY  1980's   left   Social History   Tobacco Use   Smoking status: Never   Smokeless tobacco: Never  Vaping Use   Vaping status: Never Used  Substance Use Topics   Alcohol use: No   Drug use: No   Family History  Problem Relation Age of Onset   Cancer Mother    Cancer Father    Cancer Brother    Cancer Brother    Cancer Son        prostate-remission   No Known Allergies  Review of Systems  Constitutional:  Negative for chills and fever.  HENT:  Negative for sore throat.   Respiratory:  Negative for cough and shortness of breath.   Cardiovascular:  Negative for chest pain, palpitations and leg swelling.  Gastrointestinal:  Negative for abdominal pain, blood in stool, constipation, diarrhea, nausea and vomiting.  Genitourinary:  Negative for dysuria and hematuria.  Musculoskeletal:  Negative for myalgias.  Skin:  Negative for itching and rash.  Neurological:  Negative for dizziness and headaches.  Psychiatric/Behavioral:  Negative for depression and suicidal ideas.       Objective:     BP 135/82   Pulse 76   Ht 5\' 6"  (1.676 m)   Wt 109 lb 9.6 oz (49.7 kg)   SpO2 96%   BMI 17.69 kg/m  BP Readings from Last 3 Encounters:  09/03/23 135/82  06/05/23 131/70  03/04/23 (!) 148/72   Physical Exam Vitals reviewed.  Constitutional:      General: She is not in acute distress.    Appearance: Normal appearance. She is not toxic-appearing.  HENT:     Head: Normocephalic and atraumatic.     Right Ear: External ear normal.     Left Ear: External ear normal.     Nose: Nose normal. No congestion or rhinorrhea.     Mouth/Throat:     Mouth: Mucous membranes are moist.     Pharynx: Oropharynx is clear. No oropharyngeal exudate or posterior oropharyngeal erythema.  Eyes:     General: No scleral icterus.    Extraocular Movements: Extraocular movements intact.     Conjunctiva/sclera: Conjunctivae normal.     Pupils: Pupils are equal, round, and reactive to light.  Cardiovascular:     Rate and Rhythm: Normal rate and regular rhythm.     Pulses: Normal pulses.     Heart sounds: Normal heart sounds. No murmur heard.    No friction rub. No  gallop.  Pulmonary:     Effort: Pulmonary effort is normal.     Breath sounds: Normal breath sounds. No wheezing, rhonchi or rales.  Abdominal:     General: Abdomen is flat. Bowel sounds are normal. There is no distension.     Palpations: Abdomen is soft.     Tenderness: There is no abdominal tenderness.  Musculoskeletal:        General: Deformity (Ulnar deviation of both hands as well as nodular deformities along the MCPs of each hand) present. No swelling. Normal range of motion.     Cervical back: Normal range of motion.     Right lower leg: No edema.     Left lower leg: No edema.  Lymphadenopathy:     Cervical: No cervical adenopathy.  Skin:    General: Skin is warm and dry.     Capillary Refill: Capillary refill takes less than 2 seconds.     Coloration: Skin is not jaundiced.     Findings: Lesion (soft,  non-tender lesion on posterior neck) present.  Neurological:     General: No focal deficit present.     Mental Status: She is alert and oriented to person, place, and time.  Psychiatric:        Mood and Affect: Mood normal.        Behavior: Behavior normal.   Last CBC Lab Results  Component Value Date   WBC 3.9 03/04/2023   HGB 13.6 03/04/2023   HCT 42.3 03/04/2023   MCV 92 03/04/2023   MCH 29.4 03/04/2023   RDW 15.9 (H) 03/04/2023   PLT 353 03/04/2023   Last metabolic panel Lab Results  Component Value Date   GLUCOSE 84 03/04/2023   NA 140 03/04/2023   K 4.1 03/04/2023   CL 104 03/04/2023   CO2 24 03/04/2023   BUN 10 03/04/2023   CREATININE 0.72 03/04/2023   EGFR 80 03/04/2023   CALCIUM 10.1 03/04/2023   PHOS 2.2 (L) 09/25/2022   PROT 7.0 03/04/2023   ALBUMIN 4.0 03/04/2023   LABGLOB 3.0 03/04/2023   BILITOT 0.9 03/04/2023   ALKPHOS 84 03/04/2023   AST 20 03/04/2023   ALT 6 03/04/2023   ANIONGAP 7 09/25/2022   Last lipids Lab Results  Component Value Date   CHOL 224 (H) 03/04/2023   HDL 78 03/04/2023   LDLCALC 133 (H) 03/04/2023   TRIG 76 03/04/2023   CHOLHDL 2.9 03/04/2023   Last thyroid functions Lab Results  Component Value Date   TSH 2.680 03/04/2023   Last vitamin D Lab Results  Component Value Date   VD25OH 42.4 03/04/2023   Last vitamin B12 and Folate Lab Results  Component Value Date   VITAMINB12 727 03/04/2023   FOLATE >20.0 03/04/2023     Assessment & Plan:   Problem List Items Addressed This Visit       Pulmonary embolism (HCC)   Closely followed by coumadin clinic. Remains on Warfarin.       Rheumatoid arthritis (HCC)   Symptoms are stable.  She remains on methotrexate and Orencia infusions.  Recently seen by rheumatology for follow-up.      Hyperlipidemia - Primary   Lipid panel last updated in October 2024.  Total cholesterol 224 and LDL 133.  Atorvastatin 20 mg daily was resumed in light of this result.  Repeat lipid  panel ordered today.      Glaucoma   Closely follow by ophthalmology.  Recently seen for follow-up.  Remains on Cosopt and  latanoprost ophthalmic drops.       Return in about 3 months (around 12/03/2023).    Tobi Fortes, MD

## 2023-09-03 NOTE — Assessment & Plan Note (Signed)
 Closely follow by ophthalmology.  Recently seen for follow-up.  Remains on Cosopt and latanoprost ophthalmic drops.

## 2023-09-03 NOTE — Assessment & Plan Note (Signed)
 Lipid panel last updated in October 2024.  Total cholesterol 224 and LDL 133.  Atorvastatin 20 mg daily was resumed in light of this result.  Repeat lipid panel ordered today.

## 2023-09-04 LAB — LIPID PANEL
Chol/HDL Ratio: 2.1 ratio (ref 0.0–4.4)
Cholesterol, Total: 157 mg/dL (ref 100–199)
HDL: 76 mg/dL (ref >39)
LDL Chol Calc (NIH): 70 mg/dL (ref 0–99)
Triglycerides: 50 mg/dL (ref 0–149)
VLDL Cholesterol Cal: 11 mg/dL (ref 5–40)

## 2023-09-08 ENCOUNTER — Other Ambulatory Visit: Payer: Self-pay | Admitting: Internal Medicine

## 2023-09-08 ENCOUNTER — Ambulatory Visit: Attending: Internal Medicine | Admitting: *Deleted

## 2023-09-08 DIAGNOSIS — Z5181 Encounter for therapeutic drug level monitoring: Secondary | ICD-10-CM | POA: Insufficient documentation

## 2023-09-08 DIAGNOSIS — I2609 Other pulmonary embolism with acute cor pulmonale: Secondary | ICD-10-CM | POA: Insufficient documentation

## 2023-09-08 DIAGNOSIS — I2782 Chronic pulmonary embolism: Secondary | ICD-10-CM | POA: Diagnosis present

## 2023-09-08 LAB — POCT INR: INR: 2.2 (ref 2.0–3.0)

## 2023-09-08 MED ORDER — ATORVASTATIN CALCIUM 20 MG PO TABS: 20.0000 mg | ORAL_TABLET | Freq: Every day | ORAL | 3 refills | Status: AC

## 2023-09-08 MED ORDER — POTASSIUM CHLORIDE CRYS ER 20 MEQ PO TBCR: 20.0000 meq | EXTENDED_RELEASE_TABLET | Freq: Every day | ORAL | 0 refills | Status: DC

## 2023-09-08 NOTE — Telephone Encounter (Signed)
 Copied from CRM [id number]. Topic: Clinical - Medication Refill >> Sep 08, 2023  9:08 AM Dimple Francis wrote: Most Recent Primary Care Visit:  Provider: DIXON, PHILLIP E  Department: [facility] C[facility]  Visit Type: OFFICE VISIT  Date: 09/03/2023  Medication: potassium chloride  SA (KLOR-CON  M) 20 MEQ tablet ; atorvastatin  (LIPITOR) 20 MG tablet  Has the patient contacted their pharmacy? Yes (Agent: If no, request that the patient contact the pharmacy for the refill. If patient does not wish to contact the pharmacy document the reason why and proceed with request.) (Agent: If yes, when and what did the pharmacy advise?)  Is this the correct pharmacy for this prescription? Yes If no, delete pharmacy and type the correct one.  This is the patient's preferred pharmacy:  [facility] [facility] - [place], [place] - [address] [place] [address] Phone: [phone] Fax: [phone]   Has the prescription been filled recently? Yes  Is the patient out of the medication? Yes  Has the patient been seen for an appointment in the last year OR does the patient have an upcoming appointment? Yes  Can we respond through MyChart? Yes  Agent: Please be advised that Rx refills may take up to 3 business days. We ask that you follow-up with your pharmacy.

## 2023-09-08 NOTE — Patient Instructions (Signed)
 Continue warfarin 1/2 tablet daily Recheck in 4 wks

## 2023-10-06 ENCOUNTER — Ambulatory Visit: Attending: Internal Medicine | Admitting: *Deleted

## 2023-10-06 DIAGNOSIS — Z5181 Encounter for therapeutic drug level monitoring: Secondary | ICD-10-CM | POA: Diagnosis present

## 2023-10-06 DIAGNOSIS — I2609 Other pulmonary embolism with acute cor pulmonale: Secondary | ICD-10-CM

## 2023-10-06 DIAGNOSIS — I2782 Chronic pulmonary embolism: Secondary | ICD-10-CM | POA: Insufficient documentation

## 2023-10-06 LAB — POCT INR: INR: 2.2 (ref 2.0–3.0)

## 2023-10-06 NOTE — Patient Instructions (Signed)
 Continue warfarin 1/2 tablet daily Recheck in 4 wks

## 2023-10-08 ENCOUNTER — Other Ambulatory Visit: Payer: Self-pay | Admitting: Internal Medicine

## 2023-10-08 MED ORDER — POTASSIUM CHLORIDE CRYS ER 20 MEQ PO TBCR: 20.0000 meq | EXTENDED_RELEASE_TABLET | Freq: Every day | ORAL | 0 refills | Status: DC

## 2023-10-08 NOTE — Telephone Encounter (Signed)
 Copied from CRM [id number]. Topic: Clinical - Medication Refill >> Oct 08, 2023  9:32 AM Cynthia K wrote: Medication: potassium chloride  SA (KLOR-CON  M) 20 MEQ tablet   Has the patient contacted their pharmacy? Yes (Agent: If no, request that the patient contact the pharmacy for the refill. If patient does not wish to contact the pharmacy document the reason why and proceed with request.) (Agent: If yes, when and what did the pharmacy advise?) Pharmacy needs order to refill  This is the patient's preferred pharmacy:  [facility] - [place], [place] - [address] [place] [address] Phone: [phone] Fax: [phone]  Is this the correct pharmacy for this prescription? Yes If no, delete pharmacy and type the correct one.   Has the prescription been filled recently? No  Is the patient out of the medication? Yes - She took last one today  Has the patient been seen for an appointment in the last year OR does the patient have an upcoming appointment? Yes  Can we respond through MyChart? No  Agent: Please be advised that Rx refills may take up to 3 business days. We ask that you follow-up with your pharmacy.

## 2023-10-08 NOTE — Telephone Encounter (Signed)
 Last Fill: 09/08/23  Last OV: 09/03/23 Next OV: 12/03/23  Routing to provider for review/authorization.

## 2023-10-30 ENCOUNTER — Other Ambulatory Visit: Payer: Self-pay | Admitting: Internal Medicine

## 2023-10-30 NOTE — Telephone Encounter (Signed)
 Last Fill: 05/16/23  Last OV: 09/03/23 Next OV: 12/03/23  Routing to provider for review/authorization.

## 2023-10-30 NOTE — Telephone Encounter (Signed)
 Copied from CRM [id number]. Topic: Clinical - Medication Refill >> Oct 30, 2023  9:41 AM Jakyia R wrote: Medication:  warfarin (COUMADIN ) 5 MG tablet   Has the patient contacted their pharmacy? Yes, no refills  (Agent: If no, request that the patient contact the pharmacy for the refill. If patient does not wish to contact the pharmacy document the reason why and proceed with request.) (Agent: If yes, when and what did the pharmacy advise?)  This is the patient's preferred pharmacy:  [facility] - [place], [place] - [address] [place] [address] Phone: [phone] Fax: [phone]  Is this the correct pharmacy for this prescription? Yes If no, delete pharmacy and type the correct one.   Has the prescription been filled recently? Yes  Is the patient out of the medication? Yes  Has the patient been seen for an appointment in the last year OR does the patient have an upcoming appointment? Yes  Can we respond through MyChart? No  Agent: Please be advised that Rx refills may take up to 3 business days. We ask that you follow-up with your pharmacy.

## 2023-11-07 ENCOUNTER — Telehealth: Payer: Self-pay | Admitting: Internal Medicine

## 2023-11-07 NOTE — Telephone Encounter (Signed)
 Copied from CRM [id number]. Topic: Clinical - Prescription Issue >> Nov 07, 2023 11:07 AM Stanly Early wrote: Reason for CRM: potassium chloride  SA (KLOR-CON  M) 20 MEQ tablet [[id number]] can't be refilled and her next available appt is not until July. Her last provider left the practice and she stated she gets the medication filled every month. Please call patient back [phone]

## 2023-11-09 ENCOUNTER — Other Ambulatory Visit: Payer: Self-pay

## 2023-11-09 MED ORDER — POTASSIUM CHLORIDE CRYS ER 20 MEQ PO TBCR: 20.0000 meq | EXTENDED_RELEASE_TABLET | Freq: Every day | ORAL | 2 refills | Status: DC

## 2023-11-10 ENCOUNTER — Ambulatory Visit: Attending: Internal Medicine | Admitting: *Deleted

## 2023-11-10 DIAGNOSIS — Z5181 Encounter for therapeutic drug level monitoring: Secondary | ICD-10-CM | POA: Insufficient documentation

## 2023-11-10 DIAGNOSIS — I2609 Other pulmonary embolism with acute cor pulmonale: Secondary | ICD-10-CM | POA: Diagnosis present

## 2023-11-10 DIAGNOSIS — I2782 Chronic pulmonary embolism: Secondary | ICD-10-CM | POA: Diagnosis present

## 2023-11-10 LAB — POCT INR: INR: 2.5 (ref 2.0–3.0)

## 2023-11-10 NOTE — Patient Instructions (Signed)
 Continue warfarin 1/2 tablet daily Recheck in 6 wks

## 2023-12-03 ENCOUNTER — Ambulatory Visit

## 2023-12-03 VITALS — BP 144/77 | HR 71 | Ht 66.0 in | Wt 111.0 lb

## 2023-12-03 DIAGNOSIS — M057A Rheumatoid arthritis with rheumatoid factor of other specified site without organ or systems involvement: Secondary | ICD-10-CM

## 2023-12-03 MED ORDER — METHOTREXATE SODIUM 2.5 MG PO TABS: 10.0000 mg | ORAL_TABLET | ORAL | 0 refills | Status: AC

## 2023-12-03 NOTE — Progress Notes (Signed)
 Established Patient Office Visit  Subjective   Patient ID: Cheryl Swanson, female    DOB: 1931/12/15  Age: 89 y.o. MRN: [id number]  Chief Complaint  Patient presents with   Medical Management of Chronic Issues    Follow up    HPI  Patient Active Problem List   Diagnosis Date Noted   Need for influenza vaccination 04/06/2023   Encounter for therapeutic drug monitoring 01/29/2023   DVT, lower extremity, proximal, acute, right (HCC) 01/29/2023   Hyperlipidemia 12/03/2022   Glaucoma 12/03/2022   History of COPD 12/03/2022   Small bowel obstruction (HCC) 09/23/2022   Supratherapeutic INR 09/23/2022   Pulmonary embolism (HCC) 08/16/2019   Fatigue 10/29/2012   Rheumatoid arthritis (HCC) 10/29/2012      ROS    Objective:     BP (!) 144/77   Pulse 71   Ht 5' 6 (1.676 m)   Wt 111 lb (50.3 kg)   SpO2 95%   BMI 17.92 kg/m  BP Readings from Last 3 Encounters:  12/03/23 (!) 144/77  09/03/23 135/82  06/05/23 131/70   Wt Readings from Last 3 Encounters:  12/03/23 111 lb (50.3 kg)  09/03/23 109 lb 9.6 oz (49.7 kg)  06/05/23 109 lb 9.6 oz (49.7 kg)     Physical Exam Vitals and nursing note reviewed.  Constitutional:      Appearance: Normal appearance.  HENT:     Head: Normocephalic.  Cardiovascular:     Rate and Rhythm: Normal rate and regular rhythm.  Pulmonary:     Effort: Pulmonary effort is normal.     Breath sounds: Normal breath sounds.  Musculoskeletal:     Cervical back: Normal range of motion and neck supple.  Neurological:     Mental Status: She is alert and oriented to person, place, and time.  Psychiatric:        Mood and Affect: Mood normal.        Thought Content: Thought content normal.      No results found for any visits on 12/03/23.  Last CBC Lab Results  Component Value Date   WBC 3.9 03/04/2023   HGB 13.6 03/04/2023   HCT 42.3 03/04/2023   MCV 92 03/04/2023   MCH 29.4 03/04/2023   RDW 15.9 (H) 03/04/2023   PLT 353 03/04/2023    Last metabolic panel Lab Results  Component Value Date   GLUCOSE 84 03/04/2023   NA 140 03/04/2023   K 4.1 03/04/2023   CL 104 03/04/2023   CO2 24 03/04/2023   BUN 10 03/04/2023   CREATININE 0.72 03/04/2023   EGFR 80 03/04/2023   CALCIUM  10.1 03/04/2023   PHOS 2.2 (L) 09/25/2022   PROT 7.0 03/04/2023   ALBUMIN 4.0 03/04/2023   LABGLOB 3.0 03/04/2023   BILITOT 0.9 03/04/2023   ALKPHOS 84 03/04/2023   AST 20 03/04/2023   ALT 6 03/04/2023   ANIONGAP 7 09/25/2022   Last lipids Lab Results  Component Value Date   CHOL 157 09/03/2023   HDL 76 09/03/2023   LDLCALC 70 09/03/2023   TRIG 50 09/03/2023   CHOLHDL 2.1 09/03/2023   Last hemoglobin A1c No results found for: HGBA1C    The ASCVD Risk score (Arnett DK, et al., 2019) failed to calculate for the following reasons:   The 2019 ASCVD risk score is only valid for ages 3 to 77    Assessment & Plan:   Problem List Items Addressed This Visit       Musculoskeletal  and Integument   Rheumatoid arthritis (HCC) - Primary   She reports being out of her methotrexate  for a few weeks.  She has an upcoming appointment with rheumatology next week.  Agree to provide a temporary refill until she can be seen by them next week.        Relevant Medications   methotrexate  (RHEUMATREX) 2.5 MG tablet    Return in about 4 months (around 04/04/2024) for chronic follow-up with PCP.    Leita Longs, FNP

## 2023-12-04 ENCOUNTER — Telehealth: Payer: Self-pay

## 2023-12-04 NOTE — Telephone Encounter (Signed)
 Copied from CRM [id number]. Topic: Clinical - Prescription Issue >> Dec 03, 2023  3:59 PM Emylou G wrote: Reason for CRM: [facility].. said Needs to update quantity - methotraxate it was incorrect?  4 tabs once a week.. only sent quantity of 4.. pls resend script.. [phone].SABRA

## 2023-12-05 NOTE — Telephone Encounter (Signed)
 Pharmacy stated they will dispense it

## 2023-12-10 NOTE — Assessment & Plan Note (Signed)
 She reports being out of her methotrexate  for a few weeks.  She has an upcoming appointment with rheumatology next week.  Agree to provide a temporary refill until she can be seen by them next week.

## 2023-12-22 ENCOUNTER — Ambulatory Visit: Attending: Internal Medicine | Admitting: *Deleted

## 2023-12-22 DIAGNOSIS — Z5181 Encounter for therapeutic drug level monitoring: Secondary | ICD-10-CM | POA: Insufficient documentation

## 2023-12-22 DIAGNOSIS — I2782 Chronic pulmonary embolism: Secondary | ICD-10-CM | POA: Diagnosis not present

## 2023-12-22 DIAGNOSIS — I2609 Other pulmonary embolism with acute cor pulmonale: Secondary | ICD-10-CM | POA: Insufficient documentation

## 2023-12-22 LAB — POCT INR: INR: 1.8 — AB (ref 2.0–3.0)

## 2023-12-22 NOTE — Patient Instructions (Signed)
 Take warfarin 1 tablet tonight then resume 1/2 tablet daily Recheck in 6 wks

## 2023-12-29 ENCOUNTER — Ambulatory Visit: Payer: Self-pay

## 2023-12-29 NOTE — Telephone Encounter (Signed)
 FYI Only or Action Required?: FYI only for provider.  Patient was last seen in primary care on 12/03/2023 by Bevely Doffing, FNP.  Called Nurse Triage reporting Arthritis.  Symptoms began yesterday.  Interventions attempted: salt water gargle  Symptoms are: unchanged.  Triage Disposition: Home Care  Patient/caregiver understands and will follow disposition?: Yes      Copied from CRM [id number]. Topic: Clinical - Red Word Triage >> Dec 29, 2023  1:01 PM Gustabo D wrote: Patient is having pain all over from head to toe and not feeling good- says arthritis. Throat scratching  and head hurts. Reason for Disposition  [1] Probable mild influenza (no fever) or a common cold, with no complications AND [2] NOT HIGH RISK  Answer Assessment - Initial Assessment Questions 1. SYMPTOMS: What is your main symptom or concern? (e.g., cough, fever, shortness of breath, muscle aches)     Body aches, cough, scratchy throat 2. ONSET: When did the symptoms start?      yesterday 3. COUGH: Do you have a cough? If Yes, ask: How bad is the cough?       Pretty bad at times 4. FEVER: Do you have a fever? If Yes, ask: What is your temperature, how was it measured, and when did it start?     no 5. BREATHING DIFFICULTY: Are you having any difficulty breathing? (e.g., normal; shortness of breath, wheezing, unable to speak)      no 6. BETTER-SAME-WORSE: Are you getting better, staying the same or getting worse compared to yesterday?  If getting worse, ask, In what way?     worse 7. OTHER SYMPTOMS: Do you have any other symptoms?  (e.g., chills, fatigue, headache, loss of smell or taste, muscle pain, sore throat)     Headache, scratchy throat 8. INFLUENZA EXPOSURE: Was there any known exposure to influenza (flu) before the symptoms began?      no 9. INFLUENZA SUSPECTED: Why do you think you have influenza? (e.g., positive flu self-test at home, symptoms after exposure).     no 10.  INFLUENZA VACCINE: Have you had the flu vaccine? If Yes, ask: When did you last get it?       Yes-  11. HIGH RISK FOR COMPLICATIONS: Do you have any chronic medical problems? (e.g., asthma, heart or lung disease, obesity, weak immune system)       copd  Answer Assessment - Initial Assessment Questions 1. ONSET: When did the muscle aches or body pains start?      yesterday 2. LOCATION: What part of your body is hurting? (e.g., entire body, arms, legs)      All over and sore throat 3. SEVERITY: How bad is the pain? (Scale 1-10; or mild, moderate, severe)     7/10 4. CAUSE: What do you think is causing the pains?     arthritis 5. FEVER: Do you have a fever? If Yes, ask: What is your temperature, how was it measured, and  when did it start?      no 6. OTHER SYMPTOMS: Do you have any other symptoms? (e.g., chest pain, cold or flu symptoms, rash, weakness, weight loss)     Cough, stretchy throat, sneezing  Protocols used: Muscle Aches and Body Pain-A-AH, Influenza (Flu) Suspected-A-AH

## 2023-12-29 NOTE — Progress Notes (Signed)
 INR 1.8. Please see anticoagulation encounter

## 2023-12-31 ENCOUNTER — Ambulatory Visit (INDEPENDENT_AMBULATORY_CARE_PROVIDER_SITE_OTHER): Payer: Self-pay | Admitting: Internal Medicine

## 2023-12-31 ENCOUNTER — Encounter: Payer: Self-pay | Admitting: Internal Medicine

## 2023-12-31 VITALS — BP 138/70 | HR 90 | Ht 66.0 in | Wt 112.4 lb

## 2023-12-31 DIAGNOSIS — J209 Acute bronchitis, unspecified: Secondary | ICD-10-CM | POA: Diagnosis not present

## 2023-12-31 DIAGNOSIS — I2694 Multiple subsegmental pulmonary emboli without acute cor pulmonale: Secondary | ICD-10-CM

## 2023-12-31 DIAGNOSIS — M057A Rheumatoid arthritis with rheumatoid factor of other specified site without organ or systems involvement: Secondary | ICD-10-CM

## 2023-12-31 DIAGNOSIS — R5382 Chronic fatigue, unspecified: Secondary | ICD-10-CM | POA: Diagnosis not present

## 2023-12-31 MED ORDER — METHYLPREDNISOLONE 4 MG PO TBPK
ORAL_TABLET | ORAL | 0 refills | Status: DC
Start: 2023-12-31 — End: 2024-04-05

## 2023-12-31 NOTE — Assessment & Plan Note (Signed)
 Considering cough and mild dyspnea, started Medrol  Dosepak, can also help with myalgias and arthralgias Check flu, COVID and RSV testing Advised to maintain adequate hydration Robitussin as needed for cough

## 2023-12-31 NOTE — Assessment & Plan Note (Signed)
 Current worsening of fatigue could be due to viral infection/acute bronchitis Likely multifactorial, due to OA, advanced age and hydration Needs to maintain at least 50 ounces of fluid intake in a day Continue potassium supplement Recent CBC and CMP reviewed, overall unremarkable Would consider checking TSH in next blood tests

## 2023-12-31 NOTE — Progress Notes (Signed)
 Acute Office Visit  Subjective:    Patient ID: Cheryl Swanson, female    DOB: 11-06-1931, 89 y.o.   MRN: [id number]  Chief Complaint  Patient presents with   Cough    Pt reports sx of cough and body aches. Ongoing since Sunday.     HPI Patient is in today for complaint of cough, nasal congestion and fatigue for the last 3 days.  She has felt mild dyspnea, but denies any wheezing.  She has myalgias, which has been improving today.  She has tried taking Robitussin for cough with adequate relief.  Denies any recent sick contacts.  She reports chronic fatigue.  Of note, she has history of RA and takes methotrexate  for it.  She has diffuse arthralgias.  Her last CBC and CMP in 06/25 were overall unremarkable.  She takes Coumadin  for history of PE, has INR checked every month.  Does not report any signs of bleeding such as melena or hematochezia.  Past Medical History:  Diagnosis Date   Arthritis    COPD (chronic obstructive pulmonary disease) (HCC)    Glaucoma    Mitral valve disorder    Peptic ulcer    Rheumatoid arthritis(714.0)    Tricuspid valve disorder     Past Surgical History:  Procedure Laterality Date   ABDOMINAL HYSTERECTOMY  1971   HAND SURGERY  1980's   left    Family History  Problem Relation Age of Onset   Cancer Mother    Cancer Father    Cancer Brother    Cancer Brother    Cancer Son        prostate-remission    Social History   Socioeconomic History   Marital status: Widowed    Spouse name: Not on file   Number of children: Not on file   Years of education: Not on file   Highest education level: Not on file  Occupational History   Not on file  Tobacco Use   Smoking status: Never   Smokeless tobacco: Never  Vaping Use   Vaping status: Never Used  Substance and Sexual Activity   Alcohol use: No   Drug use: No   Sexual activity: Not on file  Other Topics Concern   Not on file  Social History Narrative   Not on file   Social Drivers of  Health   Financial Resource Strain: Low Risk  (01/28/2023)   Overall Financial Resource Strain (CARDIA)    Difficulty of Paying Living Expenses: Not hard at all  Food Insecurity: No Food Insecurity (03/11/2023)   Hunger Vital Sign    Worried About Running Out of Food in the Last Year: Never true    Ran Out of Food in the Last Year: Never true  Transportation Needs: No Transportation Needs (03/11/2023)   PRAPARE - Administrator, Civil Service (Medical): No    Lack of Transportation (Non-Medical): No  Physical Activity: Insufficiently Active (01/28/2023)   Exercise Vital Sign    Days of Exercise per Week: 2 days    Minutes of Exercise per Session: 20 min  Stress: No Stress Concern Present (01/28/2023)   [employer] of Occupational Health - Occupational Stress Questionnaire    Feeling of Stress : Not at all  Social Connections: Moderately Integrated (03/11/2023)   Social Connection and Isolation Panel    Frequency of Communication with Friends and Family: More than three times a week    Frequency of Social Gatherings with Friends and Family:  More than three times a week    Attends Religious Services: More than 4 times per year    Active Member of Clubs or Organizations: Yes    Attends Banker Meetings: Never    Marital Status: Widowed  Recent Concern: Social Connections - Moderately Isolated (01/28/2023)   Social Connection and Isolation Panel    Frequency of Communication with Friends and Family: More than three times a week    Frequency of Social Gatherings with Friends and Family: More than three times a week    Attends Religious Services: More than 4 times per year    Active Member of [employer] or Organizations: No    Attends Banker Meetings: Never    Marital Status: Widowed  Intimate Partner Violence: Not At Risk (01/28/2023)   Humiliation, Afraid, Rape, and Kick questionnaire    Fear of Current or Ex-Partner: No    Emotionally Abused: No     Physically Abused: No    Sexually Abused: No    Outpatient Medications Prior to Visit  Medication Sig Dispense Refill   atorvastatin  (LIPITOR) 20 MG tablet Take 1 tablet (20 mg total) by mouth daily. 90 tablet 3   B Complex Vitamins (VITAMIN B COMPLEX) TABS as directed Orally     calcium -vitamin D  (OSCAL WITH D) 500-200 MG-UNIT per tablet Take 1 tablet by mouth 2 (two) times daily.     carboxymethylcellulose (REFRESH PLUS) 0.5 % SOLN Place 1 drop into both eyes 2 (two) times daily as needed (dry eyes).     Cholecalciferol (VITAMIN D3) 50 MCG (2000 UT) capsule 1 capsule Orally Once a day     dorzolamide-timolol (COSOPT) 2-0.5 % ophthalmic solution Place 1 drop into the right eye 2 (two) times daily.     folic acid (FOLVITE) 1 MG tablet 1 tablet Orally Once a day for 90 days     HYDROcodone -acetaminophen  (NORCO/VICODIN) 5-325 MG tablet Take 2 tablets by mouth every 6 (six) hours as needed for moderate pain.     latanoprost (XALATAN) 0.005 % ophthalmic solution Place 1 drop into both eyes at bedtime.      methotrexate  (RHEUMATREX) 2.5 MG tablet Take 4 tablets (10 mg total) by mouth once a week. Caution:Chemotherapy. Protect from light. 4 tablet 0   potassium chloride  SA (KLOR-CON  M) 20 MEQ tablet Take 1 tablet (20 mEq total) by mouth daily. 30 tablet 2   triamcinolone  cream (KENALOG ) 0.1 % Apply 1 Application topically 2 (two) times daily. 30 g 0   warfarin (COUMADIN ) 5 MG tablet Take 0.5 tablets (2.5 mg total) by mouth daily. 20 tablet 3   No facility-administered medications prior to visit.    No Known Allergies  Review of Systems  Constitutional:  Positive for fatigue. Negative for chills and fever.  HENT:  Positive for congestion and sore throat.   Eyes:  Negative for pain and discharge.  Respiratory:  Positive for cough and shortness of breath.   Cardiovascular:  Negative for chest pain and palpitations.  Gastrointestinal:  Negative for abdominal pain, diarrhea, nausea and  vomiting.  Endocrine: Negative for polydipsia and polyuria.  Genitourinary:  Negative for dysuria and hematuria.  Musculoskeletal:  Positive for arthralgias and myalgias. Negative for neck pain and neck stiffness.  Skin:  Negative for rash.  Neurological:  Positive for weakness. Negative for dizziness.  Psychiatric/Behavioral:  Negative for agitation and behavioral problems.        Objective:    Physical Exam Vitals reviewed.  Constitutional:  General: She is not in acute distress.    Appearance: She is underweight. She is not diaphoretic.  HENT:     Head: Normocephalic and atraumatic.     Nose: Nose normal.     Mouth/Throat:     Mouth: Mucous membranes are moist.  Eyes:     General: No scleral icterus.    Extraocular Movements: Extraocular movements intact.  Cardiovascular:     Rate and Rhythm: Normal rate and regular rhythm.     Heart sounds: Normal heart sounds. No murmur heard. Pulmonary:     Breath sounds: No wheezing or rales.  Musculoskeletal:     Cervical back: Neck supple. No tenderness.     Right lower leg: No edema.     Left lower leg: No edema.  Skin:    General: Skin is warm.     Findings: No rash.  Neurological:     General: No focal deficit present.     Mental Status: She is alert and oriented to person, place, and time.  Psychiatric:        Mood and Affect: Mood normal.        Behavior: Behavior normal.     BP 138/70 (BP Location: Left Arm)   Pulse 90   Ht 5' 6 (1.676 m)   Wt 112 lb 6.4 oz (51 kg)   SpO2 93%   BMI 18.14 kg/m  Wt Readings from Last 3 Encounters:  12/31/23 112 lb 6.4 oz (51 kg)  12/03/23 111 lb (50.3 kg)  09/03/23 109 lb 9.6 oz (49.7 kg)        Assessment & Plan:   Problem List Items Addressed This Visit       Cardiovascular and Mediastinum   Pulmonary embolism (HCC)   Closely followed by coumadin  clinic. Remains on Warfarin. Last INR: 1.8 CBC reviewed, no concern for anemia currently        Respiratory    Acute bronchitis - Primary   Considering cough and mild dyspnea, started Medrol  Dosepak, can also help with myalgias and arthralgias Check flu, COVID and RSV testing Advised to maintain adequate hydration Robitussin as needed for cough       Relevant Medications   methylPREDNISolone  (MEDROL  DOSEPAK) 4 MG TBPK tablet   Other Relevant Orders   COVID-19, Flu A+B and RSV     Musculoskeletal and Integument   Rheumatoid arthritis (HCC)   On methotrexate , followed by [facility] rheumatology Her chronic fatigue is likely due to RA Recent CBC and CMP reviewed, overall unremarkable      Relevant Medications   methylPREDNISolone  (MEDROL  DOSEPAK) 4 MG TBPK tablet     Other   Chronic fatigue   Current worsening of fatigue could be due to viral infection/acute bronchitis Likely multifactorial, due to OA, advanced age and hydration Needs to maintain at least 50 ounces of fluid intake in a day Continue potassium supplement Recent CBC and CMP reviewed, overall unremarkable Would consider checking TSH in next blood tests        Meds ordered this encounter  Medications   methylPREDNISolone  (MEDROL  DOSEPAK) 4 MG TBPK tablet    Sig: Take as package instructions.    Dispense:  1 each    Refill:  0     Gifford Ballon MARLA Blanch, MD

## 2023-12-31 NOTE — Assessment & Plan Note (Signed)
 On methotrexate , followed by [facility] rheumatology Her chronic fatigue is likely due to RA Recent CBC and CMP reviewed, overall unremarkable

## 2023-12-31 NOTE — Patient Instructions (Signed)
 Please take Prednisone as prescribed.  Please take Robitussin as needed for cough.  Please maintain at least 50 ounces of fluid intake in a day.

## 2023-12-31 NOTE — Assessment & Plan Note (Signed)
 Closely followed by coumadin  clinic. Remains on Warfarin. Last INR: 1.8 CBC reviewed, no concern for anemia currently

## 2024-01-02 ENCOUNTER — Ambulatory Visit: Payer: Self-pay | Admitting: Internal Medicine

## 2024-01-02 LAB — COVID-19, FLU A+B AND RSV
Influenza A, NAA: NOT DETECTED
Influenza B, NAA: NOT DETECTED
RSV, NAA: NOT DETECTED
SARS-CoV-2, NAA: NOT DETECTED

## 2024-02-02 ENCOUNTER — Ambulatory Visit: Attending: Internal Medicine | Admitting: *Deleted

## 2024-02-02 ENCOUNTER — Ambulatory Visit (INDEPENDENT_AMBULATORY_CARE_PROVIDER_SITE_OTHER)

## 2024-02-02 VITALS — Ht 66.0 in | Wt 112.0 lb

## 2024-02-02 DIAGNOSIS — Z0000 Encounter for general adult medical examination without abnormal findings: Secondary | ICD-10-CM

## 2024-02-02 DIAGNOSIS — I2782 Chronic pulmonary embolism: Secondary | ICD-10-CM | POA: Diagnosis present

## 2024-02-02 DIAGNOSIS — Z5181 Encounter for therapeutic drug level monitoring: Secondary | ICD-10-CM | POA: Diagnosis present

## 2024-02-02 DIAGNOSIS — I2609 Other pulmonary embolism with acute cor pulmonale: Secondary | ICD-10-CM | POA: Insufficient documentation

## 2024-02-02 LAB — POCT INR: INR: 1.9 — AB (ref 2.0–3.0)

## 2024-02-02 NOTE — Progress Notes (Signed)
 INR 1.9; Please see anticoagulation encounter

## 2024-02-02 NOTE — Patient Instructions (Signed)
 Cheryl Swanson,  Thank you for taking the time for your Medicare Wellness Visit. I appreciate your continued commitment to your health goals. Please review the care plan we discussed, and feel free to reach out if I can assist you further.  Medicare recommends these wellness visits once per year to help you and your care team stay ahead of potential health issues. These visits are designed to focus on prevention, allowing your provider to concentrate on managing your acute and chronic conditions during your regular appointments.  Please note that Annual Wellness Visits do not include a physical exam. Some assessments may be limited, especially if the visit was conducted virtually. If needed, we may recommend a separate in-person follow-up with your provider.  Ongoing Care Seeing your primary care provider every 3 to 6 months helps us  monitor your health and provide consistent, personalized care. Next office visit on 04/05/2024.  You are due for a Flu vaccine and a Pneumonia vaccine, which can be done here, in office during your next office visit or at the pharmacy.  You are also due for a Tetanus vaccine and a Shingles vaccine, which has to be done at your local pharmacy.  Referrals If a referral was made during today's visit and you haven't received any updates within two weeks, please contact the referred provider directly to check on the status.  Recommended Screenings:  Health Maintenance  Topic Date Due   Zoster (Shingles) Vaccine (1 of 2) Never done   Pneumococcal Vaccine for age over 23 (2 of 2 - PCV) 02/18/2013   DTaP/Tdap/Td vaccine (2 - Tdap) 10/23/2016   Flu Shot  12/19/2023   COVID-19 Vaccine (4 - 2025-26 season) 01/19/2024   Medicare Annual Wellness Visit  01/28/2024   DEXA scan (bone density measurement)  Completed   HPV Vaccine  Aged Out   Meningitis B Vaccine  Aged Out       02/02/2024    8:44 AM  Advanced Directives  Does Patient Have a Medical Advance Directive? Yes   Type of Estate agent of [place];Living will  Does patient want to make changes to medical advance directive? No - Patient declined  Copy of Healthcare Power of Attorney in Chart? Yes - validated most recent copy scanned in chart (See row information)   Advance Care Planning is important because it: Ensures you receive medical care that aligns with your values, goals, and preferences. Provides guidance to your family and loved ones, reducing the emotional burden of decision-making during critical moments.  Vision: Annual vision screenings are recommended for early detection of glaucoma, cataracts, and diabetic retinopathy. These exams can also reveal signs of chronic conditions such as diabetes and high blood pressure.  Dental: Annual dental screenings help detect early signs of oral cancer, gum disease, and other conditions linked to overall health, including heart disease and diabetes.  Please see the attached documents for additional preventive care recommendations.

## 2024-02-02 NOTE — Progress Notes (Signed)
 Subjective:   Cheryl Swanson is a 89 y.o. who presents for a Medicare Wellness preventive visit.  As a reminder, Annual Wellness Visits don't include a physical exam, and some assessments may be limited, especially if this visit is performed virtually. We may recommend an in-person follow-up visit with your provider if needed.  Visit Complete: Virtual I connected with  Cheryl Swanson on 02/02/24 by a audio enabled telemedicine application and verified that I am speaking with the correct person using two identifiers.  Patient Location: Home  Provider Location: Home Office  I discussed the limitations of evaluation and management by telemedicine. The patient expressed understanding and agreed to proceed.  Vital Signs: Because this visit was a virtual/telehealth visit, some criteria may be missing or patient reported. Any vitals not documented were not able to be obtained and vitals that have been documented are patient reported.  VideoDeclined- This patient declined Librarian, academic. Therefore the visit was completed with audio only.  Persons Participating in Visit: Patient.  AWV Questionnaire: No: Patient Medicare AWV questionnaire was not completed prior to this visit.  Cardiac Risk Factors include: advanced age (>52men, >26 women);dyslipidemia     Objective:    Today's Vitals   02/02/24 0838  Weight: 112 lb (50.8 kg)  Height: 5' 6 (1.676 m)   Body mass index is 18.08 kg/m.     02/02/2024    8:44 AM 01/28/2023    8:27 AM 09/23/2022    7:45 AM 11/29/2021    5:59 PM 10/02/2021    1:53 PM 04/04/2020    3:15 PM 09/28/2019    3:35 PM  Advanced Directives  Does Patient Have a Medical Advance Directive? Yes Yes No No No No No  Type of Estate agent of [place];Living will Healthcare Power of [place];Living will       Does patient want to make changes to medical advance directive? No - Patient declined No - Patient declined        Copy of Healthcare Power of Attorney in Chart? Yes - validated most recent copy scanned in chart (See row information) Yes - validated most recent copy scanned in chart (See row information)       Would patient like information on creating a medical advance directive?   No - Patient declined  No - Patient declined No - Patient declined No - Patient declined    Current Medications (verified) Outpatient Encounter Medications as of 02/02/2024  Medication Sig   atorvastatin  (LIPITOR) 20 MG tablet Take 1 tablet (20 mg total) by mouth daily.   B Complex Vitamins (VITAMIN B COMPLEX) TABS as directed Orally   calcium -vitamin D  (OSCAL WITH D) 500-200 MG-UNIT per tablet Take 1 tablet by mouth 2 (two) times daily.   carboxymethylcellulose (REFRESH PLUS) 0.5 % SOLN Place 1 drop into both eyes 2 (two) times daily as needed (dry eyes).   Cholecalciferol (VITAMIN D3) 50 MCG (2000 UT) capsule 1 capsule Orally Once a day   dorzolamide-timolol (COSOPT) 2-0.5 % ophthalmic solution Place 1 drop into the right eye 2 (two) times daily.   folic acid (FOLVITE) 1 MG tablet 1 tablet Orally Once a day for 90 days   HYDROcodone -acetaminophen  (NORCO/VICODIN) 5-325 MG tablet Take 2 tablets by mouth every 6 (six) hours as needed for moderate pain.   latanoprost (XALATAN) 0.005 % ophthalmic solution Place 1 drop into both eyes at bedtime.    methotrexate  (RHEUMATREX) 2.5 MG tablet Take 4 tablets (10 mg  total) by mouth once a week. Caution:Chemotherapy. Protect from light.   methylPREDNISolone  (MEDROL  DOSEPAK) 4 MG TBPK tablet Take as package instructions.   potassium chloride  SA (KLOR-CON  M) 20 MEQ tablet Take 1 tablet (20 mEq total) by mouth daily.   triamcinolone  cream (KENALOG ) 0.1 % Apply 1 Application topically 2 (two) times daily.   warfarin (COUMADIN ) 5 MG tablet Take 0.5 tablets (2.5 mg total) by mouth daily.   No facility-administered encounter medications on file as of 02/02/2024.    Allergies (verified) Patient  has no known allergies.   History: Past Medical History:  Diagnosis Date   Arthritis    COPD (chronic obstructive pulmonary disease) (HCC)    Glaucoma    Mitral valve disorder    Peptic ulcer    Rheumatoid arthritis(714.0)    Tricuspid valve disorder    Past Surgical History:  Procedure Laterality Date   ABDOMINAL HYSTERECTOMY  1971   HAND SURGERY  1980's   left   Family History  Problem Relation Age of Onset   Cancer Mother    Cancer Father    Cancer Brother    Cancer Brother    Cancer Son        prostate-remission   Social History   Socioeconomic History   Marital status: Widowed    Spouse name: Not on file   Number of children: Not on file   Years of education: Not on file   Highest education level: Not on file  Occupational History   Occupation: RETIRED  Tobacco Use   Smoking status: Never   Smokeless tobacco: Never  Vaping Use   Vaping status: Never Used  Substance and Sexual Activity   Alcohol use: No   Drug use: No   Sexual activity: Not on file  Other Topics Concern   Not on file  Social History Narrative   Patient's daughter and son-in-law stay with pt in pm/2025   Patient alone in the day time   Social Drivers of Health   Financial Resource Strain: Medium Risk (02/02/2024)   Overall Financial Resource Strain (CARDIA)    Difficulty of Paying Living Expenses: Somewhat hard  Food Insecurity: No Food Insecurity (02/02/2024)   Hunger Vital Sign    Worried About Running Out of Food in the Last Year: Never true    Ran Out of Food in the Last Year: Never true  Transportation Needs: No Transportation Needs (02/02/2024)   PRAPARE - Administrator, Civil Service (Medical): No    Lack of Transportation (Non-Medical): No  Physical Activity: Inactive (02/02/2024)   Exercise Vital Sign    Days of Exercise per Week: 0 days    Minutes of Exercise per Session: 0 min  Stress: No Stress Concern Present (02/02/2024)   [employer] of Occupational  Health - Occupational Stress Questionnaire    Feeling of Stress: Not at all  Social Connections: Socially Isolated (02/02/2024)   Social Connection and Isolation Panel    Frequency of Communication with Friends and Family: More than three times a week    Frequency of Social Gatherings with Friends and Family: Three times a week    Attends Religious Services: Never    Active Member of Clubs or Organizations: No    Attends Banker Meetings: Never    Marital Status: Widowed    Tobacco Counseling Counseling given: Not Answered    Clinical Intake:  Pre-visit preparation completed: Yes  Pain :  (rheumatoid arthritis)     BMI -  recorded: 18.08 Nutritional Status: BMI <19  Underweight Nutritional Risks: None Diabetes: No  No results found for: HGBA1C   How often do you need to have someone help you when you read instructions, pamphlets, or other written materials from your doctor or pharmacy?: 1 - Never  Interpreter Needed?: No  Information entered by :: Fia Hebert, RMA   Activities of Daily Living     02/02/2024    8:40 AM  In your present state of health, do you have any difficulty performing the following activities:  Hearing? 1  Comment lost hearing in rt ear-per pt  Vision? 1  Comment has Glaucoma  Difficulty concentrating or making decisions? 0  Walking or climbing stairs? 0  Dressing or bathing? 1  Comment daughter helps  Doing errands, shopping? 0  Comment Her daughter and son-in-law drives her around  Quarry manager and eating ? N  Using the Toilet? N  In the past six months, have you accidently leaked urine? N  Do you have problems with loss of bowel control? N  Managing your Medications? N  Managing your Finances? N  Housekeeping or managing your Housekeeping? N    Patient Care Team: Bevely Doffing, FNP as PCP - General (Family Medicine) Okey Vina GAILS, MD as PCP - Cardiology (Cardiology) Alverda Mardy SAUNDERS, MD as Consulting  Physician (Rheumatology) Anthon Delinda Faden, MD (Inactive) (Ophthalmology) Johnson Reusing, PA-C as Physician Assistant (Orthopedic Surgery)  I have updated your Care Teams any recent Medical Services you may have received from other providers in the past year.     Assessment:   This is a routine wellness examination for Eryca.  Hearing/Vision screen Hearing Screening - Comments:: lost hearing in rt ear-per pt Vision Screening - Comments:: Has Glaucoma/Dr. Alana Fonder [facility] for [facility] care   Goals Addressed             This Visit's Progress    Patient Stated   On track    Remain as active and independent as I can       Depression Screen     02/02/2024    8:47 AM 12/31/2023    1:43 PM 12/03/2023    3:30 PM 09/03/2023    2:55 PM 06/05/2023    2:42 PM 03/04/2023    2:39 PM 01/28/2023    8:39 AM  PHQ 2/9 Scores  PHQ - 2 Score 0 0 0 0 2 2 0  PHQ- 9 Score 1 0 1 1 6 6      Fall Risk     02/02/2024    8:45 AM 12/31/2023    1:42 PM 12/03/2023    3:30 PM 09/03/2023    2:55 PM 06/05/2023    2:41 PM  Fall Risk   Falls in the past year? 0 0 0 0 0  Number falls in past yr: 0 0  0 0  Injury with Fall? 0 0  0 0  Risk for fall due to :  No Fall Risks Impaired balance/gait;Impaired mobility No Fall Risks Impaired balance/gait;Other (Comment)  Risk for fall due to: Comment     age  Follow up Falls evaluation completed;Falls prevention discussed Falls evaluation completed Falls evaluation completed Falls evaluation completed Falls evaluation completed;Education provided;Falls prevention discussed    MEDICARE RISK AT HOME:  Medicare Risk at Home Any stairs in or around the home?: Yes (Has a basement but does not go down) If so, are there any without handrails?: No Home free of loose throw rugs  in walkways, pet beds, electrical cords, etc?: Yes Adequate lighting in your home to reduce risk of falls?: Yes Life alert?: No Use of a cane, walker or w/c?: Yes Grab  bars in the bathroom?: Yes Shower chair or bench in shower?: Yes Elevated toilet seat or a handicapped toilet?: Yes  TIMED UP AND GO:  Was the test performed?  No  Cognitive Function: Declined/Normal: No cognitive concerns noted by patient or family. Patient alert, oriented, able to answer questions appropriately and recall recent events. No signs of memory loss or confusion.        01/28/2023    8:37 AM  6CIT Screen  What Year? 0 points  What month? 0 points  What time? 0 points  Count back from 20 0 points  Months in reverse 0 points  Repeat phrase 6 points  Total Score 6 points    Immunizations Immunization History  Administered Date(s) Administered   Fluad Trivalent(High Dose 65+) 03/04/2023   Influenza-Unspecified 01/19/2020   Moderna Sars-Covid-2 Vaccination 06/26/2019, 07/27/2019, 03/24/2020   Pneumococcal Polysaccharide-23 02/19/2012   Td 10/24/2006    Screening Tests Health Maintenance  Topic Date Due   Zoster Vaccines- Shingrix (1 of 2) Never done   Pneumococcal Vaccine: 50+ Years (2 of 2 - PCV) 02/18/2013   DTaP/Tdap/Td (2 - Tdap) 10/23/2016   Influenza Vaccine  12/19/2023   COVID-19 Vaccine (4 - 2025-26 season) 01/19/2024   Medicare Annual Wellness (AWV)  01/28/2024   DEXA SCAN  Completed   HPV VACCINES  Aged Out   Meningococcal B Vaccine  Aged Out    Health Maintenance Items Addressed: See Nurse Notes at the end of this note  Additional Screening:  Vision Screening: Recommended annual ophthalmology exams for early detection of glaucoma and other disorders of the eye. Is the patient up to date with their annual eye exam?  No  Who is the provider or what is the name of the office in which the patient attends annual eye exams? [facility]  Dental Screening: Recommended annual dental exams for proper oral hygiene  Community Resource Referral / Chronic Care Management: CRR required this visit?  No   CCM required this visit?   No   Plan:    I have personally reviewed and noted the following in the patient's chart:   Medical and social history Use of alcohol, tobacco or illicit drugs  Current medications and supplements including opioid prescriptions. Patient is not currently taking opioid prescriptions. Functional ability and status Nutritional status Physical activity Advanced directives List of other physicians Hospitalizations, surgeries, and ER visits in previous 12 months Vitals Screenings to include cognitive, depression, and falls Referrals and appointments  In addition, I have reviewed and discussed with patient certain preventive protocols, quality metrics, and best practice recommendations. A written personalized care plan for preventive services as well as general preventive health recommendations were provided to patient.   Akshat Minehart L Mariateresa Batra, CMA   02/02/2024   After Visit Summary: (Mail) Due to this being a telephonic visit, the after visit summary with patients personalized plan was offered to patient via mail   Notes: Patient is due for a Pneumonia, Flu, Tdap, and shingles vaccine.  She had no other concerns to address today.

## 2024-02-02 NOTE — Patient Instructions (Signed)
 Increase warfarin to 1/2 tablet daily except 1 tablet on Mondays Recheck in 6 wks

## 2024-02-03 ENCOUNTER — Other Ambulatory Visit: Payer: Self-pay

## 2024-03-15 ENCOUNTER — Ambulatory Visit: Attending: Internal Medicine | Admitting: *Deleted

## 2024-03-15 DIAGNOSIS — I2782 Chronic pulmonary embolism: Secondary | ICD-10-CM | POA: Insufficient documentation

## 2024-03-15 DIAGNOSIS — Z5181 Encounter for therapeutic drug level monitoring: Secondary | ICD-10-CM | POA: Diagnosis present

## 2024-03-15 DIAGNOSIS — I2609 Other pulmonary embolism with acute cor pulmonale: Secondary | ICD-10-CM | POA: Diagnosis present

## 2024-03-15 LAB — POCT INR: INR: 2.5 (ref 2.0–3.0)

## 2024-03-15 NOTE — Progress Notes (Signed)
 INR 2.5. Please see anticoagulation encounter

## 2024-03-15 NOTE — Patient Instructions (Signed)
 Increase warfarin to 1/2 tablet daily except 1 tablet on Mondays Recheck in 6 wks

## 2024-03-31 ENCOUNTER — Ambulatory Visit: Payer: Medicare Other

## 2024-04-05 ENCOUNTER — Ambulatory Visit (INDEPENDENT_AMBULATORY_CARE_PROVIDER_SITE_OTHER): Payer: Self-pay

## 2024-04-05 VITALS — BP 132/80 | HR 95 | Ht 66.0 in | Wt 112.0 lb

## 2024-04-05 DIAGNOSIS — Z23 Encounter for immunization: Secondary | ICD-10-CM

## 2024-04-05 DIAGNOSIS — R5382 Chronic fatigue, unspecified: Secondary | ICD-10-CM

## 2024-04-05 NOTE — Progress Notes (Unsigned)
 Established Patient Office Visit  Subjective   Patient ID: Cheryl Swanson, female    DOB: 11-07-1931  Age: 89 y.o. MRN: [id number]  Chief Complaint  Patient presents with   Medical Management of Chronic Issues    4 month follow up     HPI Discussed the use of AI scribe software for clinical note transcription with the patient, who gave verbal consent to proceed.  History of Present Illness    Cheryl Swanson is a 89 year old female with COPD who presents with persistent fatigue.  Fatigue - Persistent fatigue present for several weeks - Fatigue occurs even after minimal exertion, such as walking from bedroom to kitchen  Chronic obstructive pulmonary disease (copd) - COPD diagnosed approximately 5-6 years ago - No history of inhaler or specific COPD treatment - No history of tobacco use - Spouse had COPD and was a smoker  Recent infectious illness - History of COVID-19 infection earlier this year, uncertain timing and testing status - No recent influenza vaccination  Medication use and monitoring - Current methotrexate  therapy with refills from rheumatologist - Regular blood work for PT/INR monitoring due to history of blood clots  Hydration status - Possible inadequate daily water intake     Patient Active Problem List   Diagnosis Date Noted   Acute bronchitis 12/31/2023   Need for influenza vaccination 04/06/2023   Encounter for therapeutic drug monitoring 01/29/2023   DVT, lower extremity, proximal, acute, right (HCC) 01/29/2023   Hyperlipidemia 12/03/2022   Glaucoma 12/03/2022   History of COPD 12/03/2022   Small bowel obstruction (HCC) 09/23/2022   Supratherapeutic INR 09/23/2022   Osteoarthritis of both knees 09/03/2022   Primary open angle glaucoma (POAG) of both eyes, moderate stage 11/01/2021   Pulmonary embolism (HCC) 08/16/2019   Chronic fatigue 10/29/2012   Rheumatoid arthritis (HCC) 10/29/2012    ROS    Objective:     BP 132/80   Pulse 95    Ht 5' 6 (1.676 m)   Wt 112 lb (50.8 kg)   SpO2 93%   BMI 18.08 kg/m  BP Readings from Last 3 Encounters:  04/05/24 132/80  12/31/23 138/70  12/03/23 (!) 144/77   Wt Readings from Last 3 Encounters:  04/05/24 112 lb (50.8 kg)  02/02/24 112 lb (50.8 kg)  12/31/23 112 lb 6.4 oz (51 kg)      Physical Exam Vitals and nursing note reviewed.  Constitutional:      Appearance: Normal appearance.  HENT:     Head: Normocephalic.  Eyes:     Extraocular Movements: Extraocular movements intact.     Pupils: Pupils are equal, round, and reactive to light.  Cardiovascular:     Rate and Rhythm: Normal rate and regular rhythm.  Pulmonary:     Effort: Pulmonary effort is normal.     Breath sounds: Normal breath sounds.  Musculoskeletal:     Cervical back: Normal range of motion and neck supple.  Neurological:     Mental Status: She is alert and oriented to person, place, and time.  Psychiatric:        Mood and Affect: Mood normal.        Thought Content: Thought content normal.     Last CBC Lab Results  Component Value Date   WBC 3.9 04/05/2024   HGB 14.7 04/05/2024   HCT 44.9 04/05/2024   MCV 97 04/05/2024   MCH 31.9 04/05/2024   RDW 14.3 04/05/2024   PLT 308 04/05/2024  Last metabolic panel Lab Results  Component Value Date   GLUCOSE 83 04/05/2024   NA 139 04/05/2024   K 4.3 04/05/2024   CL 103 04/05/2024   CO2 26 04/05/2024   BUN 13 04/05/2024   CREATININE 0.81 04/05/2024   EGFR 69 04/05/2024   CALCIUM  10.5 (H) 04/05/2024   PHOS 2.2 (L) 09/25/2022   PROT 6.9 04/05/2024   ALBUMIN 4.3 04/05/2024   LABGLOB 2.6 04/05/2024   BILITOT 0.9 04/05/2024   ALKPHOS 85 04/05/2024   AST 19 04/05/2024   ALT 8 04/05/2024   ANIONGAP 7 09/25/2022   Last lipids Lab Results  Component Value Date   CHOL 157 09/03/2023   HDL 76 09/03/2023   LDLCALC 70 09/03/2023   TRIG 50 09/03/2023   CHOLHDL 2.1 09/03/2023   Last hemoglobin A1c No results found for: HGBA1C Last  thyroid  functions Lab Results  Component Value Date   TSH 2.200 04/05/2024   FREET4 0.97 04/05/2024   Last vitamin D  Lab Results  Component Value Date   VD25OH 42.4 03/04/2023   Last vitamin B12 and Folate Lab Results  Component Value Date   VITAMINB12 622 04/05/2024   FOLATE >20.0 04/05/2024      The ASCVD Risk score (Arnett DK, et al., 2019) failed to calculate for the following reasons:   The 2019 ASCVD risk score is only valid for ages 39 to 64    Assessment & Plan:   Problem List Items Addressed This Visit       Other   Chronic fatigue - Primary   Likely multifactorial, due to OA, advanced age and hydration Needs to maintain at least 50 ounces of fluid intake in a day Continue potassium supplement - Ordered blood work to evaluate potential causes.      Relevant Orders   CMP14+EGFR (Completed)   CBC with Differential/Platelet (Completed)   B12 and Folate Panel (Completed)   Fe+TIBC+Fer (Completed)   TSH + free T4 (Completed)   Other Visit Diagnoses       Encounter for immunization       Relevant Orders   Flu vaccine HIGH DOSE PF(Fluzone Trivalent) (Completed)       Return in about 6 months (around 10/03/2024) for chronic follow-up with PCP.    Leita Longs, FNP

## 2024-04-06 NOTE — Assessment & Plan Note (Signed)
 Likely multifactorial, due to OA, advanced age and hydration Needs to maintain at least 50 ounces of fluid intake in a day Continue potassium supplement - Ordered blood work to evaluate potential causes.

## 2024-04-07 LAB — CBC WITH DIFFERENTIAL/PLATELET
Basophils Absolute: 0 x10E3/uL (ref 0.0–0.2)
Basos: 1 %
EOS (ABSOLUTE): 0.1 x10E3/uL (ref 0.0–0.4)
Eos: 2 %
Hematocrit: 44.9 % (ref 34.0–46.6)
Hemoglobin: 14.7 g/dL (ref 11.1–15.9)
Immature Grans (Abs): 0 x10E3/uL (ref 0.0–0.1)
Immature Granulocytes: 0 %
Lymphocytes Absolute: 1.4 x10E3/uL (ref 0.7–3.1)
Lymphs: 35 %
MCH: 31.9 pg (ref 26.6–33.0)
MCHC: 32.7 g/dL (ref 31.5–35.7)
MCV: 97 fL (ref 79–97)
Monocytes Absolute: 0.6 x10E3/uL (ref 0.1–0.9)
Monocytes: 15 %
Neutrophils Absolute: 1.9 x10E3/uL (ref 1.4–7.0)
Neutrophils: 47 %
Platelets: 308 x10E3/uL (ref 150–450)
RBC: 4.61 x10E6/uL (ref 3.77–5.28)
RDW: 14.3 % (ref 11.7–15.4)
WBC: 3.9 x10E3/uL (ref 3.4–10.8)

## 2024-04-07 LAB — CMP14+EGFR
ALT: 8 IU/L (ref 0–32)
AST: 19 IU/L (ref 0–40)
Albumin: 4.3 g/dL (ref 3.6–4.6)
Alkaline Phosphatase: 85 IU/L (ref 48–129)
BUN/Creatinine Ratio: 16 (ref 12–28)
BUN: 13 mg/dL (ref 10–36)
Bilirubin Total: 0.9 mg/dL (ref 0.0–1.2)
CO2: 26 mmol/L (ref 20–29)
Calcium: 10.5 mg/dL — ABNORMAL HIGH (ref 8.7–10.3)
Chloride: 103 mmol/L (ref 96–106)
Creatinine, Ser: 0.81 mg/dL (ref 0.57–1.00)
Globulin, Total: 2.6 g/dL (ref 1.5–4.5)
Glucose: 83 mg/dL (ref 70–99)
Potassium: 4.3 mmol/L (ref 3.5–5.2)
Sodium: 139 mmol/L (ref 134–144)
Total Protein: 6.9 g/dL (ref 6.0–8.5)
eGFR: 69 mL/min/1.73 (ref >59)

## 2024-04-07 LAB — IRON,TIBC AND FERRITIN PANEL
Ferritin: 160 ng/mL — AB (ref 15–150)
Iron Saturation: 34 % (ref 15–55)
Iron: 91 ug/dL (ref 27–139)
Total Iron Binding Capacity: 264 ug/dL (ref 250–450)
UIBC: 173 ug/dL (ref 118–369)

## 2024-04-07 LAB — TSH+FREE T4
Free T4: 0.97 ng/dL (ref 0.82–1.77)
TSH: 2.2 u[IU]/mL (ref 0.450–4.500)

## 2024-04-07 LAB — B12 AND FOLATE PANEL
Folate: >20 ng/mL (ref >3.0)
Vitamin B-12: 622 pg/mL (ref 232–1245)

## 2024-04-09 ENCOUNTER — Other Ambulatory Visit: Payer: Self-pay | Admitting: Internal Medicine

## 2024-04-19 ENCOUNTER — Other Ambulatory Visit: Payer: Self-pay

## 2024-04-19 ENCOUNTER — Ambulatory Visit: Payer: Self-pay

## 2024-04-19 ENCOUNTER — Encounter (HOSPITAL_COMMUNITY): Payer: Self-pay

## 2024-04-19 ENCOUNTER — Emergency Department (HOSPITAL_COMMUNITY)

## 2024-04-19 ENCOUNTER — Emergency Department (HOSPITAL_COMMUNITY)
Admission: EM | Admit: 2024-04-19 | Discharge: 2024-04-19 | Disposition: A | Discharge status: Home / Self Care | Attending: Emergency Medicine | Admitting: Emergency Medicine

## 2024-04-19 DIAGNOSIS — J189 Pneumonia, unspecified organism: Secondary | ICD-10-CM | POA: Insufficient documentation

## 2024-04-19 LAB — CBC WITH DIFFERENTIAL/PLATELET
Abs Immature Granulocytes: 0.02 K/uL (ref 0.00–0.07)
Basophils Absolute: 0 K/uL (ref 0.0–0.1)
Basophils Relative: 0 %
Eosinophils Absolute: 0 K/uL (ref 0.0–0.5)
Eosinophils Relative: 0 %
HCT: 43.1 % (ref 36.0–46.0)
Hemoglobin: 14.4 g/dL (ref 12.0–15.0)
Immature Granulocytes: 0 %
Lymphocytes Relative: 13 %
Lymphs Abs: 0.9 K/uL (ref 0.7–4.0)
MCH: 31.9 pg (ref 26.0–34.0)
MCHC: 33.4 g/dL (ref 30.0–36.0)
MCV: 95.6 fL (ref 80.0–100.0)
Monocytes Absolute: 0.6 K/uL (ref 0.1–1.0)
Monocytes Relative: 8 %
Neutro Abs: 5.7 K/uL (ref 1.7–7.7)
Neutrophils Relative %: 79 %
Platelets: 351 K/uL (ref 150–400)
RBC: 4.51 MIL/uL (ref 3.87–5.11)
RDW: 14.6 % (ref 11.5–15.5)
WBC: 7.3 K/uL (ref 4.0–10.5)
nRBC: 0 % (ref 0.0–0.2)

## 2024-04-19 LAB — COMPREHENSIVE METABOLIC PANEL WITH GFR
ALT: 11 U/L (ref 0–44)
AST: 30 U/L (ref 15–41)
Albumin: 4 g/dL (ref 3.5–5.0)
Alkaline Phosphatase: 74 U/L (ref 38–126)
Anion gap: 11 (ref 5–15)
BUN: 9 mg/dL (ref 8–23)
CO2: 22 mmol/L (ref 22–32)
Calcium: 10.3 mg/dL (ref 8.9–10.3)
Chloride: 100 mmol/L (ref 98–111)
Creatinine, Ser: 0.7 mg/dL (ref 0.44–1.00)
GFR, Estimated: >60 mL/min (ref >60)
Glucose, Bld: 108 mg/dL — ABNORMAL HIGH (ref 70–99)
Potassium: 4.3 mmol/L (ref 3.5–5.1)
Sodium: 133 mmol/L — ABNORMAL LOW (ref 135–145)
Total Bilirubin: 1.1 mg/dL (ref 0.0–1.2)
Total Protein: 7.8 g/dL (ref 6.5–8.1)

## 2024-04-19 LAB — PROTIME-INR
INR: 1.9 — ABNORMAL HIGH (ref 0.8–1.2)
Prothrombin Time: 22.4 s — ABNORMAL HIGH (ref 11.4–15.2)

## 2024-04-19 LAB — TROPONIN T, HIGH SENSITIVITY
Troponin T High Sensitivity: <15 ng/L (ref 0–19)
Troponin T High Sensitivity: <15 ng/L (ref 0–19)

## 2024-04-19 MED ORDER — AZITHROMYCIN 250 MG PO TABS
500.0000 mg | ORAL_TABLET | Freq: Once | ORAL | Status: AC
  Administered 2024-04-19: 500 mg via ORAL
  Filled 2024-04-19: qty 2

## 2024-04-19 MED ORDER — AZITHROMYCIN 250 MG PO TABS: 250.0000 mg | ORAL_TABLET | Freq: Every day | ORAL | 0 refills | Status: AC

## 2024-04-19 MED ORDER — AMOXICILLIN-POT CLAVULANATE 875-125 MG PO TABS: 1.0000 | ORAL_TABLET | Freq: Two times a day (BID) | ORAL | 0 refills | Status: DC

## 2024-04-19 MED ORDER — AMOXICILLIN-POT CLAVULANATE 875-125 MG PO TABS
1.0000 | ORAL_TABLET | Freq: Once | ORAL | Status: AC
  Administered 2024-04-19: 1 via ORAL
  Filled 2024-04-19: qty 1

## 2024-04-19 MED ORDER — BENZONATATE 100 MG PO CAPS: 100.0000 mg | ORAL_CAPSULE | Freq: Three times a day (TID) | ORAL | 0 refills | Status: DC

## 2024-04-19 NOTE — Telephone Encounter (Signed)
 Noted ED advised

## 2024-04-19 NOTE — ED Triage Notes (Signed)
 Pt arrived via POV c/o persistent cough and rib cage pain under her right breast for several days. Pt reports having productive cough and reports being advised to seek evaluation in the ER by her PCP for concern of possible pneumonia. Pt reports taking Warfarin for previous blood clot as well.

## 2024-04-19 NOTE — Discharge Instructions (Signed)
 Please be sure to follow-up with your physician to ensure resolution of your pneumonia.  Return here for concerning changes in your condition.

## 2024-04-19 NOTE — ED Provider Notes (Signed)
 [facility] Provider Note   CSN: [id number] Arrival date & time: 04/19/24  1143     Patient presents with: Cough   Cheryl Swanson is a 89 y.o. female.   HPI Female arrives with her adult son who assists with the history.  She presents with concern of cough, right inframammary pain.  Onset 4 days ago, since onset symptoms have been persistent.  No syncope, vomiting, nausea, fever.  She spoke with her physician and was sent here for evaluation.     Prior to Admission medications   Medication Sig Start Date End Date Taking? Authorizing Provider  amoxicillin-clavulanate (AUGMENTIN) 875-125 MG tablet Take 1 tablet by mouth every 12 (twelve) hours. 04/19/24  Yes Garrick Charleston, MD  azithromycin (ZITHROMAX) 250 MG tablet Take 1 tablet (250 mg total) by mouth daily for 4 days. Take 1 every day until finished. 04/19/24 04/23/24 Yes Garrick Charleston, MD  benzonatate  (TESSALON ) 100 MG capsule Take 1 capsule (100 mg total) by mouth every 8 (eight) hours. 04/19/24  Yes Garrick Charleston, MD  atorvastatin  (LIPITOR) 20 MG tablet Take 1 tablet (20 mg total) by mouth daily. 09/08/23   Dixon, Phillip E, MD  B Complex Vitamins (VITAMIN B COMPLEX) TABS as directed Orally    [provider]  calcium -vitamin D  (OSCAL WITH D) 500-200 MG-UNIT per tablet Take 1 tablet by mouth 2 (two) times daily.    [provider]  carboxymethylcellulose (REFRESH PLUS) 0.5 % SOLN Place 1 drop into both eyes 2 (two) times daily as needed (dry eyes).    [provider]  Cholecalciferol (VITAMIN D3) 50 MCG (2000 UT) capsule 1 capsule Orally Once a day    [provider]  dorzolamide-timolol (COSOPT) 2-0.5 % ophthalmic solution Place 1 drop into the right eye 2 (two) times daily. 05/06/22   [provider]  folic acid (FOLVITE) 1 MG tablet 1 tablet Orally Once a day for 90 days 10/30/22   [provider]  HYDROcodone -acetaminophen   (NORCO/VICODIN) 5-325 MG tablet Take 2 tablets by mouth every 6 (six) hours as needed for moderate pain. 09/26/22   Johnson, Clanford L, MD  latanoprost (XALATAN) 0.005 % ophthalmic solution Place 1 drop into both eyes at bedtime.     [provider]  methotrexate  (RHEUMATREX) 2.5 MG tablet Take 4 tablets (10 mg total) by mouth once a week. Caution:Chemotherapy. Protect from light. 12/03/23   Bevely Doffing, FNP  potassium chloride  SA (KLOR-CON  M) 20 MEQ tablet Take 1 tablet (20 mEq total) by mouth daily. 02/03/24   Bevely Doffing, FNP  triamcinolone  cream (KENALOG ) 0.1 % Apply 1 Application topically 2 (two) times daily. 02/11/23   Melvenia Manus BRAVO, MD  warfarin (COUMADIN ) 5 MG tablet TAKE ONE-HALF TABLET BY MOUTH EVERY DAY 04/09/24   Bevely Doffing, FNP    Allergies: Patient has no known allergies.    Review of Systems  Updated Vital Signs BP 125/73 (BP Location: Right Arm)   Pulse (!) 106   Temp 100 F (37.8 C) (Oral)   Resp 15   Ht 1.676 m (5' 6)   Wt 50.8 kg   SpO2 97%   BMI 18.08 kg/m   Physical Exam Vitals and nursing note reviewed.  Constitutional:      General: She is not in acute distress.    Appearance: She is well-developed.  HENT:     Head: Normocephalic and atraumatic.  Eyes:     Conjunctiva/sclera: Conjunctivae normal.  Cardiovascular:  Rate and Rhythm: Normal rate and regular rhythm.  Pulmonary:     Effort: Pulmonary effort is normal. No respiratory distress.     Breath sounds: Normal breath sounds. No stridor.  Abdominal:     General: There is no distension.  Skin:    General: Skin is warm and dry.  Neurological:     Mental Status: She is alert and oriented to person, place, and time.     Cranial Nerves: No cranial nerve deficit.  Psychiatric:        Mood and Affect: Mood normal.     (all labs ordered are listed, but only abnormal results are displayed) Labs Reviewed  PROTIME-INR - Abnormal; Notable for the following components:       Result Value   Prothrombin Time 22.4 (*)    INR 1.9 (*)    All other components within normal limits  COMPREHENSIVE METABOLIC PANEL WITH GFR - Abnormal; Notable for the following components:   Sodium 133 (*)    Glucose, Bld 108 (*)    All other components within normal limits  CBC WITH DIFFERENTIAL/PLATELET  TROPONIN T, HIGH SENSITIVITY  TROPONIN T, HIGH SENSITIVITY    EKG: EKG Interpretation Date/Time:  Monday April 19 2024 12:22:04 EST Ventricular Rate:  110 PR Interval:  184 QRS Duration:  70 QT Interval:  300 QTC Calculation: 406 R Axis:   78  Text Interpretation: Sinus tachycardia Biatrial enlargement Anterior injury pattern Artifact Abnormal ECG Confirmed by Garrick Charleston [phone]) on 04/19/2024 1:56:37 PM  Radiology: [facility] Chest 2 View Result Date: 04/19/2024 CLINICAL DATA:  Cough and shortness of breath.  Right rib pain. EXAM: CHEST - 2 VIEW COMPARISON:  09/23/2022 and CT chest 08/16/2019. FINDINGS: Trachea is midline. Heart size normal. Thoracic aorta is calcified. Lungs are hyperinflated with biapical pleuroparenchymal scarring. Patchy opacification in the right lung base, likely within the right middle and right lower lobes based on the lateral view. Trace right pleural fluid. Left lung is clear. IMPRESSION: 1. Right basilar pneumonia. Followup PA and lateral chest X-ray is recommended in 3-4 weeks following trial of antibiotic therapy to ensure resolution and exclude underlying malignancy. 2. Trace right pleural effusion. Electronically Signed   By: Newell Eke M.D.   On: 04/19/2024 13:59     Procedures   Medications Ordered in the ED  azithromycin [facility]) tablet 500 mg (500 mg Oral Given 04/19/24 1422)  amoxicillin-clavulanate (AUGMENTIN) 875-125 MG per tablet 1 tablet (1 tablet Oral Given 04/19/24 1422)                                    Medical Decision Making Pleasant elderly female arrives with right sided chest pain, tactile fever, mild tachycardia, no  hypotension, she does not meet SIRS criteria, concern for pneumonia versus bronchitis, versus musculoskeletal etiology less like shingles, absent rash, description of pain worse with coughing.   Amount and/or Complexity of Data Reviewed Independent Historian:     Details: Son at bedside External Data Reviewed: notes. Labs:  Decision-making details documented in ED Course. Radiology: independent interpretation performed. Decision-making details documented in ED Course. ECG/medicine tests: independent interpretation performed. Decision-making details documented in ED Course.  Risk Prescription drug management. Decision regarding hospitalization. Diagnosis or treatment significantly limited by social determinants of health.   3:03 PM At bedside I demonstrated the x-ray with right-sided pneumonia to the patient, son, daughter. Labs unremarkable, she remains hemodynamically stable, no evidence  of bacteremia, sepsis. Patient started antibiotics here, with notation of Coumadin  use, patient will follow-up with physician for appropriate ongoing outpatient management of pneumonia.     Final diagnoses:  Community acquired pneumonia of right lower lobe of lung    ED Discharge Orders          Ordered    amoxicillin-clavulanate (AUGMENTIN) 875-125 MG tablet  Every 12 hours        04/19/24 1502    benzonatate  (TESSALON ) 100 MG capsule  Every 8 hours        04/19/24 1502    azithromycin (ZITHROMAX) 250 MG tablet  Daily        04/19/24 1502               Garrick Charleston, MD 04/19/24 1503

## 2024-04-19 NOTE — Telephone Encounter (Signed)
 FYI Only or Action Required?: FYI only for provider: ED advised.  Patient was last seen in primary care on 04/05/2024 by Bevely Doffing, FNP.  Called Nurse Triage reporting Cough and Chest Pain.  Symptoms began several days ago.  Interventions attempted: Prescription medications: coumadin  and Rest, hydration, or home remedies.  Symptoms are: gradually worsening.  Triage Disposition: Go to ED Now (Notify PCP)  Patient/caregiver understands and will follow disposition?: Yes  Copied from CRM [id number]. Topic: Clinical - Red Word Triage >> Apr 19, 2024  9:54 AM Joesph NOVAK wrote: Red Word that prompted transfer to Nurse Triage: Excruciating pain under breast, when patient coughs. Pain started Thursday and now its getting worse. Reason for Disposition  History of prior blood clot in leg or lungs (i.e., deep vein thrombosis, pulmonary embolism)  Answer Assessment - Initial Assessment Questions Cough and pain under her right breast that radiates up to her right shoulder. Concern for pneumonia. Unsure if rib pain but pain is constant. Denies fever, SOB, or dizziness. Denies missed doses of Coumadin . Audibly breathing heavy with pain on the phone.  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 She will have her grandson bring her to the ED for evaluation. Advised EMS/911 if he is unable to bring her. She understands.  1. LOCATION: Where does it hurt?       Under right breast 2. RADIATION: Does the pain go anywhere else? (e.g., into neck, jaw, arms, back)     Under right breast and radiates to the right back/shoulder 3. ONSET: When did the chest pain begin? (Minutes, hours or days)      Thursday  4. PATTERN: Does the pain come and go, or has it been constant since it started?  Does it get worse with exertion?      Constant but worsens with cough  5. DURATION: How long does it last (e.g., seconds, minutes, hours)     Constant since Thursday 6. SEVERITY: How bad is the pain?  (e.g.,  Scale 1-10; mild, moderate, or severe)     10/10 pain  7. CARDIAC RISK FACTORS: Do you have any history of heart problems or risk factors for heart disease? (e.g., angina, prior heart attack; diabetes, high blood pressure, high cholesterol, smoker, or strong family history of heart disease)     PE and DVT hx 8. PULMONARY RISK FACTORS: Do you have any history of lung disease?  (e.g., blood clots in lung, asthma, emphysema, birth control pills)     Hx of PE  9. CAUSE: What do you think is causing the chest pain?     pneumonia 10. OTHER SYMPTOMS: Do you have any other symptoms? (e.g., dizziness, nausea, vomiting, sweating, fever, difficulty breathing, cough)       Cough  Protocols used: Chest Pain-A-AH

## 2024-04-20 ENCOUNTER — Telehealth: Payer: Self-pay

## 2024-04-20 NOTE — Telephone Encounter (Signed)
 Pt stated that the hospital changed the time she takes her Augmentin, and wanted to know if she can take it earlier than 12am, it is supoosed to be taken 12hrs apart so would it be okay to do like 7am and 7p or vise versa ?

## 2024-04-20 NOTE — Telephone Encounter (Signed)
 Yes, it's okay to take it at 7A & 7P

## 2024-04-20 NOTE — Telephone Encounter (Signed)
 Copied from CRM #[id number]. Topic: Clinical - Medication Question >> Apr 20, 2024 12:38 PM Delon T wrote: Reason for CRM: Patient has questions about taking medications given by the ER and changing the time she takes them- [phone]

## 2024-04-21 NOTE — Telephone Encounter (Signed)
 Pt advised with verbal understanding

## 2024-04-26 ENCOUNTER — Telehealth: Payer: Self-pay

## 2024-04-26 ENCOUNTER — Ambulatory Visit

## 2024-04-26 ENCOUNTER — Ambulatory Visit: Payer: Self-pay

## 2024-04-26 NOTE — Telephone Encounter (Signed)
 Copied from CRM [id number]. Topic: General - Other >> Apr 26, 2024  8:28 AM Carlyon D wrote: Reason for CRM:  Please reach out to pt if  medications that were sent over for refill can not be filled again (Meds were prescribed at the hospital). Also PT has hospital F/U 12/22 pt is also asking for a sooner appt please reach out to pt if pt can get in sooner than 12/22.

## 2024-04-26 NOTE — Telephone Encounter (Signed)
 Copied from CRM #[id number]. Topic: Clinical - Medication Refill >> Apr 26, 2024  8:20 AM Carlyon D wrote: Medication: benzonatate  (TESSALON ) 100 MG capsule  amoxicillin -clavulanate (AUGMENTIN ) 875-125 MG tablet   Azithromycin  250 MG       Has the patient contacted their pharmacy? No (Agent: If no, request that the patient contact the pharmacy for the refill. If patient does not wish to contact the pharmacy document the reason why and proceed with request.) (Agent: If yes, when and what did the pharmacy advise?)  This is the patient's preferred pharmacy:  [facility] - [place], [place] - [address] [place] [address] Phone: [phone] Fax: [phone]  Is this the correct pharmacy for this prescription? Yes If no, delete pharmacy and type the correct one.   Has the prescription been filled recently? Yes  Is the patient out of the medication? Yes  Has the patient been seen for an appointment in the last year OR does the patient have an upcoming appointment? Yes  Can we respond through MyChart? No, prefers phone call   Agent: Please be advised that Rx refills may take up to 3 business days. We ask that you follow-up with your pharmacy.

## 2024-04-26 NOTE — Telephone Encounter (Signed)
 FYI Only or Action Required?: Action required by provider: update on patient condition.  Patient was last seen in primary care on 04/05/2024 by Bevely Doffing, FNP.  Called Nurse Triage reporting Cough.  Symptoms began several weeks ago.  Symptoms are: gradually improving.  Triage Disposition: Home Care  Patient/caregiver understands and will follow disposition?: Yes      Reason for Disposition  Cough  Additional Information  Commented on: Answer Assessment    This RN called pt as she requested the following medications:   benzonatate  (TESSALON ) 100 MG capsule   amoxicillin -clavulanate (AUGMENTIN ) 875-125 MG tablet    Azithromycin  250 MG     12/01 ED for pneumonia 12/22 hospital follow up appointment scheduled  Not coughing as much as she was (nonproductive) Denies fever, chest pain, difficulty breathing Fatigue  Protocols used: Cough - Acute Non-Productive-A-AH   Copied from CRM #[id number]. Topic: Clinical - Medication Refill >> Apr 26, 2024  8:20 AM Carlyon D wrote: Medication: benzonatate  (TESSALON ) 100 MG capsule   amoxicillin -clavulanate (AUGMENTIN ) 875-125 MG tablet    Azithromycin  250 MG            Has the patient contacted their pharmacy? No (Agent: If no, request that the patient contact the pharmacy for the refill. If patient does not wish to contact the pharmacy document the reason why and proceed with request.) (Agent: If yes, when and what did the pharmacy advise?)   This is the patient's preferred pharmacy:  [facility] - [place], [place] - [address] [place] [address] Phone: [phone] Fax: [phone]   Is this the correct pharmacy for this prescription? Yes If no, delete pharmacy and type the correct one.    Has the prescription been filled recently? Yes   Is the patient out of the medication? Yes   Has the patient been seen for an appointment in the last year OR does the patient have an  upcoming appointment? Yes   Can we respond through MyChart? No, prefers phone call    Agent: Please be advised that Rx refills may take up to 3 business days. We ask that you follow-up with your pharmacy.

## 2024-04-27 ENCOUNTER — Other Ambulatory Visit: Payer: Self-pay

## 2024-04-27 MED ORDER — AMOXICILLIN-POT CLAVULANATE 875-125 MG PO TABS: 1.0000 | ORAL_TABLET | Freq: Two times a day (BID) | ORAL | 0 refills | Status: AC

## 2024-04-27 MED ORDER — BENZONATATE 100 MG PO CAPS: 100.0000 mg | ORAL_CAPSULE | Freq: Three times a day (TID) | ORAL | 0 refills | Status: AC

## 2024-04-27 NOTE — Telephone Encounter (Signed)
 Additional antibiotic and tessalon  perles sent to [facility]

## 2024-04-27 NOTE — Telephone Encounter (Signed)
 Noted pt advised with verbal understanding

## 2024-04-27 NOTE — Telephone Encounter (Signed)
Refills of medications sent to the pharmacy

## 2024-04-28 ENCOUNTER — Ambulatory Visit: Attending: Internal Medicine

## 2024-04-28 DIAGNOSIS — I2782 Chronic pulmonary embolism: Secondary | ICD-10-CM | POA: Insufficient documentation

## 2024-04-28 DIAGNOSIS — Z5181 Encounter for therapeutic drug level monitoring: Secondary | ICD-10-CM | POA: Diagnosis present

## 2024-04-28 DIAGNOSIS — I2609 Other pulmonary embolism with acute cor pulmonale: Secondary | ICD-10-CM | POA: Insufficient documentation

## 2024-04-28 LAB — POCT INR: INR: 2.3 (ref 2.0–3.0)

## 2024-04-28 NOTE — Patient Instructions (Signed)
Continue warfarin 1/2 tablet daily except 1 tablet on Mondays  Recheck in 6 wks

## 2024-04-28 NOTE — Telephone Encounter (Signed)
 Patient advised.

## 2024-04-28 NOTE — Progress Notes (Signed)
 INR-2.3; Please see anticoagulation encounter

## 2024-04-29 ENCOUNTER — Other Ambulatory Visit: Payer: Self-pay

## 2024-05-10 ENCOUNTER — Ambulatory Visit (INDEPENDENT_AMBULATORY_CARE_PROVIDER_SITE_OTHER): Payer: Self-pay | Admitting: Internal Medicine

## 2024-05-10 ENCOUNTER — Encounter: Payer: Self-pay | Admitting: Internal Medicine

## 2024-05-10 VITALS — BP 129/80 | HR 76 | Ht 66.0 in | Wt 111.0 lb

## 2024-05-10 DIAGNOSIS — R5382 Chronic fatigue, unspecified: Secondary | ICD-10-CM

## 2024-05-10 DIAGNOSIS — Z09 Encounter for follow-up examination after completed treatment for conditions other than malignant neoplasm: Secondary | ICD-10-CM | POA: Diagnosis not present

## 2024-05-10 DIAGNOSIS — M057A Rheumatoid arthritis with rheumatoid factor of other specified site without organ or systems involvement: Secondary | ICD-10-CM

## 2024-05-10 DIAGNOSIS — J189 Pneumonia, unspecified organism: Secondary | ICD-10-CM | POA: Diagnosis not present

## 2024-05-10 NOTE — Assessment & Plan Note (Signed)
 ER chart reviewed, including imaging Has completed Augmentin  and azithromycin  for CAP - symptoms resolved now

## 2024-05-10 NOTE — Progress Notes (Signed)
 "  Established Patient Office Visit  Subjective:  Patient ID: Cheryl Swanson, female    DOB: Apr 18, 1932  Age: 89 y.o. MRN: [id number]  CC:  Chief Complaint  Patient presents with   Follow-up    ER follow up     HPI Cheryl Swanson is a 89 y.o. female with past medical history of DVT, PE, RA and HLD who presents for f/u of recent ER visit on 04/19/24.  She went to ER with complaint of cough and right-sided chest pain for 4 days prior to ER visit.  CXR showed right basilar pneumonia.  She was placed on Augmentin  and azithromycin , which she has completed now.  Denies any episode of fever or chills since the ER visit.  Her cough has resolved now.  She reports chronic fatigue, but denies any weight loss, lack of appetite, LAD or night sweats.  She has history of RA and takes methotrexate  for it.  She takes folic acid regularly as well.  Past Medical History:  Diagnosis Date   Arthritis    COPD (chronic obstructive pulmonary disease) (HCC)    Glaucoma    Mitral valve disorder    Peptic ulcer    Rheumatoid arthritis(714.0)    Tricuspid valve disorder     Past Surgical History:  Procedure Laterality Date   ABDOMINAL HYSTERECTOMY  1971   HAND SURGERY  1980's   left    Family History  Problem Relation Age of Onset   Cancer Mother    Cancer Father    Cancer Brother    Cancer Brother    Cancer Son        prostate-remission    Social History   Socioeconomic History   Marital status: Widowed    Spouse name: Not on file   Number of children: Not on file   Years of education: Not on file   Highest education level: Not on file  Occupational History   Occupation: RETIRED  Tobacco Use   Smoking status: Never   Smokeless tobacco: Never  Vaping Use   Vaping status: Never Used  Substance and Sexual Activity   Alcohol use: No   Drug use: No   Sexual activity: Not on file  Other Topics Concern   Not on file  Social History Narrative   Patient's daughter and son-in-law stay  with pt in pm/2025   Patient alone in the day time   Social Drivers of Health   Tobacco Use: Low Risk (05/10/2024)   Patient History    Smoking Tobacco Use: Never    Smokeless Tobacco Use: Never    Passive Exposure: Not on file  Financial Resource Strain: Medium Risk (02/02/2024)   Overall Financial Resource Strain (CARDIA)    Difficulty of Paying Living Expenses: Somewhat hard  Food Insecurity: No Food Insecurity (02/02/2024)   Epic    Worried About Programme Researcher, Broadcasting/film/video in the Last Year: Never true    Ran Out of Food in the Last Year: Never true  Transportation Needs: No Transportation Needs (02/02/2024)   Epic    Lack of Transportation (Medical): No    Lack of Transportation (Non-Medical): No  Physical Activity: Inactive (02/02/2024)   Exercise Vital Sign    Days of Exercise per Week: 0 days    Minutes of Exercise per Session: 0 min  Stress: No Stress Concern Present (02/02/2024)   [employer] of Occupational Health - Occupational Stress Questionnaire    Feeling of Stress: Not at all  Social  Connections: Socially Isolated (02/02/2024)   Social Connection and Isolation Panel    Frequency of Communication with Friends and Family: More than three times a week    Frequency of Social Gatherings with Friends and Family: Three times a week    Attends Religious Services: Never    Active Member of Clubs or Organizations: No    Attends Banker Meetings: Never    Marital Status: Widowed  Intimate Partner Violence: Patient Unable To Answer (02/02/2024)   Epic    Fear of Current or Ex-Partner: Patient unable to answer    Emotionally Abused: Patient unable to answer    Physically Abused: Patient unable to answer    Sexually Abused: Patient unable to answer  Depression (PHQ2-9): Low Risk (05/10/2024)   Depression (PHQ2-9)    PHQ-2 Score: 3  Alcohol Screen: Low Risk (02/02/2024)   Alcohol Screen    Last Alcohol Screening Score (AUDIT): 0  Housing: Unknown (02/02/2024)    Epic    Unable to Pay for Housing in the Last Year: No    Number of Times Moved in the Last Year: Not on file    Homeless in the Last Year: No  Utilities: Not At Risk (02/02/2024)   Epic    Threatened with loss of utilities: No  Health Literacy: Adequate Health Literacy (02/02/2024)   B1300 Health Literacy    Frequency of need for help with medical instructions: Never    Outpatient Medications Prior to Visit  Medication Sig Dispense Refill   atorvastatin  (LIPITOR) 20 MG tablet Take 1 tablet (20 mg total) by mouth daily. 90 tablet 3   B Complex Vitamins (VITAMIN B COMPLEX) TABS as directed Orally     benzonatate  (TESSALON ) 100 MG capsule Take 1 capsule (100 mg total) by mouth every 8 (eight) hours. 20 capsule 0   calcium -vitamin D  (OSCAL WITH D) 500-200 MG-UNIT per tablet Take 1 tablet by mouth 2 (two) times daily.     carboxymethylcellulose (REFRESH PLUS) 0.5 % SOLN Place 1 drop into both eyes 2 (two) times daily as needed (dry eyes).     Cholecalciferol (VITAMIN D3) 50 MCG (2000 UT) capsule 1 capsule Orally Once a day     dorzolamide-timolol (COSOPT) 2-0.5 % ophthalmic solution Place 1 drop into the right eye 2 (two) times daily.     folic acid (FOLVITE) 1 MG tablet 1 tablet Orally Once a day for 90 days     HYDROcodone -acetaminophen  (NORCO/VICODIN) 5-325 MG tablet Take 2 tablets by mouth every 6 (six) hours as needed for moderate pain.     latanoprost (XALATAN) 0.005 % ophthalmic solution Place 1 drop into both eyes at bedtime.      methotrexate  (RHEUMATREX) 2.5 MG tablet Take 4 tablets (10 mg total) by mouth once a week. Caution:Chemotherapy. Protect from light. 4 tablet 0   potassium chloride  SA (KLOR-CON  M) 20 MEQ tablet Take 1 tablet (20 mEq total) by mouth daily. 30 tablet 2   triamcinolone  cream (KENALOG ) 0.1 % Apply 1 Application topically 2 (two) times daily. 30 g 0   warfarin (COUMADIN ) 5 MG tablet TAKE ONE-HALF TABLET BY MOUTH EVERY DAY 20 tablet 3   No facility-administered  medications prior to visit.    Allergies[1]  ROS Review of Systems  Constitutional:  Positive for fatigue. Negative for chills and fever.  HENT:  Negative for congestion and sore throat.   Eyes:  Negative for pain and discharge.  Respiratory:  Negative for cough and shortness of breath.  Cardiovascular:  Negative for chest pain and palpitations.  Gastrointestinal:  Negative for abdominal pain, diarrhea, nausea and vomiting.  Endocrine: Negative for polydipsia and polyuria.  Genitourinary:  Negative for dysuria and hematuria.  Musculoskeletal:  Positive for arthralgias and myalgias. Negative for neck pain and neck stiffness.  Skin:  Negative for rash.  Neurological:  Positive for weakness. Negative for dizziness.  Psychiatric/Behavioral:  Negative for agitation and behavioral problems.       Objective:    Physical Exam Vitals reviewed.  Constitutional:      General: She is not in acute distress.    Appearance: She is underweight. She is not diaphoretic.  HENT:     Head: Normocephalic and atraumatic.     Nose: Nose normal.     Mouth/Throat:     Mouth: Mucous membranes are moist.  Eyes:     General: No scleral icterus.    Extraocular Movements: Extraocular movements intact.  Cardiovascular:     Rate and Rhythm: Normal rate and regular rhythm.     Heart sounds: Normal heart sounds. No murmur heard. Pulmonary:     Breath sounds: No wheezing or rales.  Musculoskeletal:     Cervical back: Neck supple. No tenderness.     Right lower leg: No edema.     Left lower leg: No edema.     Comments: Ulnar deviation of hand joints and wrist  Skin:    General: Skin is warm.     Findings: No rash.  Neurological:     General: No focal deficit present.     Mental Status: She is alert and oriented to person, place, and time.  Psychiatric:        Mood and Affect: Mood normal.        Behavior: Behavior normal.     BP 129/80   Pulse 76   Ht 5' 6 (1.676 m)   Wt 111 lb (50.3 kg)    SpO2 94%   BMI 17.92 kg/m  Wt Readings from Last 3 Encounters:  05/10/24 111 lb (50.3 kg)  04/19/24 111 lb 15.9 oz (50.8 kg)  04/05/24 112 lb (50.8 kg)    Lab Results  Component Value Date   TSH 2.200 04/05/2024   Lab Results  Component Value Date   WBC 7.3 04/19/2024   HGB 14.4 04/19/2024   HCT 43.1 04/19/2024   MCV 95.6 04/19/2024   PLT 351 04/19/2024   Lab Results  Component Value Date   NA 133 (L) 04/19/2024   K 4.3 04/19/2024   CO2 22 04/19/2024   GLUCOSE 108 (H) 04/19/2024   BUN 9 04/19/2024   CREATININE 0.70 04/19/2024   BILITOT 1.1 04/19/2024   ALKPHOS 74 04/19/2024   AST 30 04/19/2024   ALT 11 04/19/2024   PROT 7.8 04/19/2024   ALBUMIN 4.0 04/19/2024   CALCIUM  10.3 04/19/2024   ANIONGAP 11 04/19/2024   EGFR 69 04/05/2024   Lab Results  Component Value Date   CHOL 157 09/03/2023   Lab Results  Component Value Date   HDL 76 09/03/2023   Lab Results  Component Value Date   LDLCALC 70 09/03/2023   Lab Results  Component Value Date   TRIG 50 09/03/2023   Lab Results  Component Value Date   CHOLHDL 2.1 09/03/2023   No results found for: HGBA1C    Assessment & Plan:   Problem List Items Addressed This Visit       Respiratory   Community acquired pneumonia of right  lower lobe of lung - Primary   Recent ER visit for CAP Has completed oral Augmentin  and azithromycin  Check CXR after 1 week Although her chart reports history of COPD, it is questionable in the absence of smoking history - previous CT chest in 2021 did not show any sign of emphysema      Relevant Orders   DG Chest 2 View     Musculoskeletal and Integument   Rheumatoid arthritis (HCC)   On methotrexate , followed by [facility] rheumatology Her chronic fatigue is likely due to RA Recent CBC and CMP reviewed, overall unremarkable        Other   Chronic fatigue   Likely multifactorial, due to OA, advanced age and hydration Needs to maintain at least 50 ounces of fluid  intake in a day Continue potassium supplement Recent CBC, CMP and TSH reviewed, overall unremarkable Advised to take protein supplement       Encounter for examination following treatment at hospital   ER chart reviewed, including imaging Has completed Augmentin  and azithromycin  for CAP - symptoms resolved now       No orders of the defined types were placed in this encounter.   Follow-up: Return if symptoms worsen or fail to improve.    Suzzane MARLA Blanch, MD     [1] No Known Allergies  "

## 2024-05-10 NOTE — Assessment & Plan Note (Signed)
 On methotrexate , followed by [facility] rheumatology Her chronic fatigue is likely due to RA Recent CBC and CMP reviewed, overall unremarkable

## 2024-05-10 NOTE — Assessment & Plan Note (Addendum)
 Likely multifactorial, due to OA, advanced age and hydration Needs to maintain at least 50 ounces of fluid intake in a day Continue potassium supplement Recent CBC, CMP and TSH reviewed, overall unremarkable Advised to take protein supplement

## 2024-05-10 NOTE — Patient Instructions (Signed)
 Please get chest x-ray after 1 week.  Please continue to take medications as prescribed.  Please continue to follow high protein diet and ambulate as tolerated.

## 2024-05-10 NOTE — Assessment & Plan Note (Signed)
 Recent ER visit for CAP Has completed oral Augmentin  and azithromycin  Check CXR after 1 week Although her chart reports history of COPD, it is questionable in the absence of smoking history - previous CT chest in 2021 did not show any sign of emphysema

## 2024-05-17 NOTE — Progress Notes (Unsigned)
 " Cardiology Office Note:  .   Date:  05/18/2024  ID:  LASHAWNE Swanson, DOB 1932/05/12, MRN [id number] PCP: Bevely Doffing, FNP  [facility] Providers Cardiologist:  Cheryl Gull, MD {  History of Present Illness: .   Cheryl Swanson is a 89 y.o. female  with PMHx of HLD, PE/DVT  (unprovoked in 2021; on coumadin ), mitral stenosis, aorta atherosclerosis, rheumatoid arthritis (On methotrexate , [facility] rheumatology), chronic fatigue (followed by PCP; likely due to RA, advancing age, hydration), COPD, glaucoma who reports to [facility] office for follow up.   Pertinent cardiac medical history:  ETT for chronotropic incompetence in 2014: Normal stress test.  Poor exercise tolerance.  No evidence of chronotropic incompetence or coronary insufficiency. EF preserved since 2013 with most recent Echo 09/2022: EF 55%, mildly elevated PASP with RVSP of 37 mmHg, mild MV regurgitation and stenosis, mild AV calcification, trivial AV regurgitation  Last seen in heartcare 01/23/2023 by Dr. Gull to be established as a new patient for Coumadin  monitoring.  Overall doing well from a cardiology perspective without any complaints.  No med changes.  Continued on Lasix  40 mg daily and KCl 20 mEq daily.  Referred to Coumadin  clinic.   Diagnosed with pneumonia 04/19/2024 in the ED. Discharged with p.o. ABX. EKG showed sinus tachycardia.  Today, patient is accompanied by daughter.  Reports ongoing unchanged fatigue, but otherwise doing well. Denies chest pain, shortness of breath, palpitations, syncope, presyncope, dizziness, orthopnea, PND, swelling or significant weight changes, acute bleeding, or claudication.  Reports compliance with medications.  She is not very active and activity is limited to at home.  She uses a walker to ambulate.  ROS: 10 point review of system has been reviewed and considered negative except ones been listed in the HPI.   Studies Reviewed: .   Echo 2024  1. Left ventricular ejection  fraction, by estimation, is 55%. The left  ventricle has normal function. The left ventricle has no regional wall  motion abnormalities. Left ventricular diastolic parameters are  indeterminate. There is abnormal septal motion.   2. Right ventricular systolic function is normal. The right ventricular  size is normal. There is mildly elevated pulmonary artery systolic  pressure. The estimated right ventricular systolic pressure is 37.3 mmHg.   3. Mild postinflammatory appearance to mitral valve opening. Submitral  chordal calcifications. The mitral valve is abnormal. Mild mitral valve  regurgitation. Mild mitral stenosis. The mean mitral valve gradient is 5.0  mmHg with average heart rate of 71  bpm.   4. The aortic valve is grossly normal. There is mild calcification of the  aortic valve. Aortic valve regurgitation is trivial.   5. The inferior vena cava is dilated in size with <50% respiratory  variability, suggesting right atrial pressure of 15 mmHg.   CV Studies: Cardiac studies reviewed are outlined and summarized above. Otherwise please see EMR for full report.   Physical Exam:   VS:  BP 120/66 (BP Location: Right Arm, Cuff Size: Normal)   Pulse 74   Ht 5' 6 (1.676 m)   Wt 107 lb (48.5 kg)   SpO2 97%   BMI 17.27 kg/m    Wt Readings from Last 3 Encounters:  05/18/24 107 lb (48.5 kg)  05/10/24 111 lb (50.3 kg)  04/19/24 111 lb 15.9 oz (50.8 kg)    GEN: Well nourished, well developed in no acute distress while sitting in chair.  Accompanied by daughter.  NECK: No JVD; No carotid bruits CARDIAC: RRR, no  murmurs, rubs, gallops RESPIRATORY:  Clear to auscultation without rales, wheezing or rhonchi  ABDOMEN: Soft, non-tender, non-distended EXTREMITIES:  No edema; No deformity   ASSESSMENT AND PLAN: .    Sinus tachycardia, resolved Reviewed EKG 04/19/2024: Sinus tachycardia in the setting of pneumonia. Repeat EKG today to obtain baseline: NSR w/ no acute ischemic changes.    Hyponatremia  Encouraged to drink at least 1 Pedialyte daily  for electrolyte supplement.  Order BMP to be completed in 1 week    Latest Ref Rng & Units 04/19/2024   11:25 AM 04/05/2024    2:18 PM 03/04/2023    3:19 PM  BMP  Glucose 70 - 99 mg/dL 891  83  84   BUN 8 - 23 mg/dL 9  13  10    Creatinine 0.44 - 1.00 mg/dL 9.29  9.18  9.27   BUN/Creat Ratio 12 - 28  16  14    Sodium 135 - 145 mmol/L 133  139  140   Potassium 3.5 - 5.1 mmol/L 4.3  4.3  4.1   Chloride 98 - 111 mmol/L 100  103  104   CO2 22 - 32 mmol/L 22  26  24    Calcium  8.9 - 10.3 mg/dL 89.6  89.4  89.8      History of pulmonary embolus (PE) History of DVT (deep vein thrombosis) Continue Coumadin  as prescribed. Continue to follow-up with Coumadin  clinic  Mitral valve stenosis, unspecified etiology Echo 09/2022: Mild MV stenosis and regurgitation Will plan to repeat ECHO in 09/2025  Hyperlipidemia LDL goal <70 LDL at goal as below.  Continue on Lipitor 20 mg.  Previously on simvastatin  but patient self discontinued for unknown reason.   Lipid Panel     Component Value Date/Time   CHOL 157 09/03/2023 1541   TRIG 50 09/03/2023 1541   HDL 76 09/03/2023 1541   CHOLHDL 2.1 09/03/2023 1541   CHOLHDL 2.7 02/26/2021 1412   VLDL 12 02/26/2021 1412   LDLCALC 70 09/03/2023 1541   LABVLDL 11 09/03/2023 1541   Primary hypertension BP this OV well controlled today: 120/66 Previously not on any antihypertensive medications.  Encourage physical activity for 150 minutes per week and heart healthy low sodium diet. Discussed limiting sodium intake to < 2 grams daily.    Atherosclerosis of aorta  Noted on CT  Denies claudication, abdominal pain, back pain, or embolic symptoms. Discussed the importance of managing risk factors as above. Emphasized importance of medication adherence, BP/lipid control, and lifestyle changes.     Dispo: Follow-up in 6 months with Scottie, PA-C.  If no significant concerns at follow-up then  can consider 1 year follow-up.  Signed, Lorette CINDERELLA Kapur, PA-C  "

## 2024-05-18 ENCOUNTER — Ambulatory Visit: Admitting: Physician Assistant

## 2024-05-18 ENCOUNTER — Ambulatory Visit
Admission: RE | Admit: 2024-05-18 | Discharge: 2024-05-18 | Disposition: A | Discharge status: Home / Self Care | Source: Ambulatory Visit | Attending: Internal Medicine | Admitting: Internal Medicine

## 2024-05-18 ENCOUNTER — Encounter: Payer: Self-pay | Admitting: Physician Assistant

## 2024-05-18 VITALS — BP 120/66 | HR 74 | Ht 66.0 in | Wt 107.0 lb

## 2024-05-18 DIAGNOSIS — I050 Rheumatic mitral stenosis: Secondary | ICD-10-CM | POA: Diagnosis not present

## 2024-05-18 DIAGNOSIS — R000 Tachycardia, unspecified: Secondary | ICD-10-CM | POA: Insufficient documentation

## 2024-05-18 DIAGNOSIS — E871 Hypo-osmolality and hyponatremia: Secondary | ICD-10-CM | POA: Diagnosis not present

## 2024-05-18 DIAGNOSIS — J189 Pneumonia, unspecified organism: Secondary | ICD-10-CM | POA: Insufficient documentation

## 2024-05-18 DIAGNOSIS — I10 Essential (primary) hypertension: Secondary | ICD-10-CM | POA: Diagnosis not present

## 2024-05-18 DIAGNOSIS — Z86711 Personal history of pulmonary embolism: Secondary | ICD-10-CM | POA: Insufficient documentation

## 2024-05-18 DIAGNOSIS — E785 Hyperlipidemia, unspecified: Secondary | ICD-10-CM | POA: Insufficient documentation

## 2024-05-18 DIAGNOSIS — I700 Atherosclerosis of aorta: Secondary | ICD-10-CM

## 2024-05-18 DIAGNOSIS — Z86718 Personal history of other venous thrombosis and embolism: Secondary | ICD-10-CM

## 2024-05-18 NOTE — Patient Instructions (Addendum)
 Medication Instructions:    Your physician recommends that you continue on your current medications as directed. Please refer to the Current Medication list given to you today.  *If you need a refill on your cardiac medications before your next appointment, please call your pharmacy*  Lab Work: BMP   If you have labs (blood work) drawn today and your tests are completely normal, you will receive your results only by: MyChart Message (if you have MyChart) OR A paper copy in the mail If you have any lab test that is abnormal or we need to change your treatment, we will call you to review the results.   Follow-Up: At [facility], you and your health needs are our priority.  As part of our continuing mission to provide you with exceptional heart care, our providers are all part of one team.  This team includes your primary Cardiologist (physician) and Advanced Practice Providers or APPs (Physician Assistants and Nurse Practitioners) who all work together to provide you with the care you need, when you need it.  Your next appointment:   6 month(s)  Provider:   You may see Vina Gull, MD or one of the following Advanced Practice Providers on your designated Care Team:   Laymon Qua, PA-C  [place], [place] Olivia [place], [place]     We recommend signing up for the patient portal called MyChart.  Sign up information is provided on this After Visit Summary.  MyChart is used to connect with patients for Virtual Visits (Telemedicine).  Patients are able to view lab/test results, encounter notes, upcoming appointments, etc.  Non-urgent messages can be sent to your provider as well.   To learn more about what you can do with MyChart, go to [email or web address].   Other Instructions Drink fluids with electrolyte like Pediasure or Gatorade.

## 2024-05-26 ENCOUNTER — Ambulatory Visit: Payer: Self-pay | Admitting: Physician Assistant

## 2024-05-26 ENCOUNTER — Other Ambulatory Visit (HOSPITAL_COMMUNITY)
Admission: RE | Admit: 2024-05-26 | Discharge: 2024-05-26 | Disposition: A | Discharge status: Home / Self Care | Source: Ambulatory Visit | Attending: Physician Assistant | Admitting: Physician Assistant

## 2024-05-26 DIAGNOSIS — I050 Rheumatic mitral stenosis: Secondary | ICD-10-CM | POA: Insufficient documentation

## 2024-05-26 DIAGNOSIS — E785 Hyperlipidemia, unspecified: Secondary | ICD-10-CM | POA: Insufficient documentation

## 2024-05-26 DIAGNOSIS — Z86718 Personal history of other venous thrombosis and embolism: Secondary | ICD-10-CM | POA: Insufficient documentation

## 2024-05-26 DIAGNOSIS — Z86711 Personal history of pulmonary embolism: Secondary | ICD-10-CM | POA: Insufficient documentation

## 2024-05-26 DIAGNOSIS — E871 Hypo-osmolality and hyponatremia: Secondary | ICD-10-CM | POA: Insufficient documentation

## 2024-05-26 DIAGNOSIS — I700 Atherosclerosis of aorta: Secondary | ICD-10-CM | POA: Diagnosis present

## 2024-05-26 DIAGNOSIS — I10 Essential (primary) hypertension: Secondary | ICD-10-CM | POA: Diagnosis not present

## 2024-05-26 LAB — BASIC METABOLIC PANEL WITH GFR
Anion gap: 11 (ref 5–15)
BUN: 10 mg/dL (ref 8–23)
CO2: 24 mmol/L (ref 22–32)
Calcium: 10.3 mg/dL (ref 8.9–10.3)
Chloride: 103 mmol/L (ref 98–111)
Creatinine, Ser: 0.66 mg/dL (ref 0.44–1.00)
GFR, Estimated: >60 mL/min
Glucose, Bld: 88 mg/dL (ref 70–99)
Potassium: 4 mmol/L (ref 3.5–5.1)
Sodium: 138 mmol/L (ref 135–145)

## 2024-05-31 ENCOUNTER — Other Ambulatory Visit: Payer: Self-pay | Admitting: Internal Medicine

## 2024-05-31 ENCOUNTER — Ambulatory Visit: Payer: Self-pay | Admitting: Internal Medicine

## 2024-05-31 DIAGNOSIS — R911 Solitary pulmonary nodule: Secondary | ICD-10-CM

## 2024-05-31 DIAGNOSIS — J189 Pneumonia, unspecified organism: Secondary | ICD-10-CM

## 2024-05-31 MED ORDER — LEVOFLOXACIN 500 MG PO TABS: 500.0000 mg | ORAL_TABLET | Freq: Every day | ORAL | 0 refills | Status: AC

## 2024-05-31 NOTE — Addendum Note (Signed)
 Addended byBETHA TOBIE DOWNS on: 05/31/2024 07:53 AM   Modules accepted: Orders

## 2024-06-09 ENCOUNTER — Ambulatory Visit: Attending: Internal Medicine | Admitting: *Deleted

## 2024-06-09 DIAGNOSIS — Z5181 Encounter for therapeutic drug level monitoring: Secondary | ICD-10-CM | POA: Diagnosis present

## 2024-06-09 DIAGNOSIS — I2782 Chronic pulmonary embolism: Secondary | ICD-10-CM | POA: Insufficient documentation

## 2024-06-09 DIAGNOSIS — I2609 Other pulmonary embolism with acute cor pulmonale: Secondary | ICD-10-CM | POA: Insufficient documentation

## 2024-06-09 LAB — POCT INR: INR: 2.1 (ref 2.0–3.0)

## 2024-06-09 NOTE — Progress Notes (Signed)
"  INR 2.1  "

## 2024-06-09 NOTE — Patient Instructions (Signed)
Continue warfarin 1/2 tablet daily except 1 tablet on Mondays  Recheck in 6 wks

## 2024-06-16 ENCOUNTER — Other Ambulatory Visit: Payer: Self-pay

## 2024-06-16 ENCOUNTER — Telehealth: Payer: Self-pay

## 2024-06-16 MED ORDER — CETIRIZINE HCL 10 MG PO TABS: 10.0000 mg | ORAL_TABLET | Freq: Every day | ORAL | 2 refills | Status: AC

## 2024-06-16 NOTE — Telephone Encounter (Signed)
 Medication sent to her pharmacy

## 2024-06-16 NOTE — Telephone Encounter (Signed)
 Copied from CRM #[id number]. Topic: Clinical - Medical Advice >> Jun 16, 2024 12:11 PM Drema MATSU wrote: Reason for CRM: Pt wants to know if Dr. Tobie still wants her to have the X-Ray.

## 2024-06-16 NOTE — Telephone Encounter (Signed)
 Copied from CRM [id number]. Topic: Clinical - Medication Question >> Jun 16, 2024 12:09 PM Drema MATSU wrote: Reason for CRM: Pt had pnemonia and is requesting medication for runny nose and sneezing. She stated her nose is sore from wiping.

## 2024-06-17 NOTE — Telephone Encounter (Signed)
 Pt advised with verbal understanding

## 2024-07-21 ENCOUNTER — Ambulatory Visit

## 2024-08-03 ENCOUNTER — Ambulatory Visit: Payer: Self-pay | Admitting: Internal Medicine

## 2024-10-04 ENCOUNTER — Ambulatory Visit
